# Patient Record
Sex: Female | Born: 1960 | Race: White | Hispanic: No | Smoking: Never smoker
Health system: Southern US, Community
[De-identification: ages and names within clinical notes are randomized; demographics above are authoritative.]

## PROBLEM LIST (undated history)

## (undated) DIAGNOSIS — I10 Essential (primary) hypertension: Secondary | ICD-10-CM

## (undated) DIAGNOSIS — K1379 Other lesions of oral mucosa: Secondary | ICD-10-CM

## (undated) DIAGNOSIS — E119 Type 2 diabetes mellitus without complications: Secondary | ICD-10-CM

## (undated) DIAGNOSIS — E0500 Thyrotoxicosis with diffuse goiter without thyrotoxic crisis or storm: Secondary | ICD-10-CM

## (undated) DIAGNOSIS — M329 Systemic lupus erythematosus, unspecified: Secondary | ICD-10-CM

## (undated) DIAGNOSIS — F329 Major depressive disorder, single episode, unspecified: Secondary | ICD-10-CM

## (undated) DIAGNOSIS — K859 Acute pancreatitis without necrosis or infection, unspecified: Secondary | ICD-10-CM

## (undated) DIAGNOSIS — R011 Cardiac murmur, unspecified: Secondary | ICD-10-CM

## (undated) DIAGNOSIS — F32A Depression, unspecified: Secondary | ICD-10-CM

## (undated) DIAGNOSIS — M797 Fibromyalgia: Secondary | ICD-10-CM

## (undated) DIAGNOSIS — K589 Irritable bowel syndrome without diarrhea: Secondary | ICD-10-CM

## (undated) DIAGNOSIS — N200 Calculus of kidney: Secondary | ICD-10-CM

## (undated) DIAGNOSIS — M1990 Unspecified osteoarthritis, unspecified site: Secondary | ICD-10-CM

## (undated) DIAGNOSIS — G2581 Restless legs syndrome: Secondary | ICD-10-CM

## (undated) DIAGNOSIS — G4730 Sleep apnea, unspecified: Secondary | ICD-10-CM

## (undated) DIAGNOSIS — E7800 Pure hypercholesterolemia, unspecified: Secondary | ICD-10-CM

## (undated) DIAGNOSIS — K219 Gastro-esophageal reflux disease without esophagitis: Secondary | ICD-10-CM

## (undated) DIAGNOSIS — E039 Hypothyroidism, unspecified: Secondary | ICD-10-CM

## (undated) DIAGNOSIS — IMO0002 Reserved for concepts with insufficient information to code with codable children: Secondary | ICD-10-CM

## (undated) HISTORY — PX: CHOLECYSTECTOMY: SHX55

## (undated) HISTORY — DX: Thyrotoxicosis with diffuse goiter without thyrotoxic crisis or storm: E05.00

## (undated) HISTORY — DX: Sleep apnea, unspecified: G47.30

## (undated) HISTORY — DX: Irritable bowel syndrome, unspecified: K58.9

---

## 2001-12-17 ENCOUNTER — Emergency Department (HOSPITAL_COMMUNITY): Admission: EM | Admit: 2001-12-17 | Discharge: 2001-12-18 | Payer: Self-pay

## 2001-12-18 ENCOUNTER — Encounter: Payer: Self-pay | Admitting: Emergency Medicine

## 2001-12-18 ENCOUNTER — Emergency Department (HOSPITAL_COMMUNITY): Admission: EM | Admit: 2001-12-18 | Discharge: 2001-12-19 | Payer: Self-pay | Admitting: Emergency Medicine

## 2002-01-29 ENCOUNTER — Other Ambulatory Visit: Admission: RE | Admit: 2002-01-29 | Discharge: 2002-01-29 | Payer: Self-pay | Admitting: Family Medicine

## 2002-01-30 ENCOUNTER — Other Ambulatory Visit: Admission: RE | Admit: 2002-01-30 | Discharge: 2002-01-30 | Payer: Self-pay | Admitting: Family Medicine

## 2002-11-07 HISTORY — PX: LAPAROSCOPIC CHOLECYSTECTOMY W/ CHOLANGIOGRAPHY: SUR757

## 2003-03-24 ENCOUNTER — Other Ambulatory Visit: Admission: RE | Admit: 2003-03-24 | Discharge: 2003-03-24 | Payer: Self-pay | Admitting: Family Medicine

## 2003-05-21 ENCOUNTER — Encounter: Payer: Self-pay | Admitting: Family Medicine

## 2003-05-21 ENCOUNTER — Ambulatory Visit (HOSPITAL_COMMUNITY): Admission: RE | Admit: 2003-05-21 | Discharge: 2003-05-21 | Payer: Self-pay | Admitting: Family Medicine

## 2003-06-15 ENCOUNTER — Emergency Department (HOSPITAL_COMMUNITY): Admission: EM | Admit: 2003-06-15 | Discharge: 2003-06-15 | Payer: Self-pay | Admitting: Emergency Medicine

## 2003-06-15 ENCOUNTER — Encounter: Payer: Self-pay | Admitting: Emergency Medicine

## 2003-07-07 ENCOUNTER — Inpatient Hospital Stay (HOSPITAL_COMMUNITY): Admission: EM | Admit: 2003-07-07 | Discharge: 2003-07-12 | Payer: Self-pay | Admitting: Emergency Medicine

## 2003-07-07 ENCOUNTER — Encounter: Payer: Self-pay | Admitting: Internal Medicine

## 2003-07-07 ENCOUNTER — Encounter: Payer: Self-pay | Admitting: Emergency Medicine

## 2003-07-11 ENCOUNTER — Encounter (INDEPENDENT_AMBULATORY_CARE_PROVIDER_SITE_OTHER): Payer: Self-pay | Admitting: *Deleted

## 2003-07-11 ENCOUNTER — Encounter: Payer: Self-pay | Admitting: General Surgery

## 2003-07-15 ENCOUNTER — Encounter: Admission: RE | Admit: 2003-07-15 | Discharge: 2003-07-15 | Payer: Self-pay | Admitting: Family Medicine

## 2003-11-08 HISTORY — PX: BILATERAL SALPINGOOPHORECTOMY: SHX1223

## 2003-11-08 HISTORY — PX: SUPRACERVICAL ABDOMINAL HYSTERECTOMY: SHX5393

## 2004-01-02 ENCOUNTER — Emergency Department (HOSPITAL_COMMUNITY): Admission: EM | Admit: 2004-01-02 | Discharge: 2004-01-03 | Payer: Self-pay

## 2004-04-08 ENCOUNTER — Other Ambulatory Visit: Admission: RE | Admit: 2004-04-08 | Discharge: 2004-04-08 | Payer: Self-pay | Admitting: Family Medicine

## 2004-06-22 ENCOUNTER — Inpatient Hospital Stay (HOSPITAL_COMMUNITY): Admission: RE | Admit: 2004-06-22 | Discharge: 2004-06-25 | Payer: Self-pay | Admitting: Obstetrics & Gynecology

## 2004-09-10 ENCOUNTER — Emergency Department (HOSPITAL_COMMUNITY): Admission: EM | Admit: 2004-09-10 | Discharge: 2004-09-10 | Payer: Self-pay | Admitting: Emergency Medicine

## 2004-11-07 HISTORY — PX: COLONOSCOPY: SHX174

## 2004-12-21 ENCOUNTER — Emergency Department (HOSPITAL_COMMUNITY): Admission: EM | Admit: 2004-12-21 | Discharge: 2004-12-21 | Payer: Self-pay | Admitting: Emergency Medicine

## 2004-12-30 ENCOUNTER — Ambulatory Visit (HOSPITAL_COMMUNITY): Admission: RE | Admit: 2004-12-30 | Discharge: 2004-12-30 | Payer: Self-pay | Admitting: Family Medicine

## 2005-01-26 ENCOUNTER — Ambulatory Visit: Payer: Self-pay | Admitting: Internal Medicine

## 2005-04-25 ENCOUNTER — Ambulatory Visit: Payer: Self-pay | Admitting: Internal Medicine

## 2005-04-28 ENCOUNTER — Encounter: Payer: Self-pay | Admitting: Internal Medicine

## 2005-04-28 ENCOUNTER — Ambulatory Visit: Payer: Self-pay | Admitting: Internal Medicine

## 2005-04-28 ENCOUNTER — Ambulatory Visit (HOSPITAL_COMMUNITY): Admission: RE | Admit: 2005-04-28 | Discharge: 2005-04-28 | Payer: Self-pay | Admitting: Internal Medicine

## 2005-06-13 ENCOUNTER — Ambulatory Visit: Payer: Self-pay | Admitting: Internal Medicine

## 2005-10-10 ENCOUNTER — Ambulatory Visit: Payer: Self-pay | Admitting: Internal Medicine

## 2005-11-25 ENCOUNTER — Ambulatory Visit: Payer: Self-pay | Admitting: Internal Medicine

## 2006-01-05 ENCOUNTER — Ambulatory Visit (HOSPITAL_COMMUNITY): Admission: RE | Admit: 2006-01-05 | Discharge: 2006-01-05 | Payer: Self-pay | Admitting: Family Medicine

## 2006-11-21 ENCOUNTER — Ambulatory Visit: Payer: Self-pay | Admitting: Internal Medicine

## 2007-06-04 ENCOUNTER — Ambulatory Visit: Payer: Self-pay | Admitting: Internal Medicine

## 2007-06-17 ENCOUNTER — Emergency Department (HOSPITAL_COMMUNITY): Admission: EM | Admit: 2007-06-17 | Discharge: 2007-06-17 | Payer: Self-pay | Admitting: Emergency Medicine

## 2008-05-27 ENCOUNTER — Ambulatory Visit: Payer: Self-pay | Admitting: Internal Medicine

## 2008-08-22 ENCOUNTER — Ambulatory Visit: Payer: Self-pay | Admitting: Gastroenterology

## 2008-11-21 ENCOUNTER — Encounter: Payer: Self-pay | Admitting: Urgent Care

## 2008-11-21 LAB — CONVERTED CEMR LAB
ALT: 33 units/L (ref 0–35)
AST: 32 units/L (ref 0–37)
Alkaline Phosphatase: 94 units/L (ref 39–117)
Bilirubin, Direct: 0.2 mg/dL (ref 0.0–0.3)
Indirect Bilirubin: 0.4 mg/dL (ref 0.0–0.9)

## 2008-12-10 ENCOUNTER — Encounter (HOSPITAL_COMMUNITY): Admission: RE | Admit: 2008-12-10 | Discharge: 2009-01-09 | Payer: Self-pay | Admitting: Family Medicine

## 2008-12-10 ENCOUNTER — Ambulatory Visit: Payer: Self-pay | Admitting: Internal Medicine

## 2009-02-11 ENCOUNTER — Encounter (HOSPITAL_COMMUNITY): Admission: RE | Admit: 2009-02-11 | Discharge: 2009-03-13 | Payer: Self-pay | Admitting: Obstetrics and Gynecology

## 2009-04-08 ENCOUNTER — Emergency Department (HOSPITAL_COMMUNITY): Admission: EM | Admit: 2009-04-08 | Discharge: 2009-04-08 | Payer: Self-pay | Admitting: Emergency Medicine

## 2009-07-29 DIAGNOSIS — K7689 Other specified diseases of liver: Secondary | ICD-10-CM

## 2009-07-29 DIAGNOSIS — K6289 Other specified diseases of anus and rectum: Secondary | ICD-10-CM

## 2009-07-29 DIAGNOSIS — K219 Gastro-esophageal reflux disease without esophagitis: Secondary | ICD-10-CM | POA: Insufficient documentation

## 2009-07-29 DIAGNOSIS — E0590 Thyrotoxicosis, unspecified without thyrotoxic crisis or storm: Secondary | ICD-10-CM | POA: Insufficient documentation

## 2009-07-29 DIAGNOSIS — R634 Abnormal weight loss: Secondary | ICD-10-CM

## 2009-07-29 DIAGNOSIS — Z8719 Personal history of other diseases of the digestive system: Secondary | ICD-10-CM | POA: Insufficient documentation

## 2009-09-19 ENCOUNTER — Emergency Department (HOSPITAL_COMMUNITY): Admission: EM | Admit: 2009-09-19 | Discharge: 2009-09-19 | Payer: Self-pay | Admitting: Emergency Medicine

## 2009-09-19 ENCOUNTER — Emergency Department (HOSPITAL_COMMUNITY): Admission: EM | Admit: 2009-09-19 | Discharge: 2009-09-19 | Payer: Self-pay | Admitting: Family Medicine

## 2010-02-05 ENCOUNTER — Encounter: Payer: Self-pay | Admitting: Urgent Care

## 2010-03-15 ENCOUNTER — Encounter: Payer: Self-pay | Admitting: Urgent Care

## 2010-03-17 ENCOUNTER — Encounter: Payer: Self-pay | Admitting: Gastroenterology

## 2010-03-18 ENCOUNTER — Encounter: Payer: Self-pay | Admitting: Gastroenterology

## 2010-04-27 ENCOUNTER — Encounter: Payer: Self-pay | Admitting: Internal Medicine

## 2010-07-24 ENCOUNTER — Encounter: Admission: RE | Admit: 2010-07-24 | Discharge: 2010-07-24 | Payer: Self-pay | Admitting: Family Medicine

## 2010-12-07 NOTE — Letter (Signed)
[facility]   Imported By: Rosine Beat 04/27/2010 08:21:55  _____________________________________________________________________  External Attachment:    Type:   Image     Comment:   External Document

## 2010-12-07 NOTE — Medication Information (Signed)
Summary: RX Folder  RX Folder   Imported By: Diana Eves 03/15/2010 13:15:57  _____________________________________________________________________  External Attachment:    Type:   Image     Comment:   External Document  Appended Document: RX Folder denied.  needs ov or get from pcp.

## 2010-12-07 NOTE — Medication Information (Signed)
Summary: Tax adviser   Imported By: Diana Eves 02/05/2010 08:43:58  _____________________________________________________________________  External Attachment:    Type:   Image     Comment:   External Document  Appended Document: RX FolderOmeprazole    Prescriptions: OMEPRAZOLE 20 MG TBEC (OMEPRAZOLE) once daily  #31 x 0   Entered and Authorized by:   Joselyn Arrow FNP-BC   Signed by:   Joselyn Arrow FNP-BC on 02/08/2010   Method used:   [facility]        [facility]  [place] [address]       [address] [place] [address]       [place], [place]  [address]       Ph: [phone] or [phone]       Fax: [phone]   RxID:   [id number]  PT NEEDS OV prior to further RFs.   Appended Document: RX Folder Called pt. Home number disconnected. Called pharmacist, Selena Batten, and she said she will let her know.

## 2010-12-07 NOTE — Medication Information (Signed)
Summary: Tax adviser   Imported By: Diana Eves 03/18/2010 15:21:26  _____________________________________________________________________  External Attachment:    Type:   Image     Comment:   External Document  Appended Document: RX Folder PLEASE LET PHARMACY KNOW THAT THIS WILL BE DENIED UNTIL PATIENT IS SEEN IN OFFICE. NO NEED TO SEND FURTHER REQUEST.  Appended Document: RX Folder pharmacy aware

## 2010-12-07 NOTE — Medication Information (Signed)
Summary: RX Folder  RX Folder   Imported By: Peggyann Shoals 03/17/2010 09:24:03  _____________________________________________________________________  External Attachment:    Type:   Image     Comment:   External Document  Appended Document: RX Folder denied per kj

## 2011-02-09 LAB — URINALYSIS, ROUTINE W REFLEX MICROSCOPIC
Bilirubin Urine: NEGATIVE
Specific Gravity, Urine: 1.023 (ref 1.005–1.030)
Urobilinogen, UA: 0.2 mg/dL (ref 0.0–1.0)

## 2011-02-09 LAB — POCT URINALYSIS DIP (DEVICE)
Bilirubin Urine: NEGATIVE
Nitrite: NEGATIVE
Urobilinogen, UA: 0.2 mg/dL (ref 0.0–1.0)
pH: 5.5 (ref 5.0–8.0)

## 2011-02-09 LAB — URINE MICROSCOPIC-ADD ON

## 2011-02-09 LAB — URINE CULTURE

## 2011-02-14 LAB — URINALYSIS, ROUTINE W REFLEX MICROSCOPIC
Bilirubin Urine: NEGATIVE
Glucose, UA: NEGATIVE mg/dL
Hgb urine dipstick: NEGATIVE
Ketones, ur: NEGATIVE mg/dL
Nitrite: NEGATIVE
pH: 5.5 (ref 5.0–8.0)

## 2011-02-14 LAB — COMPREHENSIVE METABOLIC PANEL
ALT: 23 U/L (ref 0–35)
AST: 22 U/L (ref 0–37)
Albumin: 3.8 g/dL (ref 3.5–5.2)
CO2: 29 mEq/L (ref 19–32)
Chloride: 102 mEq/L (ref 96–112)
GFR calc Af Amer: >60 mL/min (ref >60)
GFR calc non Af Amer: >60 mL/min (ref >60)
Potassium: 3.5 mEq/L (ref 3.5–5.1)
Sodium: 138 mEq/L (ref 135–145)
Total Bilirubin: 0.4 mg/dL (ref 0.3–1.2)

## 2011-02-14 LAB — DIFFERENTIAL
Basophils Absolute: 0 10*3/uL (ref 0.0–0.1)
Eosinophils Absolute: 0.2 10*3/uL (ref 0.0–0.7)
Eosinophils Relative: 2 % (ref 0–5)
Lymphs Abs: 1.7 10*3/uL (ref 0.7–4.0)
Monocytes Absolute: 0.7 10*3/uL (ref 0.1–1.0)

## 2011-02-14 LAB — URINE CULTURE: Colony Count: 55000

## 2011-02-14 LAB — LIPASE, BLOOD: Lipase: 23 U/L (ref 11–59)

## 2011-02-14 LAB — CBC
RBC: 4.64 MIL/uL (ref 3.87–5.11)
WBC: 6.8 10*3/uL (ref 4.0–10.5)

## 2011-03-22 NOTE — Assessment & Plan Note (Signed)
NAMEMarland Kitchen  KASI, REDDICKS                 CHART#:  [id number]   DATE:  06/04/2007                       DOB:  1961-04-13   CHIEF COMPLAINT:  Followup GERD/fatty liver, proctitis.   SUBJECTIVE:  Ms. Whitling is a 50 year old Caucasian female who has been  followed for above.  She was last seen on November 21, 2006. She has a  history of GERD, which is well controlled on b.i.d. omeprazole.  She  also has history of alternating constipation and diarrhea.  She tried  Investment [employer], [employer] for about four months.  This did seem to work pretty good for  her.  She did try MiraLax for her alternating constipation but this  caused abdominal pain.  She  tried it for a week and discontinued it.  She is on prednisone 10 mg daily for her SLE.  She has diarrhea  generally when she goes out to eat within a few minutes of eating.  She  has noticed intermittent small volume hematochezia suspected to be due  to her hemorrhoids.  She usually only sees this when she has a hard  stool.  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   She tells me today that she believes her father has a history of colon  cancer.  She also thinks he had bone cancer.  She tells me she thinks he  was diagnosed in his 21's, however, she is adopted and does not have a  lot of information.  She is asked today to try to find out more  information regarding this.   CURRENT MEDICATIONS:  See the list of June 04, 2007.   ALLERGIES:  No known drug allergies.   PHYSICAL EXAMINATION:  VITAL SIGNS:  Weight 303 pounds.  Height 65  inches.  Temperature 98.5.  Blood pressure 138/80, pulse 88.  GENERAL APPEARANCE:  Ms. Mees is an obese Caucasian female who is  alert, oriented, pleasant, cooperative and in no acute distress.  HEENT:  Conjunctivae pink.  Sclerae clear, nonicteric.  Oropharynx pink  and moist without any lesions.  CHEST/HEART:  Regular rate and rhythm with normal S1, S2.  ABDOMEN:  Protuberant with positive bowel  sounds x4.  Obese, soft,  nontender, nondistended without palpable masses or hepatosplenomegaly.  Exam is limited due to the patient's body habitus.  EXTREMITIES:  Without trace lower extremity edema bilaterally.  SKIN:  Pink, warm and dry without any rashes or jaundice.   ASSESSMENT:  Ms. Zebell is a 50 year old female with chronic  gastroesophageal reflux disease, well controlled on b.i.d. proton pump  inhibitor.  She has a history of fatty liver.  She is due for liver  function tests.  She has history of proctitis in remission.  She has  constipation alternating with diarrhea, suspect this is due to irritable  bowel syndrome.   PLAN:  1. Liver function tests.  2. She is going to see if she can get any further information      regarding her father's potential past medical history of colon      cancer to determine whether she is going to need five or ten year      surveillance.  3. She is to continue either the Fiber Choice or Benefiber for her      constipation.  4. Colace stool  softeners one to two daily as needed.  5. IBS literature is given for her review as well as a fiber wheel.       Lorenza Burton, N.P.  Electronically Signed     R. Roetta Sessions, M.D.  Electronically Signed    KJ/MEDQ  D:  06/04/2007  T:  06/05/2007  Job:  [id number]   cc:   Ernestina Penna, M.D.

## 2011-03-22 NOTE — Assessment & Plan Note (Signed)
NAMEMarland Kitchen  [name]                 CHART#:  [id number]   DATE:  08/22/2008                       DOB:  [birth year]   PRIMARY CARE PHYSICIAN:  Carol Penna, MD   RHEUMATOLOGIST:  [name]   PROBLEM LIST:  1. Fatty liver.  2. History of proctitis.  3. History of irritable bowel syndrome.  4. Unintentional weight loss in the setting of morbid obesity.  5. Gastroesophageal reflux disease.   SUBJECTIVE:  The patient is a 50 year old morbidly obese Caucasian  female.  She presents today for followup.  She is taking omeprazole 20  mg daily and doing well.  She is having anywhere from 3-4 bowel  movements per day.  Her IBS is stable.  She has not been taking  probiotics, but states that she may try this.  She is followed by [name] for her lupus and fibromyalgia.  She tells me she has been  shaky for about a week now.  She has had some anxiety.  She tells me  she has lost about 20 pounds and we do have this documented over the  last 3 months.  She has been noting some fatigue.  She has not  intentionally tried to lose weight or increased her activity.  She has  had some shortness of breath on exertion as well.  She has not had any  cough with fever or chills.   CURRENT MEDICATIONS:  See the list from August 22, 2008.   ALLERGIES:  No known drug allergies.   OBJECTIVE:  VITAL SIGNS:  Weight 298.5 pounds, height 64 inches,  temperature 98.2, blood pressure 132/88, and pulse is 96.GENERAL:  She  is a morbidly obese Caucasian female, who is alert, oriented, pleasant,  and cooperative, in no acute distress.HEENT:  Sclerae clear, nonicteric.  Conjunctivae pink.  Oropharynx pink and moist without any lesions.NECK:  Supple without mass or thyromegaly.CHEST:  Heart, regular rate and  rhythm.  Normal S1 and S2 without murmurs, clicks, rubs, or gallops.  ABDOMEN:  Protuberant.  Positive bowel sounds x4.  No bruits  auscultated.  Soft, nontender, nondistended without palpable mass  or  hepatosplenomegaly.  No rebound, tenderness, or guarding.EXTREMITIES:  Without clubbing or edema.SKIN:  Pink, warm, and dry.   Laboratory studies from August 15, 2008, show an ALT of 45.  All other  LFTs normal.   ASSESSMENT:  1. Fatty liver with mildly elevated ALT.  2. Irritable bowel syndrome, very well controlled.  3. Unintentional weight loss in the setting of morbid obesity with      constitutional symptoms including anxiety and shakiness.  We will      refer patient back to primary care physician and a rheumatologist,      however, we will check some baseline labs today.  4. Gastroesophageal reflux disease, well controlled.  5. Morbid obesity.   PLAN:  1. Omeprazole 20 mg daily, #31 with 11 refills.  2. TSH, MET-7, CBC.  3. Dietician referral for morbid obesity.  4. Probiotic of choice once daily.  5. Follow up in 3 months with Dr. Jena Vang with LFTs prior to our office      visit.  6. She should make an appointment with [facility]      [facility] to discuss her shaking spells and anxiety, and she is  urged to go to the emergency room, of course, if they get worse      over the weekend.       [name], N.P.  Electronically Signed     [name], M.D.  Electronically Signed    KJ/MEDQ  D:  08/22/2008  T:  08/23/2008  Job:  [id number]   cc:   [name], M.D.  [name]

## 2011-03-22 NOTE — Assessment & Plan Note (Signed)
NAMEMarland Kitchen  RAFAELLA, KOLE                 CHART#:  [id number]   DATE:  12/10/2008                       DOB:  13-Apr-1961   FOLLOWUP:  History of elevated LFTs, markedly depressed TSH (0.006),  history of biopsy proven proctitis on prior colonoscopy, irritable bowel  syndrome, and gastroesophageal reflux disease; last seen on 08/22/2008.  Since 08/25/2008, she has lost 17.5 pounds.  She was found to be  hyperthyroid based on markedly depressed TSH.  She is now seeing Dr.  Talmage Nap, Endocrinologist in [place].  She is having a nuclear thyroid  uptake soon/workup in progress.  She may end up with I-131 ablation.   Her bowels, she is having no lower GI tract symptoms at this time.  She  does have aside from some increased gas, she has 2-3 bowel movements  weekly, occasionally is constipated, but otherwise she is really not  having any diarrhea or rectal bleeding.  Gastroesophageal reflux disease  symptoms are well controlled on Prilosec 20 mg orally daily.  LFTs from  11/24/2008, came back completely normal.  We did prescribe Align  previously and she went and purchased the agent, but did not take it.  Her other medical problems include SLE.   CURRENT MEDICATIONS:  See updated list.   ALLERGIES:  No known drug allergies.   PHYSICAL EXAMINATION:  GENERAL:  Today, a pleasant 50 year old lady  resting comfortably.  VITAL SIGNS:  Weight 271, height 5 feet 4 inches, temp 98.3, BP 130/80,  and pulse 72.  SKIN:  Warm and dry.  CHEST:  Lungs are clear to auscultation.  CARDIAC:  Regular rate and rhythm without murmur, gallop, or rub.  ABDOMEN:  Obese, positive bowel sounds, soft, and nontender.  No  appreciable mass or hepatosplenomegaly.   ASSESSMENT:  1. Gastroesophageal reflux disease symptoms are well controlled on      Prilosec.  We will continue that regimen.  2. Recent weight loss related to hyperthyroidism.  She will get her      thyroid status normalized and this will be of a great  benefit to      her overall.  3. History of proctitis, appears to be a self-limited process.  Now,      she is relatively constipated.  With increased flatulence, I have      recommended that she try some Align one capsule daily, may also      incorporate a stool softener like Colace 100 mg orally b.i.d. into      her regimen and use MiraLax 17 g orally at bedtime p.r.n.      constipation.  4. Probable fatty liver with mildly elevated aminotransferases, now      normalized.  Screening      colonoscopy in 10 years from now.  5. Followup with [place] in the office to see how things are going on in 6      months.       Jonathon Bellows, M.D.  Electronically Signed     RMR/MEDQ  D:  12/10/2008  T:  12/10/2008  Job:  [id number]   cc:   [facility]

## 2011-03-22 NOTE — Assessment & Plan Note (Signed)
NAMEMarland Kitchen  MAURIANA, DANN                 CHART#:  [id number]   DATE:  05/27/2008                       DOB:  10-23-1961   Followup regarding the fatty liver, history of proctitis, and IBS.  Last  seen on 06/04/07.  She was felt to have some element of constipation at  that time.  She was started on some Colace and fiber supplement.  LFTs  came back entirely normal.  She really states she has had far and away  predominance of diarrhea, postprandial abdominal cramps, and diarrhea,  particularly when eating out.  She has gained weight since she was last  seen, which has gone from 303 to 317.  She was 350 back in 2007.  She is  no longer taking Naprosyn.  Reflux symptoms are well controlled on  omeprazole.  She had some mild proctitis in 2006 TCS.   FAMILY HISTORY:  There is no family history of colon cancer, although  she is adopted.  She is going to and try to get some more information  later this summer when she goes back to see her family in [place].  She is not passing any blood per rectum these days.   CURRENT MEDICATIONS:  See updated list.   ALLERGIES:  No known drug allergies.   PHYSICAL EXAMINATION:  GENERAL:  Today, appears well.  VITAL SIGNS:  Weight 317, height 5 feet 4 inches, temperature 98.1, BP  120/80, and pulse 88.  SKIN:  Warm and dry.  ABDOMEN:  Obese.  Positive bowel sounds.  Soft and nontender without  appreciable mass or organomegaly.   ASSESSMENT:  1. Fatty liver.  Normal liver function tests.  She has been able to      lose some weight, but overall not that much as far from that amount      is concerned.  Reflux symptoms well controlled.  2. Intermittent postprandial abdominal cramps and diarrhea, most      consistent with irritable bowel syndrome.   RECOMMENDATIONS:  1. Continue omeprazole 20 mg orally daily for reflux disease.  2. Followup here with LFTs in 3 months.  3. Begin probiotic in way of Align 1 capsule daily.  She will let us      know in 1  month how she is doing, #30 samples provided.  Followup      appointment here in 3 months with LFTs.  As far as further      management of diarrhea goes, I have recommended she simply try some      Imodium A-D and if she knows she is at a high risk to have an      attack when she goes out to eat, just take 1 preemptively.   I have asked her to stop fiber supplementation as there would be limited  efficacy in this setting.       Jonathon Bellows, M.D.  Electronically Signed     RMR/MEDQ  D:  05/27/2008  T:  05/27/2008  Job:  [id number]   cc:   Ernestina Penna, M.D.

## 2011-03-25 NOTE — H&P (Signed)
[name], [name]                            ACCOUNT NO.:  [id number]   MEDICAL RECORD NO.:  [id number]                   PATIENT TYPE:  INP   LOCATION:  1845                                 FACILITY:  [facility]   PHYSICIAN:  Jonah Blue, M.D.                DATE OF BIRTH:  04-08-1961   DATE OF ADMISSION:  DATE OF DISCHARGE:                                HISTORY & PHYSICAL   PRIMARY MD:  Dr. Montey Hora from [facility]   CHIEF COMPLAINT:  Abdominal pain x2 days.   HISTORY OF PRESENT ILLNESS:  The patient is a 50 year old white female with  a history of lupus and a recent kidney stone.  She presents with a 2-day  history of abdominal pain predominately bilaterally in the upper quadrant  that radiates to her back as well as up her sternum.  The pain is constant  but worse with coughing and movement.  Morphine, that she received in the  ER, helped minimally.  She did vomit on Saturday but not since. She denies  diarrhea or bowel movement since the vomiting.  She has had subjective  fevers, chills and sweats with pain.  Pain is localized to the epigastrium.  She also complains of thirst and hunger, and has not had anything to eat or  drink today; and has had minimal to eat since the onset of the pain on  Saturday.   PAST MEDICAL HISTORY:  1. Lupus x5 years.  2. Kidney stone 1 month ago.  3. Secondary amenorrhea.   MEDICATIONS:  1. Plaquenil.  2. Paxil.  3. Uniretic 7.5/12.5.  4. Ambien.  5. Naprosyn.   ALLERGIES:  No known drug allergies.   SOCIAL HISTORY:  The patient lives with her husband and 2 children ages 56  and 54.  She denies alcohol, tobacco, or drugs. She works intermittently and  part-time at [employer].  She was adopted as a child.  She has a good  relationship with her foster parents and feels safe at home with her  husband.   FAMILY HISTORY:  Mother died at 41 from complications of hip surgery and  alcohol abuse.  Father died at  29 from bone cancer.   REVIEW OF SYSTEMS:  Positive for headache, cough, shortness of breath, chest  pain.  The chest pain and shortness of breath she feels are secondary to her  lupus.  Negative for palpitations, dysuria, arthralgia, vision changes,  dysphagia, numbness, weakness, or tingling.   PHYSICAL EXAMINATION:  VITAL SIGNS:  Temperature 98.7, respirations 20,  pulse 90, blood pressure 121/68.  Saturation 98% on room air.  GENERAL:  The patient is obese, but pleasant, jovial, and in no acute  distress.  HEENT:  PERRLA.  EOMI.  Moist mucous membranes.  Oropharynx without erythema  or edema.  NECK:  Supple.  No lymphadenopathy.  CARDIOVASCULAR:  Regular rate and  rhythm with distant heart sounds.  LUNGS:  Clear to auscultation bilaterally.  ABDOMEN:  Morbidly obese.  Tender to palpation in the bilateral upper  quadrants, but left greater than right.  Hypoactive bowel sounds.  EXTREMITIES:  Lower extremities obese, no calf tenderness.   LABS:  WBC 8.2, hemoglobin 13.8, platelets 202 with 75% neutrophils and 15%  lymphocytes.  Urine hemoglobin is positive.  Urine pregnancy is negative.  UA: Specific gravity 1.017, small bilirubin, 15 ketone, trace LE, negative  nitrites, 3-6 wbc's, positive yeast.  Lipase 590, amylase 799.  Sodium 138,  potassium 3.5, chloride 105, CO2 25, BUN 14, creatinine 0.6, glucose 98,  calcium 8.7, bilirubin 1.1, alkaline phosphatase 175, AST 136, ALT 227,  total protein 6.6, albumin 3.6.  Abdominal CT shows gas versus gallbladder  stones with a 3-cm, complex, right adnexal cyst.   ASSESSMENT/PLAN:  A 50 year old white female with pancreatitis.  1. Pancreatitis.  Admit to inpatient.  Keep NPO plan for a right upper     quadrant ultrasound to further delineate stones.  [place] and [place]     criteria indicate a low morbidity and mortality risk of 3 to 4%.  The     patient does not need an NGT at this time.  Likely will need a     cholecystectomy.  Will try to  avoid emergent surgery in this obese     physical therapy, however.  Dr. Gerrit Friends to consult.  Per nursing report he     agrees with delay of surgery at this time.  Morphine for pain. Phenergan     p.r.n. nausea and vomiting.  2. Lupus has been well controlled with current regimen.  We will hold meds     for now, as the patient is NPO.  Hopefully we will be able to resume them     soon. In the meantime we need to watch for signs and symptoms of     withdrawal from her SSR especially.  3. Fluids, electrolytes, nutrition (FEN).  The patient does not appear     clinically dehydrated.  We will run IV fluids at maintenance.  No     electrolyte abnormalities at this time.  No known diabetes mellitus.  4. Kidney stones.  Recent, sounding like unrelated to the current issue,     will follow.  5. Right ovarian cyst. Suggest follow up as outpatient.  6. Amenorrhea.  The patient has been on oral contraceptive pills for quite     some time. She may need an endometrial biopsy as and outpatient.  Also     consider menopause.  Could check a follicle-stimulating hormone     (FSH) and luteinizing hormone (LH) and PCOS.  7. Vaginal Candidiasis .  Treat once clinically able to take p.o. The     patient was recently diagnosed as an outpatient with Candidiasis as well     and likely needs Diflucan 150 mg p.o. daily x2 days.                                                Jonah Blue, M.D.    Milas Gain  D:  07/07/2003  T:  07/07/2003  Job:  [id number]   cc:   Magnus Sinning. Dimple Casey, M.D.  [address] [place]  [place] [address]  Fax: [phone]   Velora Heckler,  M.D.  [address] [place]  [place] [address]  Fax: [phone]

## 2011-03-25 NOTE — Consult Note (Signed)
[name], [name]                            ACCOUNT NO.:  [id number]   MEDICAL RECORD NO.:  [id number]                   PATIENT TYPE:  INP   LOCATION:  5705                                 FACILITY:  [facility]   PHYSICIAN:  Velora Heckler, M.D.                DATE OF BIRTH:  [birth year]   DATE OF CONSULTATION:  07/07/2003  DATE OF DISCHARGE:                                   CONSULTATION   REFERRING PHYSICIAN:  Family Practice Teaching Service, Dr. Leighton Roach.  McDiarmid, attending.   REASON FOR CONSULTATION:  Abdominal pain, pancreatitis.   HISTORY OF PRESENT ILLNESS:  The patient is a 50 year old white female  admitted from the emergency department at [facility] with two-day  history of abdominal pain and nausea.  The patient describes epigastric  abdominal pain radiating to the right upper quadrant and the back.  This has  gradually increased in severity.  She had a history of kidney stones in the  past.  She presented to [facility] Emergency Department for evaluation.  CT  scan of abdomen and pelvis demonstrated abnormalities of the gallbladder.  There was an increased size of the pancreas.  Laboratory studies showed  elevated lipase and amylase levels consistent with acute pancreatitis.  Liver function tests were also elevated.  Subsequent ultrasound of the  gallbladder showed numerous gallstones.  The patient was admitted on [facility] Teaching Service and general surgery was consulted.   PAST MEDICAL HISTORY:  1. History of lupus erythematosus, no steroid use in the past five years.  2. History of nephrolithiasis.  3. Status post cesarean section.   MEDICATIONS:  Plaquenil, Paxil, Uniretic, Aviane, Ambien, Naprosyn.   ALLERGIES:  None known.   SOCIAL HISTORY:  The patient is followed at [facility] by Dr. Magnus Sinning. Rice.  She lives with her husband and two  children.  She denies alcohol use.  She denies tobacco use.  She works  part-  time in a Market researcher.   FAMILY HISTORY:  Family history notable for cancer of unknown type in the  patient's mother, deceased at age 64.   REVIEW OF SYSTEMS:  Fifteen-system review notable for abdominal symptoms as  noted above.  Lupus symptoms currently quiescent.   PHYSICAL EXAMINATION:  GENERAL:  [age]-year-old moderately obese white  female in mild discomfort on ward 5700, [facility].  VITAL SIGNS:  Vital signs show temperature 98.3, pulse 75, respirations 16,  blood pressure 130/70, O2 saturation 96% on room air.  HEENT:  HEENT shows her to be normocephalic.  Sclerae are clear.  Conjunctivae are clear.  Pupils are equal and reactive.  Extraocular  movements are intact.  Dentition is fair.  Voice quality is normal.  NECK:  Neck is supple without mass.  Thyroid is normal without nodularity.  There is no lymphadenopathy.  LUNGS:  Lungs are clear  to auscultation without rales or rhonchi.  BACK:  There is no flank tenderness.  CARDIAC:  Exam shows a regular rate and rhythm without murmur.  EXTREMITIES:  Peripheral pulses are full.  ABDOMEN:  Abdomen is soft, obese.  Bowel sounds are present.  There is a  well-healed Pfannenstiel incision.  There is tenderness in the epigastrium  and right upper quadrant to deep palpation.  There is no guarding and no  rebound tenderness.  There are no palpable masses.  EXTREMITIES:  Extremities show trace edema.  NEUROLOGIC:  Neurologically, the patient is alert and oriented without focal  deficit.   LABORATORY STUDIES:  White count 8.2, hemoglobin 13.8, platelet count  202,000.  Potassium 3.5, creatinine 0.6, glucose 98.  Liver function tests  are elevated but total bilirubin is normal at 1.1.  Lipase is 590, amylase  799.   RADIOGRAPHIC STUDIES:  CT scan of abdomen and pelvis reviewed with Dr. Elie Goody.  This shows an abnormal-appearing gallbladder.  There is one spot of  air within the gallbladder which Dr. Chestine Spore feels is  within a gallstone.  There is no pneumatosis of the gallbladder wall.  Pancreas is generous,  consistent with pancreatitis.   Ultrasound of the abdomen demonstrated multiple gallstones.   IMPRESSION:  1. Biliary pancreatitis.  2. Cholelithiasis.  3. Lupus erythematosus.   PLAN:  1. N.p.o. except for ice chips and sips, intravenous hydration.  2. Repeat laboratory studies in a.m.  3. May need gastroenterology consultation for ERCP if patient does not     improve clinically.  4. Will require cholecystectomy during this admission, once pancreatitis and     abnormal liver function tests have resolved.  5. Further management by [facility] Teaching Service.                                               Velora Heckler, M.D.    TMG/MEDQ  D:  07/07/2003  T:  07/08/2003  Job:  [id number]   cc:   Leighton Roach McDiarmid, M.D.  [address] [place]  [place] [address]  Fax: [phone]

## 2011-03-25 NOTE — Op Note (Signed)
NAME:  [name], [name]                        ACCOUNT NO.:  [id number]   MEDICAL RECORD NO.:  [id number]                   PATIENT TYPE:  AMB   LOCATION:  DAY                                  FACILITY:  [facility]   PHYSICIAN:  Lazaro Arms, M.D.                DATE OF BIRTH:  06/01/1961   DATE OF PROCEDURE:  06/22/2004  DATE OF DISCHARGE:                                 OPERATIVE REPORT   PREOPERATIVE DIAGNOSES:  1. Menometrorrhagia.  2. Dysmenorrhea.  3. Dyspareunia.  4. Chronic pelvic pain.   POSTOPERATIVE DIAGNOSES:  1. Menometrorrhagia.  2. Dysmenorrhea.  3. Dyspareunia.  4. Chronic pelvic pain.   PROCEDURE:  Supracervical hysterectomy with bilateral salpingo-oophorectomy.   SURGEON:  Lazaro Arms, M.D.   ANESTHESIA:  General endotracheal.   FINDINGS:  The patient had a right ovary and tube stuck to the  posterolateral uterus and pelvic sidewall.  Her bladder was densely adherent  high on the uterus, really all the way up the fundus.  The left adnexa was  pretty normal.  The ovaries themselves appeared to be normal, and there was  also what appeared to be a myoma in the left broad ligament.   DESCRIPTION OF PROCEDURE:  The patient was taken to the operating room and  placed in the supine position where she underwent general endotracheal  anesthesia.  The vagina was prepped, and a Foley catheter was placed.  The  abdomen was prepped in the usual fashion.   A Pfannenstiel skin incision was made and carried down sharply to the rectus  fascia.  The fascia was scored in the midline and extended laterally.  It  was taken off of the muscle superiorly and inferiorly without difficulty.  The muscles were divided, and the peritoneal cavity was entered.  A  Bookwalter self-retaining retractor was placed, and the upper abdomen was  packed away.  The patient was placed in Trendelenburg position.  The  adhesion of the bladder was evident on inspection of the pelvis.  It was  basically folded up on top of the uterus, and it was densely adherent.  The  right tube and ovary were also adherent to the pelvic sidewall and the  posterior wall of the uterus.  The uterus was grasped in the cornu.  The  utero-ovarian ligament on the right was crossclamped, cut, and suture  ligated.  The infundibulopelvic ligament on the right was crossclamped and  cut.  The ovary and tube were removed and double suture ligated.  The utero-  ovarian ligament on the left was crossclamped, cut, and suture ligated.  The  infundibulopelvic ligament on the left was clamped, cut, and suture ligated,  and the ovary and tube were removed.  The round ligament was clamped, cut,  and suture ligated.  A great deal of attention and care was taken in trying  to get the bladder down.  I could not  get it beyond the entire cervix.  The  uterine vessels were clamped, cut, and suture ligated.  Serial pedicles were  taken medial to the uterine vessels, each pedicle being clamped, cut, and  suture ligated.  When I could not get the bladder down any further,  I sort  of did a reverse cone and removed the cervix and the uterus.  The cervix was  not removed intact, but there probably was no more than a centimeter of  cervix remaining.  The cervical stump was then closed with interrupted  Vicryl sutures.  There was good hemostasis.  The pelvis was irrigated  vigorously.  All pedicles again were found to be hemostatic.  Interceed was  placed over the cervical stump and pelvic dissection.  The Bookwalter was  removed.  The packs were removed.  All counts were correct.  The muscles  were reapproximated loosely.  The fascia was closed using running 0 PDS.  The subcutaneous tissue was made hemostatic and closed using 0 chromic.  A  subcutaneous pain pump was placed, and the skin was closed using skin  staples.   The patient tolerated the procedure well.  She experienced 250 cc of blood  loss.  She was taken to the  recovery room in good and stable condition.  All  counts were correct x3.      ___________________________________________                                            Lazaro Arms, M.D.   LHE/MEDQ  D:  06/22/2004  T:  06/22/2004  Job:  [id number]

## 2011-03-25 NOTE — H&P (Signed)
[name], [name]              ACCOUNT NO.:  [id number]   MEDICAL RECORD NO.:  [id number]          PATIENT TYPE:  AMB   LOCATION:                                FACILITY:  [facility]   PHYSICIAN:  R. Roetta Sessions, M.D. DATE OF BIRTH:  Aug 08, 1961   DATE OF ADMISSION:  DATE OF DISCHARGE:  LH                                HISTORY & PHYSICAL   CHIEF COMPLAINT:  Intermittent hematochezia.   HISTORY OF PRESENT ILLNESS:  The patient is a 50 year old morbidly obesity  lady with intermittent hematochezia for the last year or so.  She is adopted  and her family history is limited although her biological father did die of  some type of metastatic cancer.  She has not really had any change in bowel  habits. She denies constipation or diarrhea.  She has practiced occasional  anal sex until approximately one year ago and she states since that time her  hematochezia has actually worsened.  She has not had any associated  abdominal pain.  We have been seeing her for transaminitis noted over the  past six months from Mar 22, 2005.  Her last liver profile indicated a  marked improvement in her parameters - SGPT was down to 46, everything else  was normal.  Previously in March her SGOT was 79 and her SGPT was 102.   Ultrasound demonstrated a fatty liver although it was a limited study  because of her body habitus.  Serum immunoglobulins were normal.  ANA is  positive (she has lupus).  Anti-smooth muscle antibody has not been done.  Sed rate was 3.  Iron studies normal.  Hepatitis B and C markers came back  negative.  It is notable she has been trying to obtain a more healthy  weight.  She has lost from 302 pounds on January 26, 2005 down to 294-1/2  today.   PAST MEDICAL HISTORY:  Significant for SLE followed by Dr. Virgel Manifold in [place], [place].  History of depression, gastroesophageal reflux  disease, hypertension, history of gallstone pancreatitis 2004 for which she  was hospitalized at  [facility].  Status post hysterectomy and two  Cesarean section's.   CURRENT MEDICATIONS:  1.  Univasc 7.5/12.5 mg daily.  2.  Hydroxychloroquine  200 mg daily.  3.  Naproxen 500 mg daily.  4.  AcipHex 20 mg daily.  5.  Lunesta 3 mg daily PRN.  6.  Paxil 40 mg daily.   ALLERGIES:  No known drug allergies.   FAMILY HISTORY:  Father died age 54 metastatic cancer.  He was also found to  have cirrhosis secondary to alcohol.  Patient is adopted, otherwise family  history is limited.   SOCIAL HISTORY:  Patient is separated. She has two son's.  She is employed  part-time as a Lawyer and bus driver.  She denies alcohol or  drug use.  No tobacco use.   REVIEW OF SYMPTOMS:  Weight loss recently which has been secondary to a  conscious effort.  No chest pain, no dyspnea on exertion, no fever or  chills,  no yellow jaundice, clay colored stools, dark colored urine.   PHYSICAL EXAMINATION:  GENERAL APPEARANCE:  Pleasant 50 year old lady  resting comfortably.  VITAL SIGNS:  Weight 294.5, height 5 feet 4 inches, temperature 98.5, blood  pressure 130/62, pulse 78.  SKIN:  Warm and dry.  No jaundice.  No stigmata of chronic liver disease.  HEENT:  No scleral icterus.  NECK:  Jugular venous distention is not prominent.  CHEST:  Lungs are clear to auscultation.  BREAST:  Exam is deferred.  CARDIAC:  Regular rate and rhythm without murmurs, gallops, rubs.  ABDOMEN:  Massively obese, positive bowel sounds, soft, nontender, no  obvious masses or hepatosplenomegaly.  EXTREMITIES:  No edema.  RECTAL:  Examination is deferred to the time of colonoscopy.   IMPRESSION:  [name] is a 50 year old lady with intermittent  hematochezia.  She needs to have a colonoscopy and to this end I have  offered Ms. Marhefka a colonoscopy.  The potential risks, benefits and  alternatives have been reviewed.   Her transaminitis has improved dramatically with some weight loss.  I   suspect we are, in fact, dealing with a fatty liver and not any autoimmune  hepatitis or any other entity.  Recommended the importance of becoming more  physically fit, exercising 30 minutes three times weekly, and continue to  strive for a more ideal body weight.  I have encouraged her to plan on  losing 25 pounds between now and the end of the year.  Vitamin E capsules,  two 400 international units daily was also recommended.  Will repeat her  liver function tests in two weeks and see where we stand.  Further  recommendations to follow.       RMR/MEDQ  D:  04/25/2005  T:  04/25/2005  Job:  [id number]   cc:   Magnus Sinning. Dimple Casey, M.D.  [address] [place]  [place] [address]  Fax: [phone]

## 2011-03-25 NOTE — Discharge Summary (Signed)
[name], [name]                            ACCOUNT NO.:  [id number]   MEDICAL RECORD NO.:  [id number]                   PATIENT TYPE:  INP   LOCATION:  5715                                 FACILITY:  [facility]   PHYSICIAN:  Leighton Roach McDiarmid, M.D.             DATE OF BIRTH:  05/12/1961   DATE OF ADMISSION:  07/07/2003  DATE OF DISCHARGE:  07/12/2003                                 DISCHARGE SUMMARY   DISCHARGE DIAGNOSES:  1. Biliary pancreatitis.  2. Cholelithiasis.  3. History of nephrolithiasis.  4. Ovarian cyst with amenorrhea.   DISCHARGE INSTRUCTIONS:   MEDICATIONS:  The medications are same as at home prior to admission:  1. Plaquenil 200 mg p.o. daily.  2. Aviane oral contraceptive.  3. Paxil 40 mg p.o. daily.  4. Uniretic 7.5/12.5 mg one tab p.o. daily.  5. Ambien 10 mg p.o. nightly p.r.n. for insomnia.  6. Naproxen.   FOLLOWUP INSTRUCTIONS:  Follow up with Dr. Sharlet Salina T. Hoxworth in two weeks  and the patient was to call [phone]; he is the surgeon who performed her  cholecystectomy.   BRIEF HISTORY:  [name] is a 50 year old white female who presented with  several days of abdominal pain radiating to her back, constant and worse  with coughing and movement, and denied any diarrhea or bowel movement, did  have fever, chills and sweats with the pain that was located in her  epigastrium.  She has a significant history for lupus and a history of  kidney stones.  She was admitted for pancreatitis and followed by the [facility] Service.   HOSPITAL COURSE:  PROBLEM #1 - PANCREATITIS:  The patient was admitted and  kept n.p.o.  She had a right upper quadrant ultrasound that showed  gallstones and surgical consult by Dr. Velora Heckler assessed her as having  biliary pancreatitis with increased LFTs, lipase of 590 and amylase of 759.  Total bilirubin was 1.1.  On hospital day #2, [name] was tolerating ice  chips without problem and pain was  resolved.  Amylase had decreased to 186  and lipase to 69.  It was felt that this pancreatitis was secondary to  gallstones and therefore the treatment would be cholecystectomy.  Surgery  was electively performed on July 09, 2003.  Pancreatitis continued to  resolve and she was eating normally and without pain postop, home in stable  condition.   PROBLEM #2 - CHOLELITHIASIS:  This was thought to be the etiology of problem  #1.  On hospital day #5, the patient underwent a laparoscopic  cholecystectomy with IOC.  Postop diet was advanced and pain was controlled  and she was discharged on postop day #2.   PROBLEM #3 - HISTORY OF RENAL STONES:  Stable during hospitalization and no  change in treatment.   PROBLEM #4 - RIGHT OVARIAN CYST AND AMENORRHEA:  I would suggest followup  for these problems as an outpatient and continue oral contraceptive pills  and possibly will need an endometrial biopsy as an outpatient, consider  menopause and possibly could check FSH and LH or consider polycystic ovarian  syndrome.   PROBLEM #5 - VAGINAL CANDIDIASIS:  The patient was treated with Diflucan.   CONSULTATIONS DURING THIS HOSPITALIZATION:  General surgery, Dr. Gerrit Friends.   PROCEDURES DURING HOSPITALIZATION:  1. Cholecystectomy, laparoscopic.  2. CT of pelvis and abdomen without contrast that showed no evidence of     urinary obstruction or urinary calculus, but abnormal appearance of the     gallbladder, possible sludge.  Recommended ultrasound.  3. Ultrasound of the abdomen that showed cholelithiasis without definite     evidence of cholecystitis, to be correlated with clinical picture.      Ace Gins, MD                        Etta Grandchild, M.D.    JS/MEDQ  D:  09/04/2003  T:  09/05/2003  Job:  [id number]

## 2011-03-25 NOTE — Op Note (Signed)
[name], [name]              ACCOUNT NO.:  [id number]   MEDICAL RECORD NO.:  [id number]          PATIENT TYPE:  AMB   LOCATION:  DAY                           FACILITY:  [facility]   PHYSICIAN:  R. Roetta Sessions, M.D. DATE OF BIRTH:  12-21-1960   DATE OF PROCEDURE:  04/28/2005  DATE OF DISCHARGE:                                 OPERATIVE REPORT   PROCEDURE:  Diagnostic colonoscopy.   INDICATIONS FOR PROCEDURE:  The patient is a 50 year old lady with  intermittent hematochezia. Colonoscopy is now being done. Potential risks,  benefits, and alternatives have been reviewed and questions answered. She is  agreeable. Please see documentation in the medical record.   PROCEDURE NOTE:  O2 saturation, blood pressure, pulse, and respirations were  monitored throughout the entire procedure.  Conscious sedation with Versed 4  mg IV and Demerol 100 mg IV in divided doses.   INSTRUMENT:  Olympus video chip system.   FINDINGS:  Digital rectal exam revealed a couple of external hemorrhoid  tags.   ENDOSCOPIC FINDINGS:  Prep was good.   Rectum:  Examination of the rectal mucosa including retroflexed view of the  anal verge revealed granularity of the distal 3 to 4 cm of the rectal mucosa  circumferentially. Some internal hemorrhoids. Otherwise rectal mucosa  appeared unremarkable.   Colon:  Colonic mucosa was surveyed from the rectosigmoid junction through  the left, transverse, and right colon to the area of the appendiceal  orifice, ileocecal valve, and cecum. These structures were well seen and  photographed for the record. From this level, the scope was slowly  withdrawn, and all previously mentioned mucosal surfaces were again seen.  Colonic mucosa appeared normal. The distal rectum was biopsied for  histologic study. The patient tolerated the procedure well and was reactive  to endoscopy.   IMPRESSION:  1.  Internal and external hemorrhoids. Some mild granularity of the distal  rectum circumferential of uncertain significance, biopsied. Otherwise      normal rectum.  2.  Normal colon.   RECOMMENDATIONS:  1.  Follow up on biopsies.  2.  Hemorrhoid literature provided to the patient.  3.  Canasa 1 g (mesalamine) suppository 1 per rectum at bedtime for 2 weeks.  4.  Follow up on pathology.  5.  Further recommendations to follow.       RMR/MEDQ  D:  04/28/2005  T:  04/28/2005  Job:  [id number]

## 2011-03-25 NOTE — Op Note (Signed)
NAMEDEJANA, SEAS                            ACCOUNT NO.:  [id number]   MEDICAL RECORD NO.:  [id number]                   PATIENT TYPE:  INP   LOCATION:  5715                                 FACILITY:  [facility]   PHYSICIAN:  Sharlet Salina T. Hoxworth, M.D.          DATE OF BIRTH:  01/31/1961   DATE OF PROCEDURE:  07/11/2003  DATE OF DISCHARGE:                                 OPERATIVE REPORT   PREOPERATIVE DIAGNOSIS:  Gallstone pancreatitis.   POSTOPERATIVE DIAGNOSIS:  Gallstone pancreatitis.   SURGICAL PROCEDURE:  Laparoscopic cholecystectomy with intraoperative  cholangiogram.   SURGEON:  Sharlet Salina T. Hoxworth, M.D.   ASSISTANT:  Gabrielle Dare. Janee Morn, M.D.   ANESTHESIA:  General.   BRIEF HISTORY:  [name] is a 50 year old white female who presented  with acute abdominal pain, was found to have typical evidence of acute  pancreatitis.  Gallbladder ultrasound has shown multiple small stones.  Her  pancreatitis has clinically resolved, and laparoscopic cholecystectomy with  cholangiogram has been recommended and accepted.  The nature of the  procedure, its indications and risks of bleeding, infection, bowel and  pelvic injury, and possible need for open procedure were discussed and  understood.  She is now brought to the operating room for this procedure.   DESCRIPTION OF OPERATION:  The patient was brought to the operating room and  placed in the supine position on the operating table and general  endotracheal anesthesia was induced.  She received preoperative antibiotics.  The abdomen was sterilely prepped and draped.  Local anesthesia was used to  infiltrate the trocar sites prior to the incisions.  A 1-cm incision was  made at the umbilicus and dissection carried down to the midline fascia  which was sharply incised for 1 cm and the peritoneum entered under direct  vision.  Through a mattress suture of 0 Vicryl, the Hasson trocar was placed  and pneumoperitoneum established.   Under direct vision, a 10-mm trocar was  placed in the subxiphoid area and two 5-mm trocars along the right subcostal  margin.  The gallbladder was exposed and was not acutely inflamed.  The  fundus was grasped and elevated up over the liver and the infundibulum  retracted inferolaterally.  A few adhesions toward the duodenum were  carefully taken down.  Fibrofatty tissue was then stripped off the neck of  the gallbladder toward the porta hepatis.  The distal gallbladder was  thoroughly dissected.  The cystic duct was identified and dissected free,  and the cystic duct/gallbladder junction identified.  When the anatomy was  clear, the cystic duct was clipped at the gallbladder junction, and  operative cholangiogram was obtained through the cystic duct.  This showed  good filling of a normal common bile duct and intrahepatic ducts with free  flow into the duodenum and no filling defects.  Following this, the cystic  duct was doubly clipped proximally and divided.  Anterior and  posterior  branches of the cystic artery were identified and individually divided  between clips.  The gallbladder was then dissected free from its bed using  hook cautery and removed through the umbilicus.  The operative site was  irrigated and inspected for hemostasis.  Trocars were removed under direct  vision.  The mattress suture was secured at the umbilicus.  Skin incisions  were closed with interrupted subcuticular 4-0 Monocryl and Steri-Strips.  Sponge, needle, and instrument counts were correct.  Dry sterile dressings  were applied and the patient taken to the recovery room in good condition.                                               Lorne Skeens. Hoxworth, M.D.    Tory Emerald  D:  07/11/2003  T:  07/12/2003  Job:  [id number]

## 2011-03-25 NOTE — Discharge Summary (Signed)
[name], [name]                        ACCOUNT NO.:  [id number]   MEDICAL RECORD NO.:  [id number]                   PATIENT TYPE:  INP   LOCATION:  A417                                 FACILITY:  [facility]   PHYSICIAN:  Lazaro Arms, M.D.                DATE OF BIRTH:  13-Oct-1961   DATE OF ADMISSION:  06/22/2004  DATE OF DISCHARGE:  06/25/2004                                 DISCHARGE SUMMARY   DISCHARGE DIAGNOSES:  1. Status post total abdominal hysterectomy/bilateral salpingo-oophorectomy.  2. Systemic lupus erythematosus, quiescent.   Please refer to the transcribed history and physical for details of  admission.   HOSPITAL COURSE:  The patient was admitted after surgery. She had  unremarkable intraoperative course and postoperative course was the same.  She tolerated clear liquids and a regular diet, voided without symptoms. She  was extensively ambulatory, tolerated transition to oral pain medicine. Her  hemoglobin postoperative day #1 was excellent, and her hemoglobin  postoperative day #3 was stable. As a result, she was discharged to home on  the morning of postoperative day #3 in good and stable condition to follow  up in the office next to have staples removed. She was given pain medication  and Levaquin. She was given instructions and precautions for return to the  office prior to that time.     ___________________________________________                                         Lazaro Arms, M.D.   [name]  D:  07/01/2004  T:  07/01/2004  Job:  [id number]

## 2011-03-25 NOTE — H&P (Signed)
NAME:  [name], [name]                        ACCOUNT NO.:  [id number]   MEDICAL RECORD NO.:  [id number]                   PATIENT TYPE:  AMB   LOCATION:  DAY                                  FACILITY:  [facility]   PHYSICIAN:  Lazaro Arms, M.D.                DATE OF BIRTH:  [birth year]   DATE OF ADMISSION:  06/22/2004  DATE OF DISCHARGE:                                HISTORY & PHYSICAL   HISTORY OF PRESENT ILLNESS:  [name] is a 50 year old white female gravida 3,  para 2, AB 1, status post two cesarean sections and tubal ligation with her  last cesarean section, who is admitted for a TAH/BSO due to chronic pelvic  pain, long painful menstrual periods, dysmenorrhea, and dyspaurnia.  The  patient moved here not too long ago from [place].  At the time of her  moving, she was strongly considering a hysterectomy at that time.  The  patient has a long history of very painful menses, unresponsive to oral  contraceptives and/or nonsteroidals.  Of course she does not take oral  contraceptives now.  She does suffer with lupus, and has had a tubal.   PHYSICAL EXAMINATION:  GENERAL:  She has a very-tender uterus, normal size,  and adnexa is tender.  My suspicion is that she has adenomyosis.  [name] and  I talked over the options including a hysteroscopy D&C, and endometrial  ablation versus definite therapy with hysterectomy.  She has decided after  thinking it over for a couple of weeks, she wanted to have a hysterectomy  because of her adnexal pain and her dyspaurnia.  As a result, she is  admitted for TAH/BSO.   PAST MEDICAL HISTORY:  1. Systemic lupus erythematosus.  2. Hypertension.   PAST SURGICAL HISTORY:  1. She has had two cesarean section in 1997 and 1998.  2. Gallbladder in September of 2004.   MEDICATIONS:  1. Naproxen sodium 500 mg a day.  2. Ambien 10 mg a day.  3. Aciphex 20 mg a day.  4. Uniretic 7.5/ 12.5 mg a day.  5. Plaquenil 200 mg a day.  6. Paxil 40 mg a  day.  7. AVN 28 mg a day.   ALLERGIES:  None.   REVIEW OF SYSTEMS:  Otherwise negative.   PHYSICAL EXAMINATION:  HEENT:  Unremarkable.  Thyroid is normal.  LUNGS:  Clear.  HEART:  Regular rate and rhythm without murmur or gallop.  BREASTS:  Without masses, discharge or skin changes.  ABDOMEN:  Benign, no hepatosplenomegaly or masses.  GU:  She has normal external genitalia.  Vagina is pink and moist, no  discharge.  Cervix nulliparous, without lesions.  Uterus is normal size,  shape and contour but tenderness to palpation.  Adnexa is also tender but  normal size.  EXTREMITIES:  Warm with no edema.   IMPRESSION:  1. Menometrorrhagia.  2. Dysmenorrhea.  3. Dyspaurnia.  4.  Probable adenomyosis.   PLAN:  The patient is admitted for TAH/BSO.  She understands the risks,  benefits, indications and alternatives, and will proceed.     ___________________________________________                                         Lazaro Arms, M.D.   Loraine Maple  D:  06/21/2004  T:  06/21/2004  Job:  [id number]

## 2011-08-22 LAB — COMPREHENSIVE METABOLIC PANEL
ALT: 34
AST: 27
Alkaline Phosphatase: 84
CO2: 30
Calcium: 9.1
Chloride: 104
GFR calc Af Amer: >60
GFR calc non Af Amer: >60
Glucose, Bld: 114 — ABNORMAL HIGH
Potassium: 3.7
Sodium: 140
Total Bilirubin: 0.5

## 2011-08-22 LAB — URINALYSIS, ROUTINE W REFLEX MICROSCOPIC
Bilirubin Urine: NEGATIVE
Hgb urine dipstick: NEGATIVE
Ketones, ur: NEGATIVE
Nitrite: NEGATIVE
pH: 6

## 2011-08-22 LAB — DIFFERENTIAL
Basophils Absolute: 0
Basophils Relative: 0
Eosinophils Absolute: 0.2
Eosinophils Relative: 2
Neutrophils Relative %: 53

## 2011-08-22 LAB — CBC
Hemoglobin: 13.8
MCHC: 33.8
RBC: 4.76
WBC: 6.9

## 2011-08-22 LAB — LIPASE, BLOOD: Lipase: 24

## 2011-08-22 LAB — URINE MICROSCOPIC-ADD ON

## 2013-01-23 ENCOUNTER — Other Ambulatory Visit: Payer: Self-pay | Admitting: Physician Assistant

## 2013-01-23 MED ORDER — METFORMIN HCL 500 MG PO TABS: 500.0000 mg | ORAL_TABLET | Freq: Every day | ORAL | Status: DC

## 2013-01-28 ENCOUNTER — Other Ambulatory Visit: Payer: Self-pay

## 2013-02-01 ENCOUNTER — Emergency Department (HOSPITAL_COMMUNITY)
Admission: EM | Admit: 2013-02-01 | Discharge: 2013-02-01 | Disposition: A | Discharge status: Home / Self Care | Payer: Medicare Other | Attending: Emergency Medicine | Admitting: Emergency Medicine

## 2013-02-01 ENCOUNTER — Telehealth: Payer: Self-pay

## 2013-02-01 ENCOUNTER — Encounter (HOSPITAL_COMMUNITY): Payer: Self-pay | Admitting: Emergency Medicine

## 2013-02-01 DIAGNOSIS — Z79899 Other long term (current) drug therapy: Secondary | ICD-10-CM | POA: Insufficient documentation

## 2013-02-01 DIAGNOSIS — K219 Gastro-esophageal reflux disease without esophagitis: Secondary | ICD-10-CM | POA: Insufficient documentation

## 2013-02-01 DIAGNOSIS — F329 Major depressive disorder, single episode, unspecified: Secondary | ICD-10-CM | POA: Insufficient documentation

## 2013-02-01 DIAGNOSIS — R109 Unspecified abdominal pain: Secondary | ICD-10-CM

## 2013-02-01 DIAGNOSIS — R197 Diarrhea, unspecified: Secondary | ICD-10-CM | POA: Insufficient documentation

## 2013-02-01 DIAGNOSIS — F3289 Other specified depressive episodes: Secondary | ICD-10-CM | POA: Insufficient documentation

## 2013-02-01 DIAGNOSIS — E119 Type 2 diabetes mellitus without complications: Secondary | ICD-10-CM | POA: Insufficient documentation

## 2013-02-01 DIAGNOSIS — Z8739 Personal history of other diseases of the musculoskeletal system and connective tissue: Secondary | ICD-10-CM | POA: Insufficient documentation

## 2013-02-01 DIAGNOSIS — G2581 Restless legs syndrome: Secondary | ICD-10-CM | POA: Insufficient documentation

## 2013-02-01 DIAGNOSIS — Z8719 Personal history of other diseases of the digestive system: Secondary | ICD-10-CM | POA: Insufficient documentation

## 2013-02-01 DIAGNOSIS — IMO0002 Reserved for concepts with insufficient information to code with codable children: Secondary | ICD-10-CM | POA: Insufficient documentation

## 2013-02-01 DIAGNOSIS — R079 Chest pain, unspecified: Secondary | ICD-10-CM | POA: Insufficient documentation

## 2013-02-01 DIAGNOSIS — K921 Melena: Secondary | ICD-10-CM | POA: Insufficient documentation

## 2013-02-01 DIAGNOSIS — Z87442 Personal history of urinary calculi: Secondary | ICD-10-CM | POA: Insufficient documentation

## 2013-02-01 DIAGNOSIS — I10 Essential (primary) hypertension: Secondary | ICD-10-CM | POA: Insufficient documentation

## 2013-02-01 DIAGNOSIS — R110 Nausea: Secondary | ICD-10-CM | POA: Insufficient documentation

## 2013-02-01 HISTORY — DX: Calculus of kidney: N20.0

## 2013-02-01 HISTORY — DX: Essential (primary) hypertension: I10

## 2013-02-01 HISTORY — DX: Gastro-esophageal reflux disease without esophagitis: K21.9

## 2013-02-01 HISTORY — DX: Depression, unspecified: F32.A

## 2013-02-01 HISTORY — DX: Reserved for concepts with insufficient information to code with codable children: IMO0002

## 2013-02-01 HISTORY — DX: Restless legs syndrome: G25.81

## 2013-02-01 HISTORY — DX: Systemic lupus erythematosus, unspecified: M32.9

## 2013-02-01 HISTORY — DX: Major depressive disorder, single episode, unspecified: F32.9

## 2013-02-01 HISTORY — DX: Type 2 diabetes mellitus without complications: E11.9

## 2013-02-01 LAB — URINE MICROSCOPIC-ADD ON

## 2013-02-01 LAB — COMPREHENSIVE METABOLIC PANEL
Alkaline Phosphatase: 75 U/L (ref 39–117)
BUN: 15 mg/dL (ref 6–23)
CO2: 28 mEq/L (ref 19–32)
Chloride: 105 mEq/L (ref 96–112)
Creatinine, Ser: 0.68 mg/dL (ref 0.50–1.10)
GFR calc non Af Amer: >90 mL/min (ref >90)
Glucose, Bld: 131 mg/dL — ABNORMAL HIGH (ref 70–99)
Potassium: 4 mEq/L (ref 3.5–5.1)
Total Bilirubin: 0.3 mg/dL (ref 0.3–1.2)

## 2013-02-01 LAB — CBC WITH DIFFERENTIAL/PLATELET
Eosinophils Absolute: 0.1 10*3/uL (ref 0.0–0.7)
Eosinophils Relative: 2 % (ref 0–5)
HCT: 38.6 % (ref 36.0–46.0)
Lymphs Abs: 2.4 10*3/uL (ref 0.7–4.0)
MCH: 29 pg (ref 26.0–34.0)
MCV: 84.1 fL (ref 78.0–100.0)
Monocytes Absolute: 0.5 10*3/uL (ref 0.1–1.0)
Monocytes Relative: 8 % (ref 3–12)
Platelets: 194 10*3/uL (ref 150–400)
RBC: 4.59 MIL/uL (ref 3.87–5.11)

## 2013-02-01 LAB — URINALYSIS, ROUTINE W REFLEX MICROSCOPIC
Bilirubin Urine: NEGATIVE
Glucose, UA: NEGATIVE mg/dL
Hgb urine dipstick: NEGATIVE
Ketones, ur: 15 mg/dL — AB
Protein, ur: NEGATIVE mg/dL
pH: 5.5 (ref 5.0–8.0)

## 2013-02-01 LAB — GLUCOSE, CAPILLARY: Glucose-Capillary: 137 mg/dL — ABNORMAL HIGH (ref 70–99)

## 2013-02-01 MED ORDER — CHOLESTYRAMINE 4 G PO PACK: 1.0000 | PACK | Freq: Three times a day (TID) | ORAL | Status: DC

## 2013-02-01 MED ORDER — ONDANSETRON 4 MG PO TBDP
4.0000 mg | ORAL_TABLET | Freq: Once | ORAL | Status: AC
  Administered 2013-02-01: 4 mg via ORAL
  Filled 2013-02-01: qty 1

## 2013-02-01 MED ORDER — DICYCLOMINE HCL 20 MG PO TABS: 20.0000 mg | ORAL_TABLET | Freq: Two times a day (BID) | ORAL | Status: DC

## 2013-02-01 MED ORDER — HYDROCODONE-ACETAMINOPHEN 5-325 MG PO TABS
2.0000 | ORAL_TABLET | Freq: Once | ORAL | Status: AC
  Administered 2013-02-01: 2 via ORAL
  Filled 2013-02-01: qty 2

## 2013-02-01 MED ORDER — HYDROCODONE-ACETAMINOPHEN 5-325 MG PO TABS: 1.0000 | ORAL_TABLET | Freq: Four times a day (QID) | ORAL | Status: DC | PRN

## 2013-02-01 NOTE — ED Notes (Signed)
Pt reports 3 episodes of bloody diarrhea in the last week. Pt c/o mild abdominal discomfort, cramping. Pt c/o nausea but no vomiting, denies fever.

## 2013-02-01 NOTE — ED Notes (Signed)
Pt. Is aware of needing a stool specimen.  Unable to give [place] one at this time.

## 2013-02-01 NOTE — Telephone Encounter (Signed)
Pt called this morning because she was having BRBPR when she went to the bathroom last night and this morning. She is having some abd pain with it. We have not seen her since 2010 so I advised her to go to her PCP or the ER.

## 2013-02-01 NOTE — ED Notes (Signed)
Unable to obtain stool specimen.

## 2013-02-01 NOTE — Telephone Encounter (Signed)
If patient did not go to the emergency department, she needs an urgent appointment here the first of the week

## 2013-02-01 NOTE — ED Provider Notes (Signed)
History     CSN: [id number]  Arrival date & time 02/01/13  1014   First MD Initiated Contact with Patient 02/01/13 1040      Chief Complaint  Patient presents with  . Melena    (Consider location/radiation/quality/duration/timing/severity/associated sxs/prior treatment) HPI Comments: 52 year old female with a past medical history of lupus, morbid obesity, new onset diabetes mellitus, hypertension and GERD presents in emergency chief complaint of hematochezia and diarrhea.  States has been going on for the past week.  3-4 watery and bloody stools daily.  She has mild bilateral lower car crampy abdominal pain.  She's had subjective fevers with out chills or diaphoresis. She denies contacts with similar sxs, ingestion of suspect foods or water, history of similar sxs, recent foreign travel . The patient has a history of lupus and is on Plaquenil with good control of her symptoms.  She has not been taking any steroids.  Patient has a history of recent sinus infection with use of Z-Pak.  2 history of UTIs without complaint of urinary symptoms today.  Patient is a 52 y.o. female presenting with diarrhea and abdominal pain. The history is provided by the patient, medical records and a friend. No language interpreter was used.  Diarrhea Associated symptoms: abdominal pain   Associated symptoms: no chills, no fever and no vomiting   Abdominal Pain Pain location:  Periumbilical, LLQ and RLQ Pain quality: bloating, cramping and fullness   Pain radiates to:  Does not radiate Pain severity:  Mild Onset quality:  Gradual Duration:  1 week Timing:  Intermittent Progression:  Unchanged Chronicity:  New Context: previous surgery and recent illness (Sinusitits with receent zpack use)   Context: not alcohol use, not awakening from sleep, not diet changes, not eating, not laxative use, not medication withdrawal, not recent sexual activity, not recent travel, not retching, not sick contacts, not  suspicious food intake and not trauma   Worsened by:  Nothing tried Ineffective treatments:  None tried Associated symptoms: chest pain (daily, fleeting, intermittent), diarrhea, hematochezia and nausea (mild, intermittent)   Associated symptoms: no anorexia, no belching, no chills, no constipation, no cough, no dysuria, no fatigue, no fever, no flatus, no hematemesis, no melena, no shortness of breath, no sore throat, no vaginal bleeding, no vaginal discharge and no vomiting   Diarrhea:    Quality:  Bloody, watery and semi-solid   Number of occurrences:  3-4 times a day for the past week   Severity:  Moderate   Timing:  Sporadic   Progression:  Unchanged Risk factors: multiple surgeries and obesity   Risk factors: no alcohol abuse, no aspirin use, not elderly, no NSAID use, not pregnant and no recent hospitalization     Past Medical History  Diagnosis Date  . Lupus   . Diabetes mellitus without complication   . Hypertension   . Kidney stone   . Depression   . GERD (gastroesophageal reflux disease)   . Restless leg     Past Surgical History  Procedure Laterality Date  . Cholecystectomy    . Abdominal hysterectomy      No family history on file.  History  Substance Use Topics  . Smoking status: Not on file  . Smokeless tobacco: Not on file  . Alcohol Use: Not on file    OB History   Grav Para Term Preterm Abortions TAB SAB Ect Mult Living                  Review of  Systems  Constitutional: Negative for fever, chills and fatigue.  HENT: Negative for sore throat.   Respiratory: Negative for cough and shortness of breath.   Cardiovascular: Positive for chest pain (daily, fleeting, intermittent).  Gastrointestinal: Positive for nausea (mild, intermittent), abdominal pain, diarrhea and hematochezia. Negative for vomiting, constipation, melena, anorexia, flatus and hematemesis.  Genitourinary: Negative for dysuria, vaginal bleeding and vaginal discharge.    Allergies   Review of patient's allergies indicates no known allergies.  Home Medications   Current Outpatient Rx  Name  Route  Sig  Dispense  Refill  . amLODipine (NORVASC) 5 MG tablet   Oral   Take 5 mg by mouth daily.         Marland Kitchen atorvastatin (LIPITOR) 40 MG tablet   Oral   Take 40 mg by mouth daily.         Marland Kitchen dexlansoprazole (DEXILANT) 60 MG capsule   Oral   Take 60 mg by mouth daily. Patient took two tablets (120mg ) on Thursday because she missed a dose on Wednesday.         . DULoxetine (CYMBALTA) 60 MG capsule   Oral   Take 60 mg by mouth at bedtime.         . fluticasone (FLONASE) 50 MCG/ACT nasal spray   Nasal   Place 2 sprays into the nose as needed for rhinitis or allergies.         . hydrochlorothiazide (HYDRODIURIL) 25 MG tablet   Oral   Take 25 mg by mouth daily.         . hydroxychloroquine (PLAQUENIL) 200 MG tablet   Oral   Take 200 mg by mouth daily.         Marland Kitchen levothyroxine (SYNTHROID, LEVOTHROID) 200 MCG tablet   Oral   Take 200 mcg by mouth daily.         Marland Kitchen lisinopril (PRINIVIL,ZESTRIL) 40 MG tablet   Oral   Take 40 mg by mouth daily.         . metFORMIN (GLUCOPHAGE) 500 MG tablet   Oral   Take 1 tablet (500 mg total) by mouth daily with breakfast.   30 tablet   3   . OVER THE COUNTER MEDICATION      Patient takes cetirizine daily over the counter for allergies. Not sure the strength .         . pramipexole (MIRAPEX) 0.125 MG tablet   Oral   Take 0.125 mg by mouth daily.           BP 164/81  Pulse 83  Temp(Src) 98.6 F (37 C) (Oral)  Resp 18  Ht 5\' 6"  (1.676 m)  Wt 329 lb (149.233 kg)  BMI 53.13 kg/m2  SpO2 98%  Physical Exam  Nursing note and vitals reviewed. Constitutional: She is oriented to person, place, and time. She appears well-developed and well-nourished. No distress.  HENT:  Head: Normocephalic and atraumatic.  Eyes: Conjunctivae are normal. No scleral icterus.  Neck: Normal range of motion.   Cardiovascular: Normal rate, regular rhythm and normal heart sounds.  Exam reveals no gallop and no friction rub.   No murmur heard. Pulmonary/Chest: Effort normal and breath sounds normal. No respiratory distress.  Abdominal: Soft. Bowel sounds are normal. She exhibits no distension and no mass. There is no tenderness. There is no guarding.  Genitourinary:  Digital Rectal Exam reveals sphincter with good tone.  Nonthrombosed external hemorrhoids. No masses or fissures. Stool color is brown with no overt  blood.  No pain on exam.   Neurological: She is alert and oriented to person, place, and time.  Skin: Skin is warm and dry. She is not diaphoretic.    ED Course  Procedures (including critical care time)  Labs Reviewed  OCCULT BLOOD, POC DEVICE - Abnormal; Notable for the following:    Fecal Occult Bld POSITIVE (*)    All other components within normal limits  STOOL CULTURE  CBC WITH DIFFERENTIAL  BASIC METABOLIC PANEL  URINALYSIS, ROUTINE W REFLEX MICROSCOPIC   No results found.   No diagnosis found.    MDM  12:27 PM BP 164/81  Pulse 83  Temp(Src) 98.6 F (37 C) (Oral)  Resp 18  Ht 5\' 6"  (1.676 m)  Wt 329 lb (149.233 kg)  BMI 53.13 kg/m2  SpO2 98% Patient with complaint of bloody diarrhea for the past week.  She states that the amount of blood has filled the bowl.  She denies any symptoms such as racing heart, weakness, dizziness.  her abdominal exam is unremarkable.  There is no focal tenderness.  Patient has mild discomfort with deep palpation of the abdomen.  Currently a painting labs.  The patient does have a positive fecal occult blood test.  I do not expect to inflammatory bowel diseases patient is currently on a DMARD.  She is unsure of her past family history as patient is adopted.  This is not present with a surgical abdomen.  Asked her for a stool sample.   Filed Vitals:   02/01/13 1024  BP: 164/81  Pulse: 83  Temp: 98.6 F (37 C)  Resp: 18  patient  unable to provide a a stool sample.  UA abnormalities likely a result of vaginal contammination and some dehydration.  Patient has pos. FOBT, but asymptomatic without anemia.  She has an established relationship with GI.  Patient will be discharged to follow up.   The patient appears reasonably screened and/or stabilized for discharge and I doubt any other medical condition or other [facility] requiring further screening, evaluation, or treatment in the ED at this time prior to discharge.     Arthor Captain, PA-C 02/04/13 2049

## 2013-02-04 ENCOUNTER — Encounter (HOSPITAL_COMMUNITY): Payer: Self-pay | Admitting: Pharmacy Technician

## 2013-02-04 ENCOUNTER — Encounter: Payer: Self-pay | Admitting: Gastroenterology

## 2013-02-04 ENCOUNTER — Ambulatory Visit (INDEPENDENT_AMBULATORY_CARE_PROVIDER_SITE_OTHER): Payer: Medicare Other | Admitting: Gastroenterology

## 2013-02-04 VITALS — BP 141/82 | HR 84 | Temp 97.2°F | Ht 66.0 in | Wt 331.0 lb

## 2013-02-04 DIAGNOSIS — K7689 Other specified diseases of liver: Secondary | ICD-10-CM

## 2013-02-04 DIAGNOSIS — K625 Hemorrhage of anus and rectum: Secondary | ICD-10-CM

## 2013-02-04 NOTE — Progress Notes (Signed)
Primary Care Physician:  Rudi Heap, MD Primary Gastroenterologist:  Dr. Jena Gauss   Chief Complaint  Patient presents with  . Rectal Bleeding    HPI:   [name] is a 52 year old pleasant female presenting today at the request of the ED secondary to rectal bleeding. She has a history of GERD, fatty liver disease, proctitis. Last seen by our office in Feb 2010 by Dr. Jena Gauss. Last colonoscopy June 2006 by Dr. Jena Gauss with internal/external hemorrhoids, mild granularity of distal rectum, otherwise normal rectum and colon. Provided Canasa suppositories at that time.   3 days last week of rectal bleeding. Brought pictures on her phone and showed me. Obvious bright red blood in toilet, mixed with stool. States stools are chronically semi-loose. Usually has BM daily to every other day, noted Bristol scale 5-6. Has pressure in abdomen, lower back. No BM since Fri am. No sick contacts. Was on a Z-pack a few weeks ago for sinus issues. +chills/sweats, no fever. Has fan on her at night. Could care less if she ate half the time. Small amount of nausea with the pain. Prior to this would have only low-volume hematochezia. No rectal discomfort, pain. Ibuprofen prn but not routinely.  Past Medical History  Diagnosis Date  . Lupus   . Diabetes mellitus without complication   . Hypertension   . Kidney stone   . Depression   . GERD (gastroesophageal reflux disease)   . Restless leg   . IBS (irritable bowel syndrome)   . Graves disease     s/p radioactive therapy    Past Surgical History  Procedure Laterality Date  . Cholecystectomy    . Abdominal hysterectomy    . Colonoscopy  2006    RMR: internal/external hemorrhoids, proctitis    Current Outpatient Prescriptions  Medication Sig Dispense Refill  . amLODipine (NORVASC) 5 MG tablet Take 5 mg by mouth daily.      Marland Kitchen atorvastatin (LIPITOR) 40 MG tablet Take 40 mg by mouth daily.      Marland Kitchen dexlansoprazole (DEXILANT) 60 MG capsule Take 60 mg by mouth  daily.       Marland Kitchen dicyclomine (BENTYL) 20 MG tablet Take 1 tablet (20 mg total) by mouth 2 (two) times daily.  20 tablet  0  . DULoxetine (CYMBALTA) 60 MG capsule Take 60 mg by mouth at bedtime.      . fluticasone (FLONASE) 50 MCG/ACT nasal spray Place 2 sprays into the nose as needed for rhinitis or allergies.      . hydrochlorothiazide (HYDRODIURIL) 25 MG tablet Take 25 mg by mouth daily.      . hydroxychloroquine (PLAQUENIL) 200 MG tablet Take 200 mg by mouth daily.      Marland Kitchen levothyroxine (SYNTHROID, LEVOTHROID) 200 MCG tablet Take 200 mcg by mouth daily.      Marland Kitchen lisinopril (PRINIVIL,ZESTRIL) 40 MG tablet Take 40 mg by mouth daily.      . metFORMIN (GLUCOPHAGE) 500 MG tablet Take 1 tablet (500 mg total) by mouth daily with breakfast.  30 tablet  3  . HYDROcodone-acetaminophen (NORCO/VICODIN) 5-325 MG per tablet Take 1-2 tablets by mouth every 6 (six) hours as needed for pain.  20 tablet  0   No current facility-administered medications for this visit.    Allergies as of 02/04/2013  . (No Known Allergies)    Family History  Problem Relation Age of Onset  . Colon cancer Father     patient was adopted, but she knows birth father. Believes he had  colon cancer    History   Social History  . Marital Status: Divorced    Spouse Name: N/A    Number of Children: N/A  . Years of Education: N/A   Occupational History  . Not on file.   Social History Main Topics  . Smoking status: Never Smoker   . Smokeless tobacco: Not on file  . Alcohol Use: No  . Drug Use: No  . Sexually Active: Not on file   Other Topics Concern  . Not on file   Social History Narrative  . No narrative on file    Review of Systems: Gen: SEE HPI CV: Denies chest pain, heart palpitations, peripheral edema, syncope.  Resp: Denies shortness of breath at rest or with exertion. Denies wheezing or cough.  GI: SEE HPI GU : Denies urinary burning, urinary hesitancy MS: +lupus  Derm: Denies rash, itching, dry  skin Psych: +depression Heme: Denies bruising, bleeding, and enlarged lymph nodes.  Physical Exam: BP 141/82  Pulse 84  Temp(Src) 97.2 F (36.2 C) (Oral)  Ht 5\' 6"  (1.676 m)  Wt 331 lb (150.141 kg)  BMI 53.45 kg/m2 General:   Alert and oriented. Pleasant and cooperative. Well-nourished and well-developed.  Head:  Normocephalic and atraumatic. Eyes:  Without icterus, sclera clear and conjunctiva pink.  Ears:  Normal auditory acuity. Nose:  No deformity, discharge,  or lesions. Mouth:  No deformity or lesions, oral mucosa pink.  Neck:  Supple, without mass or thyromegaly. Lungs:  Clear to auscultation bilaterally. No wheezes, rales, or rhonchi. No distress.  Heart:  S1, S2 present without murmurs appreciated.  Abdomen:  +BS, soft, mild TTP lower abdomen and non-distended. No HSM noted. No guarding or rebound. No masses appreciated.  Rectal:  Deferred  Msk:  Symmetrical without gross deformities. Normal posture. Extremities:  Trace lower extremity edema Neurologic:  Alert and  oriented x4;  grossly normal neurologically. Skin:  Intact without significant lesions or rashes. Cervical Nodes:  No significant cervical adenopathy. Psych:  Alert and cooperative. Normal mood and affect.

## 2013-02-04 NOTE — Assessment & Plan Note (Signed)
Recent LFTs on file, normal. Repeat in 1 year.

## 2013-02-04 NOTE — Assessment & Plan Note (Signed)
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 Last evidence of rectal bleeding several days ago. No changes in bowel habits, but she does note pressure in lower abdomen. No BM for several days. Associated nausea. She states her birth father had a history of colon cancer. Rectal bleeding may be from benign rectal source, but due to her personal and family history, an updated colonoscopy is warranted. Hgb stable, normal.  Proceed with TCS with Dr. Jena Gauss in near future: the risks, benefits, and alternatives have been discussed with the patient in detail. The patient states understanding and desires to proceed. Phenergan 12.5 mg IV on call to augment sedation. No evidence of loose stools/diarrhea. Patient to contact us if any changes in symptoms to include diarrhea, further rectal bleeding.

## 2013-02-04 NOTE — Telephone Encounter (Signed)
Pt did go to [facility] on 02/01/13

## 2013-02-04 NOTE — Patient Instructions (Addendum)
We have set you up for a colonoscopy with Dr. Jena Gauss in the near future.  Please call me if you have any further rectal bleeding or any evidence of diarrhea.   Do not take metformin the day of the procedure.

## 2013-02-04 NOTE — Telephone Encounter (Signed)
Pt is aware of OV today at 11 with AS

## 2013-02-05 NOTE — Progress Notes (Signed)
CC PCP 

## 2013-02-05 NOTE — ED Provider Notes (Signed)
Medical screening examination/treatment/procedure(s) were performed by non-physician practitioner and as supervising physician I was immediately available for consultation/collaboration.    Shelda Jakes, MD 02/05/13 [phone]

## 2013-02-06 ENCOUNTER — Ambulatory Visit (HOSPITAL_COMMUNITY)
Admission: RE | Admit: 2013-02-06 | Discharge: 2013-02-06 | Disposition: A | Discharge status: Home / Self Care | Payer: Medicare Other | Source: Ambulatory Visit | Attending: Internal Medicine | Admitting: Internal Medicine

## 2013-02-06 ENCOUNTER — Encounter (HOSPITAL_COMMUNITY): Payer: Self-pay | Admitting: *Deleted

## 2013-02-06 ENCOUNTER — Encounter (HOSPITAL_COMMUNITY)
Admission: RE | Disposition: A | Discharge status: Home / Self Care | Payer: Self-pay | Source: Ambulatory Visit | Attending: Internal Medicine

## 2013-02-06 DIAGNOSIS — K921 Melena: Secondary | ICD-10-CM

## 2013-02-06 DIAGNOSIS — I10 Essential (primary) hypertension: Secondary | ICD-10-CM | POA: Insufficient documentation

## 2013-02-06 DIAGNOSIS — K648 Other hemorrhoids: Secondary | ICD-10-CM

## 2013-02-06 DIAGNOSIS — K625 Hemorrhage of anus and rectum: Secondary | ICD-10-CM

## 2013-02-06 DIAGNOSIS — Z01812 Encounter for preprocedural laboratory examination: Secondary | ICD-10-CM | POA: Insufficient documentation

## 2013-02-06 DIAGNOSIS — E119 Type 2 diabetes mellitus without complications: Secondary | ICD-10-CM | POA: Insufficient documentation

## 2013-02-06 HISTORY — PX: COLONOSCOPY: SHX5424

## 2013-02-06 SURGERY — COLONOSCOPY
Anesthesia: Moderate Sedation

## 2013-02-06 MED ORDER — MEPERIDINE HCL 100 MG/ML IJ SOLN
INTRAMUSCULAR | Status: AC
  Filled 2013-02-06: qty 2

## 2013-02-06 MED ORDER — SODIUM CHLORIDE 0.9 % IJ SOLN
INTRAMUSCULAR | Status: AC
  Filled 2013-02-06: qty 10

## 2013-02-06 MED ORDER — MIDAZOLAM HCL 5 MG/5ML IJ SOLN
INTRAMUSCULAR | Status: DC | PRN
  Administered 2013-02-06 (×2): 2 mg via INTRAVENOUS

## 2013-02-06 MED ORDER — ONDANSETRON HCL 4 MG/2ML IJ SOLN
INTRAMUSCULAR | Status: DC | PRN
  Administered 2013-02-06: 4 mg via INTRAVENOUS

## 2013-02-06 MED ORDER — ONDANSETRON HCL 4 MG/2ML IJ SOLN
INTRAMUSCULAR | Status: AC
  Filled 2013-02-06: qty 2

## 2013-02-06 MED ORDER — SODIUM CHLORIDE 0.9 % IV SOLN
INTRAVENOUS | Status: DC
  Administered 2013-02-06: 13:00:00 via INTRAVENOUS

## 2013-02-06 MED ORDER — PROMETHAZINE HCL 25 MG/ML IJ SOLN
12.5000 mg | Freq: Once | INTRAMUSCULAR | Status: AC
  Administered 2013-02-06: 12.5 mg via INTRAVENOUS

## 2013-02-06 MED ORDER — PROMETHAZINE HCL 25 MG/ML IJ SOLN
INTRAMUSCULAR | Status: AC
  Filled 2013-02-06: qty 1

## 2013-02-06 MED ORDER — STERILE WATER FOR IRRIGATION IR SOLN
Status: DC | PRN
  Administered 2013-02-06: 14:00:00

## 2013-02-06 MED ORDER — MEPERIDINE HCL 100 MG/ML IJ SOLN
INTRAMUSCULAR | Status: DC | PRN
  Administered 2013-02-06: 25 mg via INTRAVENOUS
  Administered 2013-02-06: 50 mg via INTRAVENOUS

## 2013-02-06 MED ORDER — MIDAZOLAM HCL 5 MG/5ML IJ SOLN
INTRAMUSCULAR | Status: AC
  Filled 2013-02-06: qty 10

## 2013-02-06 NOTE — Interval H&P Note (Signed)
History and Physical Interval Note:  02/06/2013 2:09 PM  [name]  has presented today for surgery, with the diagnosis of RECTAL BLEEDING  The various methods of treatment have been discussed with the patient and family. After consideration of risks, benefits and other options for treatment, the patient has consented to  Procedure(s) with comments: COLONOSCOPY (N/A) - 2:00 as a surgical intervention .  The patient's history has been reviewed, patient examined, no change in status, stable for surgery.  I have reviewed the patient's chart and labs.  Questions were answered to the patient's satisfaction.     [name] Colonoscopy today per plan. The risks, benefits, limitations, alternatives and imponderables have been reviewed with the patient. Questions have been answered. All parties are agreeable.

## 2013-02-06 NOTE — Op Note (Signed)
[facility] [address] [place], [address]   COLONOSCOPY PROCEDURE REPORT  PATIENT: [name], [name]  MR#:         [id number] BIRTHDATE: June 01, 1961 , 51  yrs. old GENDER: Female ENDOSCOPIST: R.  Roetta Sessions, MD FACP [facility] REFERRED BY:  Rudi Heap, M.D. PROCEDURE DATE:  02/06/2013 PROCEDURE:     Diagnostic colonoscopy  INDICATIONS: Hematochezia  INFORMED CONSENT:  The risks, benefits, alternatives and imponderables including but not limited to bleeding, perforation as well as the possibility of a missed lesion have been reviewed.  The potential for biopsy, lesion removal, etc. have also been discussed.  Questions have been answered.  All parties agreeable. Please see the history and physical in the medical record for more information.  MEDICATIONS: Versed 4 mg IV and Demerol 75 mg doses. Zofran 4 mg. Phenergan 12.5 mg IV  DESCRIPTION OF PROCEDURE:  After a digital rectal exam was performed, the EC-[id number] ([id number])  colonoscope was advanced from the anus through the rectum and colon to the area of the cecum, ileocecal valve and appendiceal orifice.  The cecum was deeply intubated.  These structures were well-seen and photographed for the record.  From the level of the cecum and ileocecal valve, the scope was slowly and cautiously withdrawn.  The mucosal surfaces were carefully surveyed utilizing scope tip deflection to facilitate fold flattening as needed.  The scope was pulled down into the rectum where a thorough examination including retroflexion was performed.    FINDINGS:  Inadequate preparation.     Grossly normal rectum aside, from the anal canal hemorrhoids, but poorly seen. The colonic mucosa that was seen and appeared normal, however, quite a bit of granular viscous colonic effluent precluded complete examination of all the colonic mucosal surfaces.  THERAPEUTIC / DIAGNOSTIC MANEUVERS PERFORMED:  None  COMPLICATIONS: None  CECAL  WITHDRAWAL TIME:  7 minutes  IMPRESSION:  Anal canal hemorrhoids. Inadequate preparation.  RECOMMENDATIONS: Course of Anusol suppositories. Office follow up in 6 weeks. Repeat colonoscopy within the next one year   _______________________________ eSigned:  R. Roetta Sessions, MD FACP [facility] 02/06/2013 2:44 PM   CC:    PATIENT NAME:  [name], [name] MR#: [id number]

## 2013-02-06 NOTE — H&P (View-Only) (Signed)
Primary Care Physician:  MOORE, DONALD, MD Primary Gastroenterologist:  Dr. Rourk   Chief Complaint  Patient presents with  . Rectal Bleeding    HPI:   Ms. Stucker is a 52-year-old pleasant female presenting today at the request of the ED secondary to rectal bleeding. She has a history of GERD, fatty liver disease, proctitis. Last seen by our office in Feb 2010 by Dr. Rourk. Last colonoscopy June 2006 by Dr. Rourk with internal/external hemorrhoids, mild granularity of distal rectum, otherwise normal rectum and colon. Provided Canasa suppositories at that time.   3 days last week of rectal bleeding. Brought pictures on her phone and showed me. Obvious bright red blood in toilet, mixed with stool. States stools are chronically semi-loose. Usually has BM daily to every other day, noted Bristol scale 5-6. Has pressure in abdomen, lower back. No BM since Fri am. No sick contacts. Was on a Z-pack a few weeks ago for sinus issues. +chills/sweats, no fever. Has fan on her at night. Could care less if she ate half the time. Small amount of nausea with the pain. Prior to this would have only low-volume hematochezia. No rectal discomfort, pain. Ibuprofen prn but not routinely.  Past Medical History  Diagnosis Date  . Lupus   . Diabetes mellitus without complication   . Hypertension   . Kidney stone   . Depression   . GERD (gastroesophageal reflux disease)   . Restless leg   . IBS (irritable bowel syndrome)   . Graves disease     s/p radioactive therapy    Past Surgical History  Procedure Laterality Date  . Cholecystectomy    . Abdominal hysterectomy    . Colonoscopy  2006    RMR: internal/external hemorrhoids, proctitis    Current Outpatient Prescriptions  Medication Sig Dispense Refill  . amLODipine (NORVASC) 5 MG tablet Take 5 mg by mouth daily.      . atorvastatin (LIPITOR) 40 MG tablet Take 40 mg by mouth daily.      . dexlansoprazole (DEXILANT) 60 MG capsule Take 60 mg by mouth  daily.       . dicyclomine (BENTYL) 20 MG tablet Take 1 tablet (20 mg total) by mouth 2 (two) times daily.  20 tablet  0  . DULoxetine (CYMBALTA) 60 MG capsule Take 60 mg by mouth at bedtime.      . fluticasone (FLONASE) 50 MCG/ACT nasal spray Place 2 sprays into the nose as needed for rhinitis or allergies.      . hydrochlorothiazide (HYDRODIURIL) 25 MG tablet Take 25 mg by mouth daily.      . hydroxychloroquine (PLAQUENIL) 200 MG tablet Take 200 mg by mouth daily.      . levothyroxine (SYNTHROID, LEVOTHROID) 200 MCG tablet Take 200 mcg by mouth daily.      . lisinopril (PRINIVIL,ZESTRIL) 40 MG tablet Take 40 mg by mouth daily.      . metFORMIN (GLUCOPHAGE) 500 MG tablet Take 1 tablet (500 mg total) by mouth daily with breakfast.  30 tablet  3  . HYDROcodone-acetaminophen (NORCO/VICODIN) 5-325 MG per tablet Take 1-2 tablets by mouth every 6 (six) hours as needed for pain.  20 tablet  0   No current facility-administered medications for this visit.    Allergies as of 02/04/2013  . (No Known Allergies)    Family History  Problem Relation Age of Onset  . Colon cancer Father     patient was adopted, but she knows birth father. Believes he had   colon cancer    History   Social History  . Marital Status: Divorced    Spouse Name: N/A    Number of Children: N/A  . Years of Education: N/A   Occupational History  . Not on file.   Social History Main Topics  . Smoking status: Never Smoker   . Smokeless tobacco: Not on file  . Alcohol Use: No  . Drug Use: No  . Sexually Active: Not on file   Other Topics Concern  . Not on file   Social History Narrative  . No narrative on file    Review of Systems: Gen: SEE HPI CV: Denies chest pain, heart palpitations, peripheral edema, syncope.  Resp: Denies shortness of breath at rest or with exertion. Denies wheezing or cough.  GI: SEE HPI GU : Denies urinary burning, urinary hesitancy MS: +lupus  Derm: Denies rash, itching, dry  skin Psych: +depression Heme: Denies bruising, bleeding, and enlarged lymph nodes.  Physical Exam: BP 141/82  Pulse 84  Temp(Src) 97.2 F (36.2 C) (Oral)  Ht 5' 6" (1.676 m)  Wt 331 lb (150.141 kg)  BMI 53.45 kg/m2 General:   Alert and oriented. Pleasant and cooperative. Well-nourished and well-developed.  Head:  Normocephalic and atraumatic. Eyes:  Without icterus, sclera clear and conjunctiva pink.  Ears:  Normal auditory acuity. Nose:  No deformity, discharge,  or lesions. Mouth:  No deformity or lesions, oral mucosa pink.  Neck:  Supple, without mass or thyromegaly. Lungs:  Clear to auscultation bilaterally. No wheezes, rales, or rhonchi. No distress.  Heart:  S1, S2 present without murmurs appreciated.  Abdomen:  +BS, soft, mild TTP lower abdomen and non-distended. No HSM noted. No guarding or rebound. No masses appreciated.  Rectal:  Deferred  Msk:  Symmetrical without gross deformities. Normal posture. Extremities:  Trace lower extremity edema Neurologic:  Alert and  oriented x4;  grossly normal neurologically. Skin:  Intact without significant lesions or rashes. Cervical Nodes:  No significant cervical adenopathy. Psych:  Alert and cooperative. Normal mood and affect.    

## 2013-02-11 ENCOUNTER — Encounter (HOSPITAL_COMMUNITY): Payer: Self-pay | Admitting: Internal Medicine

## 2013-03-07 ENCOUNTER — Encounter (HOSPITAL_COMMUNITY): Payer: Self-pay

## 2013-03-07 ENCOUNTER — Other Ambulatory Visit: Payer: Self-pay | Admitting: *Deleted

## 2013-03-07 ENCOUNTER — Emergency Department (HOSPITAL_COMMUNITY)
Admission: EM | Admit: 2013-03-07 | Discharge: 2013-03-08 | Disposition: A | Discharge status: Home / Self Care | Payer: Medicare Other | Attending: Emergency Medicine | Admitting: Emergency Medicine

## 2013-03-07 ENCOUNTER — Emergency Department (HOSPITAL_COMMUNITY): Payer: Medicare Other

## 2013-03-07 DIAGNOSIS — F329 Major depressive disorder, single episode, unspecified: Secondary | ICD-10-CM | POA: Insufficient documentation

## 2013-03-07 DIAGNOSIS — E669 Obesity, unspecified: Secondary | ICD-10-CM | POA: Insufficient documentation

## 2013-03-07 DIAGNOSIS — R110 Nausea: Secondary | ICD-10-CM | POA: Insufficient documentation

## 2013-03-07 DIAGNOSIS — R109 Unspecified abdominal pain: Secondary | ICD-10-CM

## 2013-03-07 DIAGNOSIS — E119 Type 2 diabetes mellitus without complications: Secondary | ICD-10-CM

## 2013-03-07 DIAGNOSIS — F3289 Other specified depressive episodes: Secondary | ICD-10-CM | POA: Insufficient documentation

## 2013-03-07 DIAGNOSIS — R1084 Generalized abdominal pain: Secondary | ICD-10-CM | POA: Insufficient documentation

## 2013-03-07 DIAGNOSIS — Z87442 Personal history of urinary calculi: Secondary | ICD-10-CM | POA: Insufficient documentation

## 2013-03-07 DIAGNOSIS — M549 Dorsalgia, unspecified: Secondary | ICD-10-CM | POA: Insufficient documentation

## 2013-03-07 DIAGNOSIS — G2581 Restless legs syndrome: Secondary | ICD-10-CM | POA: Insufficient documentation

## 2013-03-07 DIAGNOSIS — Z9089 Acquired absence of other organs: Secondary | ICD-10-CM | POA: Insufficient documentation

## 2013-03-07 DIAGNOSIS — Z90710 Acquired absence of both cervix and uterus: Secondary | ICD-10-CM | POA: Insufficient documentation

## 2013-03-07 DIAGNOSIS — Z8639 Personal history of other endocrine, nutritional and metabolic disease: Secondary | ICD-10-CM | POA: Insufficient documentation

## 2013-03-07 DIAGNOSIS — I10 Essential (primary) hypertension: Secondary | ICD-10-CM | POA: Insufficient documentation

## 2013-03-07 DIAGNOSIS — Z8719 Personal history of other diseases of the digestive system: Secondary | ICD-10-CM | POA: Insufficient documentation

## 2013-03-07 DIAGNOSIS — Z8739 Personal history of other diseases of the musculoskeletal system and connective tissue: Secondary | ICD-10-CM | POA: Insufficient documentation

## 2013-03-07 DIAGNOSIS — R5381 Other malaise: Secondary | ICD-10-CM | POA: Insufficient documentation

## 2013-03-07 DIAGNOSIS — Z862 Personal history of diseases of the blood and blood-forming organs and certain disorders involving the immune mechanism: Secondary | ICD-10-CM | POA: Insufficient documentation

## 2013-03-07 DIAGNOSIS — Z79899 Other long term (current) drug therapy: Secondary | ICD-10-CM | POA: Insufficient documentation

## 2013-03-07 DIAGNOSIS — K219 Gastro-esophageal reflux disease without esophagitis: Secondary | ICD-10-CM | POA: Insufficient documentation

## 2013-03-07 DIAGNOSIS — R6883 Chills (without fever): Secondary | ICD-10-CM | POA: Insufficient documentation

## 2013-03-07 HISTORY — DX: Acute pancreatitis without necrosis or infection, unspecified: K85.90

## 2013-03-07 LAB — URINALYSIS, ROUTINE W REFLEX MICROSCOPIC
Glucose, UA: NEGATIVE mg/dL
Ketones, ur: NEGATIVE mg/dL
Leukocytes, UA: NEGATIVE
Protein, ur: NEGATIVE mg/dL
Urobilinogen, UA: 0.2 mg/dL (ref 0.0–1.0)

## 2013-03-07 LAB — CBC WITH DIFFERENTIAL/PLATELET
Eosinophils Absolute: 0.2 10*3/uL (ref 0.0–0.7)
Eosinophils Relative: 2 % (ref 0–5)
HCT: 41.2 % (ref 36.0–46.0)
Hemoglobin: 14.3 g/dL (ref 12.0–15.0)
Lymphocytes Relative: 40 % (ref 12–46)
Lymphs Abs: 3.3 10*3/uL (ref 0.7–4.0)
MCH: 29.2 pg (ref 26.0–34.0)
MCV: 84.3 fL (ref 78.0–100.0)
Monocytes Absolute: 0.7 10*3/uL (ref 0.1–1.0)
Monocytes Relative: 9 % (ref 3–12)
RBC: 4.89 MIL/uL (ref 3.87–5.11)
WBC: 8 10*3/uL (ref 4.0–10.5)

## 2013-03-07 LAB — COMPREHENSIVE METABOLIC PANEL
ALT: 29 U/L (ref 0–35)
BUN: 15 mg/dL (ref 6–23)
CO2: 30 mEq/L (ref 19–32)
Calcium: 9.5 mg/dL (ref 8.4–10.5)
Creatinine, Ser: 0.72 mg/dL (ref 0.50–1.10)
GFR calc Af Amer: >90 mL/min (ref >90)
GFR calc non Af Amer: >90 mL/min (ref >90)
Glucose, Bld: 114 mg/dL — ABNORMAL HIGH (ref 70–99)
Sodium: 140 mEq/L (ref 135–145)

## 2013-03-07 LAB — AMYLASE: Amylase: 42 U/L (ref 0–105)

## 2013-03-07 MED ORDER — IOHEXOL 300 MG/ML  SOLN: 50.0000 mL | Freq: Once | INTRAMUSCULAR | Status: AC | PRN

## 2013-03-07 MED ORDER — ONDANSETRON HCL 4 MG/2ML IJ SOLN
4.0000 mg | INTRAMUSCULAR | Status: AC
  Administered 2013-03-07: 4 mg via INTRAVENOUS
  Filled 2013-03-07: qty 2

## 2013-03-07 MED ORDER — MORPHINE SULFATE 4 MG/ML IJ SOLN
2.0000 mg | Freq: Once | INTRAMUSCULAR | Status: AC
  Administered 2013-03-07: 2 mg via INTRAVENOUS
  Filled 2013-03-07: qty 1

## 2013-03-07 MED ORDER — METFORMIN HCL 500 MG PO TABS: 500.0000 mg | ORAL_TABLET | Freq: Every day | ORAL | Status: DC

## 2013-03-07 MED ORDER — HYDROMORPHONE HCL PF 1 MG/ML IJ SOLN
1.0000 mg | Freq: Once | INTRAMUSCULAR | Status: AC
  Administered 2013-03-07: 1 mg via INTRAVENOUS
  Filled 2013-03-07: qty 1

## 2013-03-07 NOTE — ED Notes (Signed)
Pt desat to 83% at 2345. Pt placed on 2LNC, maintaining SpO2 98%-100%. Respiration rate 16. Pt alert and interactive.

## 2013-03-07 NOTE — ED Notes (Signed)
Pt returned from radiology.

## 2013-03-07 NOTE — ED Provider Notes (Signed)
History     CSN: [id number]  Arrival date & time 03/07/13  1842   First MD Initiated Contact with Patient 03/07/13 2009      Chief Complaint  Patient presents with  . Abdominal Pain    (Consider location/radiation/quality/duration/timing/severity/associated sxs/prior treatment) HPI   Patient is a 52 year old female past medical history significant for IBS presenting for 2 days of generalized constant sharp 8/10 abdominal pain with radiation to lower back. Has associated nausea but denies any vomiting or diarrhea. Patient moved her bowels this afternoon with no blood noted, but had not had a BM a few days prior to that. Denies any vaginal pain or discharge, or urinary symptoms. Patient and colonoscopy done on 02/06/2013 was inconclusive. States she does not typically have pain with her IBS.    Past Medical History  Diagnosis Date  . Lupus   . Diabetes mellitus without complication   . Hypertension   . Kidney stone   . Depression   . GERD (gastroesophageal reflux disease)   . Restless leg   . IBS (irritable bowel syndrome)   . Graves disease     s/p radioactive therapy  . Pancreatitis     Past Surgical History  Procedure Laterality Date  . Cholecystectomy    . Abdominal hysterectomy    . Colonoscopy  2006    RMR: internal/external hemorrhoids, proctitis  . Colonoscopy N/A 02/06/2013    Procedure: COLONOSCOPY;  Surgeon: Corbin Ade, MD;  Location: [facility];  Service: Endoscopy;  Laterality: N/A;  2:00    Family History  Problem Relation Age of Onset  . Colon cancer Father     patient was adopted, but she knows birth father. Believes he had colon cancer    History  Substance Use Topics  . Smoking status: Never Smoker   . Smokeless tobacco: Not on file  . Alcohol Use: No    OB History   Grav Para Term Preterm Abortions TAB SAB Ect Mult Living                  Review of Systems  Constitutional: Positive for chills and fatigue.  HENT: Negative.    Eyes: Negative.   Respiratory: Negative.   Cardiovascular: Negative.   Gastrointestinal: Positive for nausea and abdominal pain. Negative for vomiting, diarrhea, blood in stool, anal bleeding and rectal pain.  Genitourinary: Negative for dysuria, flank pain, vaginal bleeding, vaginal discharge and vaginal pain.  Musculoskeletal: Positive for back pain.  Skin: Negative.   Neurological: Negative.     Allergies  Review of patient's allergies indicates no known allergies.  Home Medications   Current Outpatient Rx  Name  Route  Sig  Dispense  Refill  . amLODipine (NORVASC) 5 MG tablet   Oral   Take 5 mg by mouth daily.         Marland Kitchen atorvastatin (LIPITOR) 40 MG tablet   Oral   Take 40 mg by mouth daily.         Marland Kitchen dexlansoprazole (DEXILANT) 60 MG capsule   Oral   Take 60 mg by mouth daily.          . DULoxetine (CYMBALTA) 60 MG capsule   Oral   Take 60 mg by mouth 2 (two) times daily.          . fluticasone (FLONASE) 50 MCG/ACT nasal spray   Nasal   Place 2 sprays into the nose as needed for rhinitis or allergies.         Marland Kitchen  hydrochlorothiazide (HYDRODIURIL) 25 MG tablet   Oral   Take 25 mg by mouth daily.         . hydroxychloroquine (PLAQUENIL) 200 MG tablet   Oral   Take 200 mg by mouth daily.         Marland Kitchen levothyroxine (SYNTHROID, LEVOTHROID) 200 MCG tablet   Oral   Take 200 mcg by mouth daily.         Marland Kitchen lisinopril (PRINIVIL,ZESTRIL) 40 MG tablet   Oral   Take 40 mg by mouth daily.         . metFORMIN (GLUCOPHAGE) 500 MG tablet   Oral   Take 1 tablet (500 mg total) by mouth daily with breakfast.   30 tablet   1   . HYDROcodone-acetaminophen (NORCO/VICODIN) 5-325 MG per tablet   Oral   Take 1 tablet by mouth every 6 (six) hours as needed for pain.   15 tablet   0   . promethazine (PHENERGAN) 25 MG tablet   Oral   Take 1 tablet (25 mg total) by mouth every 6 (six) hours as needed for nausea.   15 tablet   0     BP 126/57  Pulse 74   Temp(Src) 98.4 F (36.9 C) (Oral)  Resp 14  SpO2 97%  Physical Exam  Constitutional: She is oriented to person, place, and time. She appears well-developed and well-nourished.  Non-toxic appearance.  HENT:  Head: Normocephalic and atraumatic.  Eyes: EOM are normal. Pupils are equal, round, and reactive to light.  Neck: Neck supple.  Cardiovascular: Normal rate, regular rhythm and normal heart sounds.   Pulmonary/Chest: Effort normal and breath sounds normal.  Abdominal: Soft. Bowel sounds are normal. There is generalized tenderness. There is no rigidity, no guarding and no CVA tenderness.  Obese abdomen  Neurological: She is alert and oriented to person, place, and time.  Skin: Skin is warm and dry.  Psychiatric: She has a normal mood and affect.    ED Course  Procedures (including critical care time)   Date: 03/08/2013  Rate: 80  Rhythm: normal sinus rhythm  QRS Axis: normal  Intervals: normal  ST/T Wave abnormalities: normal and nonspecific T wave changes  Conduction Disutrbances:none  Narrative Interpretation:   Old EKG Reviewed: none available    Labs Reviewed  COMPREHENSIVE METABOLIC PANEL - Abnormal; Notable for the following:    Glucose, Bld 114 (*)    All other components within normal limits  URINALYSIS, ROUTINE W REFLEX MICROSCOPIC - Abnormal; Notable for the following:    APPearance CLOUDY (*)    All other components within normal limits  CBC WITH DIFFERENTIAL  AMYLASE  LIPASE, BLOOD  POCT I-STAT TROPONIN I   Ct Abdomen Pelvis W Contrast  03/08/2013  *RADIOLOGY REPORT*  Clinical Data: Abdominal pain all over.  Nausea.  CT ABDOMEN AND PELVIS WITH CONTRAST  Technique:  Multidetector CT imaging of the abdomen and pelvis was performed following the standard protocol during bolus administration of intravenous contrast.  Contrast: OMNIPAQUE IOHEXOL 300 MG/ML  SOLN  Comparison: 04/08/2009  Findings: The lung bases are clear.  Diffuse fatty infiltration of the  liver.  Surgical absence of the gallbladder.  The pancreas, spleen, adrenal glands, abdominal aorta, inferior vena cava, retroperitoneal lymph nodes, stomach, small bowel, and colon appear unremarkable.  Stone in the lower pole of the right kidney measuring 4 mm without obstruction.  Small parapelvic cysts in the left kidney.  No hydronephrosis or solid mass in either kidney.  No free air or free fluid in the abdomen.  Pelvis:  Bladder wall is not thickened.  Surgical absence of the uterus.  No abnormal adnexal masses.  No significant pelvic lymphadenopathy.  The appendix is normal.  No diverticulitis. Degenerative changes in the lumbar spine.  IMPRESSION: Diffuse fatty infiltration of the liver.  Nonobstructing stone in the lower pole of the right kidney.  No acute process demonstrated in the abdomen or pelvis.  No significant change.   Original Report Authenticated By: Burman Nieves, M.D.    Dg Abd Acute W/chest  03/07/2013  *RADIOLOGY REPORT*  Clinical Data: Generalized abdominal pain radiating into the low back on the right side.  Nausea.  ACUTE ABDOMEN SERIES (ABDOMEN 2 VIEW & CHEST 1 VIEW)  Comparison: 09/19/2009.  Findings: Lungs clear.  Cardiopericardial silhouette within normal limits.  Trachea midline.  No airspace disease or effusion. Bowel gas pattern is within normal limits.  No pathologic air fluid levels are identified.    Cholecystectomy clips are present in the right upper quadrant.  Dropped clip in the pelvis.  IMPRESSION: No acute abnormality.   Original Report Authenticated By: Andreas Newport, M.D.      1. Abdominal pain       MDM  Patient is nontoxic, nonseptic appearing, in no apparent distress.  Patient's pain and other symptoms adequately managed in emergency department.  Fluid bolus given.  Labs, imaging and vitals reviewed.  Patient does not meet the SIRS or Sepsis criteria.  Patient care transferred to Dr. Estell Harpin at shift change pending CT w/ contrast of abdomen and pelvis.  Patient would be discharged pending CT scan results. Patient d/w with Dr. Ethelda Chick, agrees with plan.          Jeannetta Ellis, PA-C 03/08/13 [phone]

## 2013-03-07 NOTE — ED Notes (Signed)
General abdominal pain radiates into  Lower back with nausea , denies any vomiting or diarrhea .  Pt. Moved her bowels this afternoon, normal no blood noted. Pt. Having pressure when she voids denies any vaginal discharge or pain

## 2013-03-07 NOTE — ED Provider Notes (Signed)
Complaint of diffuse abdominal pain for 2 days with nausea, no vomiting last bowel movement today, normal nothing makes pain better or worse pain is nonexertional she admits to mild shortness of breath accompanying symptoms. No fever. Nontoxic alert lungs clear auscultation heart regular rate rhythm no murmurs abdomen morbidly obese mild diffuse tenderness no guarding, normoactive bowelsounds  Date: 03/07/2013  Rate: 80  Rhythm: normal sinus rhythm  QRS Axis: normal  Intervals: normal  ST/T Wave abnormalities: normal and nonspecific T wave changes  Conduction Disutrbances:none  Narrative Interpretation:   Old EKG Reviewed: none available    Doug Sou, MD 03/08/13 0202

## 2013-03-07 NOTE — ED Notes (Signed)
Dr. Shela Commons. At bedside. Pt alert and interactive

## 2013-03-08 ENCOUNTER — Encounter (HOSPITAL_COMMUNITY): Payer: Self-pay | Admitting: Radiology

## 2013-03-08 ENCOUNTER — Emergency Department (HOSPITAL_COMMUNITY): Payer: Medicare Other

## 2013-03-08 LAB — POCT I-STAT TROPONIN I: Troponin i, poc: 0 ng/mL (ref 0.00–0.08)

## 2013-03-08 MED ORDER — IOHEXOL 300 MG/ML  SOLN
100.0000 mL | Freq: Once | INTRAMUSCULAR | Status: AC | PRN
  Administered 2013-03-08: 100 mL via INTRAVENOUS

## 2013-03-08 MED ORDER — IOHEXOL 300 MG/ML  SOLN
50.0000 mL | Freq: Once | INTRAMUSCULAR | Status: AC | PRN
  Administered 2013-03-08: 50 mL via ORAL

## 2013-03-08 MED ORDER — HYDROCODONE-ACETAMINOPHEN 5-325 MG PO TABS: 1.0000 | ORAL_TABLET | Freq: Four times a day (QID) | ORAL | Status: DC | PRN

## 2013-03-08 MED ORDER — PROMETHAZINE HCL 25 MG PO TABS: 25.0000 mg | ORAL_TABLET | Freq: Four times a day (QID) | ORAL | Status: DC | PRN

## 2013-03-08 MED ORDER — WARFARIN SODIUM 10 MG PO TABS: 10.0000 mg | ORAL_TABLET | Freq: Once | ORAL | Status: DC

## 2013-03-08 NOTE — ED Provider Notes (Signed)
Medical screening examination/treatment/procedure(s) were conducted as a shared visit with non-physician practitioner(s) and myself.  I personally evaluated the patient during the encounter  Aide Wojnar, MD 03/08/13 1458 

## 2013-03-08 NOTE — ED Notes (Signed)
Pt comfortable with d/c and f/u instructions. Prescriptions x2. Pt verbalized to this RN and to EDP will have family member pick pt up.

## 2013-03-08 NOTE — ED Notes (Signed)
Pt returned from CT °

## 2013-03-14 ENCOUNTER — Ambulatory Visit (INDEPENDENT_AMBULATORY_CARE_PROVIDER_SITE_OTHER): Payer: Medicare Other | Admitting: Gastroenterology

## 2013-03-14 ENCOUNTER — Encounter: Payer: Self-pay | Admitting: Gastroenterology

## 2013-03-14 VITALS — BP 117/70 | HR 91 | Temp 97.9°F | Ht 64.0 in | Wt 329.8 lb

## 2013-03-14 DIAGNOSIS — R1013 Epigastric pain: Secondary | ICD-10-CM | POA: Insufficient documentation

## 2013-03-14 MED ORDER — DEXLANSOPRAZOLE 60 MG PO CPDR: 60.0000 mg | DELAYED_RELEASE_CAPSULE | Freq: Two times a day (BID) | ORAL | Status: DC

## 2013-03-14 NOTE — Progress Notes (Signed)
Referring Provider: Ernestina Penna, MD Primary Care Physician:  Rudi Heap, MD Primary GI: Dr. Jena Gauss    Chief Complaint  Patient presents with  . Follow-up    HPI:   [name] returns today in follow-up after colonoscopy April 2014, which showed anal canal hemorrhoids and inadequate prep. She is due for colonoscopy again in April 2015.  Last seen March 2014 due to rectal bleeding. History of GERD, fatty liver. Seen in the ED earlier this month due to upper abdominal pain that radiated down and around to back but mainly upper.  CT negative for acute process.  States had been lingering for several days, progressing to worse. No vomiting, just nausea. No diarrhea. Had noted constipation a few days prior, but then she had a bowel movement. On Dexilant for GERD. States appetite was poor when she presented to the ED. Had only eaten a few crackers. Lipase normal, amylase normal, LFTs normal, urinalysis normal. Stool has been light brown. Lately having a BM 2-3 times per day. No diarrhea but very soft. Stool is not foul-smelling, non-greasy, notes a large amount of gas. Still feels some discomfort but not like it was. No sick contacts.  Takes Ibuprofen for headaches every few weeks but not routinely. No melena. No further rectal bleeding.    Past Medical History  Diagnosis Date  . Lupus   . Diabetes mellitus without complication   . Hypertension   . Kidney stone   . Depression   . GERD (gastroesophageal reflux disease)   . Restless leg   . IBS (irritable bowel syndrome)   . Graves disease     s/p radioactive therapy  . Pancreatitis     Past Surgical History  Procedure Laterality Date  . Cholecystectomy    . Abdominal hysterectomy    . Colonoscopy  2006    RMR: internal/external hemorrhoids, proctitis  . Colonoscopy N/A 02/06/2013    RMR: anal canal hemorrhoids, inadequate prep. Needs screening again in April 2015    Current Outpatient Prescriptions  Medication Sig Dispense Refill   . amLODipine (NORVASC) 5 MG tablet Take 5 mg by mouth daily.      Marland Kitchen atorvastatin (LIPITOR) 40 MG tablet Take 40 mg by mouth daily.      Marland Kitchen dexlansoprazole (DEXILANT) 60 MG capsule Take 60 mg by mouth daily.       . DULoxetine (CYMBALTA) 60 MG capsule Take 60 mg by mouth 2 (two) times daily.       . fluticasone (FLONASE) 50 MCG/ACT nasal spray Place 2 sprays into the nose as needed for rhinitis or allergies.      . hydrochlorothiazide (HYDRODIURIL) 25 MG tablet Take 25 mg by mouth daily.      . hydroxychloroquine (PLAQUENIL) 200 MG tablet Take 200 mg by mouth daily.      Marland Kitchen levothyroxine (SYNTHROID, LEVOTHROID) 200 MCG tablet Take 200 mcg by mouth daily.      Marland Kitchen lisinopril (PRINIVIL,ZESTRIL) 40 MG tablet Take 40 mg by mouth daily.      . metFORMIN (GLUCOPHAGE) 500 MG tablet Take 1 tablet (500 mg total) by mouth daily with breakfast.  30 tablet  1  . promethazine (PHENERGAN) 25 MG tablet Take 1 tablet (25 mg total) by mouth every 6 (six) hours as needed for nausea.  15 tablet  0  . HYDROcodone-acetaminophen (NORCO/VICODIN) 5-325 MG per tablet Take 1 tablet by mouth every 6 (six) hours as needed for pain.  15 tablet  0   No current  facility-administered medications for this visit.    Allergies as of 03/14/2013  . (No Known Allergies)    Family History  Problem Relation Age of Onset  . Colon cancer Father     patient was adopted, but she knows birth father. Believes he had colon cancer    History   Social History  . Marital Status: Divorced    Spouse Name: N/A    Number of Children: N/A  . Years of Education: N/A   Social History Main Topics  . Smoking status: Never Smoker   . Smokeless tobacco: None  . Alcohol Use: No  . Drug Use: No  . Sexually Active: None   Other Topics Concern  . None   Social History Narrative  . None    Review of Systems: Negative unless mentioned in HPI  Physical Exam: BP 117/70  Pulse 91  Temp(Src) 97.9 F (36.6 C) (Oral)  Ht 5\' 4"  (1.626  m)  Wt 329 lb 12.8 oz (149.596 kg)  BMI 56.58 kg/m2 General:   Alert and oriented. No distress noted. Pleasant and cooperative.  Head:  Normocephalic and atraumatic. Eyes:  Conjuctiva clear without scleral icterus. Mouth:  Oral mucosa pink and moist. Good dentition. No lesions. Heart:  S1, S2 present without murmurs, rubs, or gallops. Regular rate and rhythm. Abdomen:  +BS, soft, obese, very mildly tender to palpation epigastric region and non-distended. No rebound or guarding. No HSM or masses noted. Msk:  Symmetrical without gross deformities. Normal posture. Extremities:  Without edema. Neurologic:  Alert and  oriented x4;  grossly normal neurologically. Skin:  Intact without significant lesions or rashes. Psych:  Alert and cooperative. Normal mood and affect.

## 2013-03-14 NOTE — Patient Instructions (Addendum)
For now, increase Dexilant to twice a day for 1 month. Call us in about 10-14 days with an update.  Please complete the stool test and return to our office.  We will definitely see you back in 3 months, but we will know more what direction to go in after the lab results and your response to the Dexilant.

## 2013-03-14 NOTE — Assessment & Plan Note (Signed)
52 year old with acute onset of epigastric pain recently resulting in ED visit; CT overall negative, benign labs to include lipase, amylase, and LFTs. Gallbladder absent. No association with eating or bowel habits. Patient already on Dexilant daily. Unclear etiology, but she does not steady improvement since ED visit. For now, increase Dexilant to BID and call in 10-14 days with an update. If continued dyspepsia, consider EGD to evaluate for gastritis, PUD. Appears she also has more frequent stools; obtain stool studies. Needs updated colonoscopy in April 2015 with 2 days of clear liquids prior and additional 1/2 gallon of prep due to poor prep recently. Return in 3 months regardless.

## 2013-03-15 NOTE — Progress Notes (Signed)
Cc PCP 

## 2013-03-29 ENCOUNTER — Telehealth: Payer: Self-pay | Admitting: Internal Medicine

## 2013-03-29 NOTE — Telephone Encounter (Signed)
For the last two days when she has a bowel movement shes having loose green stool, patient wants to speak to the nurse

## 2013-03-29 NOTE — Telephone Encounter (Signed)
Spoke with pt- she has had 2 stools which were green, one was watery and one was soft. No pain, no N/V, no fever, no blood in stool, no other symptoms except the color of the stool. Pt stated she is under a tremendous amount of stress. Checked her stool study and everything was negative. Advised pt that if she were to develop any of the above symptoms, she should call us or go to ED. Pt said she couldn't tell if the bid  dexilant was working or not d/t the amount of stress she is under.

## 2013-04-02 NOTE — Telephone Encounter (Signed)
Offer routine follow-up visit to discuss EGD.

## 2013-04-02 NOTE — Telephone Encounter (Signed)
Susan, please schedule ov 

## 2013-04-09 ENCOUNTER — Encounter: Payer: Self-pay | Admitting: Gastroenterology

## 2013-04-09 NOTE — Telephone Encounter (Signed)
Pt is aware of OV on 6/24 at 0945 with [facility] and appt card was mailed

## 2013-04-18 ENCOUNTER — Other Ambulatory Visit: Payer: Self-pay | Admitting: Nurse Practitioner

## 2013-04-18 ENCOUNTER — Other Ambulatory Visit: Payer: Self-pay | Admitting: Internal Medicine

## 2013-04-23 ENCOUNTER — Other Ambulatory Visit: Payer: Self-pay | Admitting: *Deleted

## 2013-04-23 MED ORDER — ATORVASTATIN CALCIUM 40 MG PO TABS: 40.0000 mg | ORAL_TABLET | Freq: Every day | ORAL | Status: DC

## 2013-04-30 ENCOUNTER — Ambulatory Visit: Payer: Medicare Other | Admitting: Gastroenterology

## 2013-05-08 ENCOUNTER — Encounter (HOSPITAL_COMMUNITY): Payer: Self-pay | Admitting: Adult Health

## 2013-05-08 ENCOUNTER — Emergency Department (HOSPITAL_COMMUNITY)
Admission: EM | Admit: 2013-05-08 | Discharge: 2013-05-08 | Disposition: A | Discharge status: Home / Self Care | Payer: No Typology Code available for payment source | Attending: Emergency Medicine | Admitting: Emergency Medicine

## 2013-05-08 ENCOUNTER — Emergency Department (HOSPITAL_COMMUNITY): Payer: No Typology Code available for payment source

## 2013-05-08 DIAGNOSIS — M329 Systemic lupus erythematosus, unspecified: Secondary | ICD-10-CM | POA: Insufficient documentation

## 2013-05-08 DIAGNOSIS — F329 Major depressive disorder, single episode, unspecified: Secondary | ICD-10-CM | POA: Insufficient documentation

## 2013-05-08 DIAGNOSIS — Z8719 Personal history of other diseases of the digestive system: Secondary | ICD-10-CM | POA: Insufficient documentation

## 2013-05-08 DIAGNOSIS — I10 Essential (primary) hypertension: Secondary | ICD-10-CM | POA: Diagnosis not present

## 2013-05-08 DIAGNOSIS — S01409A Unspecified open wound of unspecified cheek and temporomandibular area, initial encounter: Secondary | ICD-10-CM | POA: Insufficient documentation

## 2013-05-08 DIAGNOSIS — S20211A Contusion of right front wall of thorax, initial encounter: Secondary | ICD-10-CM

## 2013-05-08 DIAGNOSIS — E119 Type 2 diabetes mellitus without complications: Secondary | ICD-10-CM | POA: Insufficient documentation

## 2013-05-08 DIAGNOSIS — E0500 Thyrotoxicosis with diffuse goiter without thyrotoxic crisis or storm: Secondary | ICD-10-CM | POA: Insufficient documentation

## 2013-05-08 DIAGNOSIS — S20219A Contusion of unspecified front wall of thorax, initial encounter: Secondary | ICD-10-CM | POA: Insufficient documentation

## 2013-05-08 DIAGNOSIS — R51 Headache: Secondary | ICD-10-CM | POA: Insufficient documentation

## 2013-05-08 DIAGNOSIS — S0093XA Contusion of unspecified part of head, initial encounter: Secondary | ICD-10-CM

## 2013-05-08 DIAGNOSIS — S301XXA Contusion of abdominal wall, initial encounter: Secondary | ICD-10-CM | POA: Diagnosis not present

## 2013-05-08 DIAGNOSIS — R000 Tachycardia, unspecified: Secondary | ICD-10-CM | POA: Diagnosis not present

## 2013-05-08 DIAGNOSIS — IMO0002 Reserved for concepts with insufficient information to code with codable children: Secondary | ICD-10-CM | POA: Diagnosis not present

## 2013-05-08 DIAGNOSIS — Z87442 Personal history of urinary calculi: Secondary | ICD-10-CM | POA: Diagnosis not present

## 2013-05-08 DIAGNOSIS — Y92410 Unspecified street and highway as the place of occurrence of the external cause: Secondary | ICD-10-CM | POA: Diagnosis not present

## 2013-05-08 DIAGNOSIS — K219 Gastro-esophageal reflux disease without esophagitis: Secondary | ICD-10-CM | POA: Insufficient documentation

## 2013-05-08 DIAGNOSIS — S51009A Unspecified open wound of unspecified elbow, initial encounter: Secondary | ICD-10-CM | POA: Diagnosis not present

## 2013-05-08 DIAGNOSIS — F3289 Other specified depressive episodes: Secondary | ICD-10-CM | POA: Insufficient documentation

## 2013-05-08 DIAGNOSIS — Z23 Encounter for immunization: Secondary | ICD-10-CM | POA: Diagnosis not present

## 2013-05-08 DIAGNOSIS — G2581 Restless legs syndrome: Secondary | ICD-10-CM | POA: Insufficient documentation

## 2013-05-08 DIAGNOSIS — S0083XA Contusion of other part of head, initial encounter: Secondary | ICD-10-CM | POA: Diagnosis not present

## 2013-05-08 DIAGNOSIS — Z79899 Other long term (current) drug therapy: Secondary | ICD-10-CM | POA: Insufficient documentation

## 2013-05-08 DIAGNOSIS — S0181XA Laceration without foreign body of other part of head, initial encounter: Secondary | ICD-10-CM

## 2013-05-08 DIAGNOSIS — Y998 Other external cause status: Secondary | ICD-10-CM | POA: Diagnosis not present

## 2013-05-08 DIAGNOSIS — S0003XA Contusion of scalp, initial encounter: Secondary | ICD-10-CM | POA: Insufficient documentation

## 2013-05-08 DIAGNOSIS — S41111A Laceration without foreign body of right upper arm, initial encounter: Secondary | ICD-10-CM

## 2013-05-08 LAB — CBC WITH DIFFERENTIAL/PLATELET
Basophils Relative: 0 % (ref 0–1)
Eosinophils Absolute: 0.2 10*3/uL (ref 0.0–0.7)
Eosinophils Relative: 1 % (ref 0–5)
Lymphs Abs: 3 10*3/uL (ref 0.7–4.0)
MCH: 29.8 pg (ref 26.0–34.0)
MCHC: 34.7 g/dL (ref 30.0–36.0)
MCV: 86 fL (ref 78.0–100.0)
Neutrophils Relative %: 74 % (ref 43–77)
Platelets: 216 10*3/uL (ref 150–400)
RBC: 5.13 MIL/uL — ABNORMAL HIGH (ref 3.87–5.11)

## 2013-05-08 LAB — COMPREHENSIVE METABOLIC PANEL
AST: 31 U/L (ref 0–37)
CO2: 28 mEq/L (ref 19–32)
Calcium: 9.3 mg/dL (ref 8.4–10.5)
Creatinine, Ser: 0.64 mg/dL (ref 0.50–1.10)
GFR calc Af Amer: >90 mL/min (ref >90)
GFR calc non Af Amer: >90 mL/min (ref >90)
Glucose, Bld: 152 mg/dL — ABNORMAL HIGH (ref 70–99)
Sodium: 138 mEq/L (ref 135–145)
Total Protein: 7.2 g/dL (ref 6.0–8.3)

## 2013-05-08 LAB — PROTIME-INR: INR: 0.9 (ref 0.00–1.49)

## 2013-05-08 LAB — TYPE AND SCREEN
ABO/RH(D): A POS
Antibody Screen: NEGATIVE

## 2013-05-08 LAB — POCT I-STAT, CHEM 8
Calcium, Ion: 1.11 mmol/L — ABNORMAL LOW (ref 1.12–1.23)
HCT: 48 % — ABNORMAL HIGH (ref 36.0–46.0)
Hemoglobin: 16.3 g/dL — ABNORMAL HIGH (ref 12.0–15.0)
Sodium: 141 mEq/L (ref 135–145)
TCO2: 27 mmol/L (ref 0–100)

## 2013-05-08 LAB — LIPASE, BLOOD: Lipase: 21 U/L (ref 11–59)

## 2013-05-08 LAB — CG4 I-STAT (LACTIC ACID): Lactic Acid, Venous: 2.08 mmol/L (ref 0.5–2.2)

## 2013-05-08 MED ORDER — ONDANSETRON HCL 4 MG/2ML IJ SOLN
INTRAMUSCULAR | Status: AC
  Filled 2013-05-08: qty 2

## 2013-05-08 MED ORDER — HYDROMORPHONE HCL PF 1 MG/ML IJ SOLN
1.0000 mg | Freq: Once | INTRAMUSCULAR | Status: AC
  Administered 2013-05-08: 1 mg via INTRAVENOUS
  Filled 2013-05-08: qty 1

## 2013-05-08 MED ORDER — ONDANSETRON HCL 4 MG/2ML IJ SOLN
4.0000 mg | Freq: Once | INTRAMUSCULAR | Status: AC
  Administered 2013-05-08: 4 mg via INTRAVENOUS

## 2013-05-08 MED ORDER — IBUPROFEN 600 MG PO TABS: 600.0000 mg | ORAL_TABLET | Freq: Three times a day (TID) | ORAL | Status: DC | PRN

## 2013-05-08 MED ORDER — CEPHALEXIN 500 MG PO CAPS: 500.0000 mg | ORAL_CAPSULE | Freq: Two times a day (BID) | ORAL | Status: DC

## 2013-05-08 MED ORDER — IOHEXOL 300 MG/ML  SOLN
100.0000 mL | Freq: Once | INTRAMUSCULAR | Status: AC | PRN
  Administered 2013-05-08: 100 mL via INTRAVENOUS

## 2013-05-08 MED ORDER — ONDANSETRON HCL 4 MG/2ML IJ SOLN
4.0000 mg | Freq: Once | INTRAMUSCULAR | Status: AC
  Administered 2013-05-08: 4 mg via INTRAVENOUS
  Filled 2013-05-08: qty 2

## 2013-05-08 MED ORDER — TETANUS-DIPHTH-ACELL PERTUSSIS 5-2.5-18.5 LF-MCG/0.5 IM SUSP
INTRAMUSCULAR | Status: AC
  Filled 2013-05-08: qty 0.5

## 2013-05-08 MED ORDER — OXYCODONE-ACETAMINOPHEN 5-325 MG PO TABS: 2.0000 | ORAL_TABLET | ORAL | Status: DC | PRN

## 2013-05-08 MED ORDER — SODIUM CHLORIDE 0.9 % IV BOLUS (SEPSIS)
1000.0000 mL | Freq: Once | INTRAVENOUS | Status: AC
  Administered 2013-05-08: 1000 mL via INTRAVENOUS

## 2013-05-08 MED ORDER — TETANUS-DIPHTH-ACELL PERTUSSIS 5-2.5-18.5 LF-MCG/0.5 IM SUSP
0.5000 mL | Freq: Once | INTRAMUSCULAR | Status: AC
  Administered 2013-05-08: 0.5 mL via INTRAMUSCULAR
  Filled 2013-05-08: qty 0.5

## 2013-05-08 NOTE — ED Notes (Signed)
Pt in radiology 

## 2013-05-08 NOTE — ED Notes (Signed)
Pt to CT

## 2013-05-08 NOTE — ED Notes (Signed)
Pt in radiology at this time. 

## 2013-05-08 NOTE — ED Notes (Signed)
Family at bedside. 
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
## 2013-05-08 NOTE — ED Notes (Signed)
Dr. Gary Fleet at bedside.

## 2013-05-08 NOTE — ED Notes (Signed)
Pt rails in the up position.  Pt remains alert
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
## 2013-05-13 ENCOUNTER — Telehealth (HOSPITAL_COMMUNITY): Payer: Self-pay | Admitting: Emergency Medicine

## 2013-05-13 NOTE — Telephone Encounter (Signed)
Left message suggesting she f/u with PCP but will make appt if she wants to come see [place].

## 2013-05-16 ENCOUNTER — Other Ambulatory Visit: Payer: Self-pay | Admitting: Physician Assistant

## 2013-05-16 ENCOUNTER — Ambulatory Visit (INDEPENDENT_AMBULATORY_CARE_PROVIDER_SITE_OTHER): Payer: Medicare Other | Admitting: Gastroenterology

## 2013-05-16 ENCOUNTER — Encounter: Payer: Self-pay | Admitting: Gastroenterology

## 2013-05-16 VITALS — BP 149/78 | HR 89 | Temp 98.2°F | Ht 65.0 in | Wt 345.0 lb

## 2013-05-16 DIAGNOSIS — Z8719 Personal history of other diseases of the digestive system: Secondary | ICD-10-CM

## 2013-05-16 DIAGNOSIS — K219 Gastro-esophageal reflux disease without esophagitis: Secondary | ICD-10-CM
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
## 2013-05-16 NOTE — Assessment & Plan Note (Signed)
Epigastric discomfort resolved; noted improvement with Dexilant BID since May. Decrease to once daily now. Discussed weight loss, as patient has gained 16 lbs. Will return in March 2015 to set up TCS. No need for EGD unless recurrent dyspepsia.

## 2013-05-16 NOTE — Patient Instructions (Addendum)
Decrease Dexilant back to once daily.   We will see you back in March 2015 to set up the colonoscopy.   Please call if you have any issues with recurrent loose stools, abdominal pain, worsening reflux.   Have a wonderful Summer!

## 2013-05-16 NOTE — Progress Notes (Signed)
Cc PCP 

## 2013-05-22 LAB — CLOSTRIDIUM DIFFICILE BY PCR: C difficile Toxins A+B, EIA: NEGATIVE

## 2013-09-04 ENCOUNTER — Other Ambulatory Visit: Payer: Self-pay | Admitting: Nurse Practitioner

## 2013-09-12 ENCOUNTER — Other Ambulatory Visit: Payer: Self-pay

## 2013-10-25 ENCOUNTER — Other Ambulatory Visit: Payer: Self-pay | Admitting: Family Medicine

## 2013-10-28 ENCOUNTER — Encounter: Payer: Self-pay | Admitting: Internal Medicine

## 2013-10-28 NOTE — Telephone Encounter (Signed)
No lipitor refill until has appointment

## 2013-10-28 NOTE — Telephone Encounter (Signed)
Last lipid 01/16/13  ACM

## 2013-11-06 ENCOUNTER — Other Ambulatory Visit: Payer: Self-pay | Admitting: Nurse Practitioner

## 2014-01-05 ENCOUNTER — Other Ambulatory Visit: Payer: Self-pay | Admitting: Gastroenterology

## 2014-03-19 ENCOUNTER — Other Ambulatory Visit: Payer: Self-pay | Admitting: Family Medicine

## 2014-03-19 NOTE — Telephone Encounter (Signed)
Has not been seen in over a year with andre

## 2014-05-07 LAB — HM MAMMOGRAPHY

## 2014-08-25 ENCOUNTER — Other Ambulatory Visit: Payer: Self-pay

## 2014-08-25 MED ORDER — DEXLANSOPRAZOLE 60 MG PO CPDR: 60.0000 mg | DELAYED_RELEASE_CAPSULE | Freq: Every day | ORAL | Status: DC

## 2014-09-16 ENCOUNTER — Other Ambulatory Visit: Payer: Self-pay

## 2014-09-16 MED ORDER — DEXLANSOPRAZOLE 60 MG PO CPDR: 60.0000 mg | DELAYED_RELEASE_CAPSULE | Freq: Every day | ORAL | Status: DC

## 2014-09-30 ENCOUNTER — Other Ambulatory Visit: Payer: Self-pay

## 2014-09-30 MED ORDER — DEXLANSOPRAZOLE 60 MG PO CPDR: 60.0000 mg | DELAYED_RELEASE_CAPSULE | Freq: Two times a day (BID) | ORAL | Status: DC

## 2014-10-13 ENCOUNTER — Other Ambulatory Visit: Payer: Self-pay

## 2014-10-14 MED ORDER — DEXLANSOPRAZOLE 60 MG PO CPDR: 60.0000 mg | DELAYED_RELEASE_CAPSULE | Freq: Two times a day (BID) | ORAL | Status: DC

## 2014-11-22 ENCOUNTER — Other Ambulatory Visit: Payer: Self-pay | Admitting: Family Medicine

## 2014-11-22 ENCOUNTER — Other Ambulatory Visit: Payer: Self-pay | Admitting: Nurse Practitioner

## 2014-12-01 ENCOUNTER — Encounter (HOSPITAL_COMMUNITY): Payer: Self-pay | Admitting: Emergency Medicine

## 2014-12-01 ENCOUNTER — Emergency Department (HOSPITAL_COMMUNITY)
Admission: EM | Admit: 2014-12-01 | Discharge: 2014-12-01 | Disposition: A | Discharge status: Home / Self Care | Payer: Medicare Other | Attending: Emergency Medicine | Admitting: Emergency Medicine

## 2014-12-01 ENCOUNTER — Emergency Department (HOSPITAL_COMMUNITY): Payer: Medicare Other

## 2014-12-01 DIAGNOSIS — Z8739 Personal history of other diseases of the musculoskeletal system and connective tissue: Secondary | ICD-10-CM | POA: Insufficient documentation

## 2014-12-01 DIAGNOSIS — K219 Gastro-esophageal reflux disease without esophagitis: Secondary | ICD-10-CM | POA: Insufficient documentation

## 2014-12-01 DIAGNOSIS — Z79899 Other long term (current) drug therapy: Secondary | ICD-10-CM | POA: Insufficient documentation

## 2014-12-01 DIAGNOSIS — F329 Major depressive disorder, single episode, unspecified: Secondary | ICD-10-CM | POA: Insufficient documentation

## 2014-12-01 DIAGNOSIS — R1084 Generalized abdominal pain: Secondary | ICD-10-CM

## 2014-12-01 DIAGNOSIS — E119 Type 2 diabetes mellitus without complications: Secondary | ICD-10-CM | POA: Insufficient documentation

## 2014-12-01 DIAGNOSIS — N8320 Unspecified ovarian cysts: Secondary | ICD-10-CM | POA: Diagnosis not present

## 2014-12-01 DIAGNOSIS — N949 Unspecified condition associated with female genital organs and menstrual cycle: Secondary | ICD-10-CM

## 2014-12-01 DIAGNOSIS — I10 Essential (primary) hypertension: Secondary | ICD-10-CM | POA: Diagnosis not present

## 2014-12-01 DIAGNOSIS — Z87442 Personal history of urinary calculi: Secondary | ICD-10-CM | POA: Insufficient documentation

## 2014-12-01 DIAGNOSIS — R1010 Upper abdominal pain, unspecified: Secondary | ICD-10-CM | POA: Diagnosis present

## 2014-12-01 LAB — COMPREHENSIVE METABOLIC PANEL
ALBUMIN: 4.4 g/dL (ref 3.5–5.2)
ALK PHOS: 85 U/L (ref 39–117)
ALT: 44 U/L — ABNORMAL HIGH (ref 0–35)
AST: 33 U/L (ref 0–37)
Anion gap: 8 (ref 5–15)
BUN: 15 mg/dL (ref 6–23)
CHLORIDE: 103 mmol/L (ref 96–112)
CO2: 29 mmol/L (ref 19–32)
CREATININE: 0.75 mg/dL (ref 0.50–1.10)
Calcium: 9.4 mg/dL (ref 8.4–10.5)
GFR calc Af Amer: >90 mL/min (ref >90)
GFR calc non Af Amer: >90 mL/min (ref >90)
Glucose, Bld: 139 mg/dL — ABNORMAL HIGH (ref 70–99)
POTASSIUM: 3.8 mmol/L (ref 3.5–5.1)
Sodium: 140 mmol/L (ref 135–145)
TOTAL PROTEIN: 7.2 g/dL (ref 6.0–8.3)
Total Bilirubin: 0.6 mg/dL (ref 0.3–1.2)

## 2014-12-01 LAB — CBC WITH DIFFERENTIAL/PLATELET
BASOS ABS: 0 10*3/uL (ref 0.0–0.1)
BASOS PCT: 0 % (ref 0–1)
Eosinophils Absolute: 0.2 10*3/uL (ref 0.0–0.7)
Eosinophils Relative: 2 % (ref 0–5)
HEMATOCRIT: 46.7 % — AB (ref 36.0–46.0)
Hemoglobin: 15.9 g/dL — ABNORMAL HIGH (ref 12.0–15.0)
Lymphocytes Relative: 47 % — ABNORMAL HIGH (ref 12–46)
Lymphs Abs: 4.4 10*3/uL — ABNORMAL HIGH (ref 0.7–4.0)
MCH: 29.2 pg (ref 26.0–34.0)
MCHC: 34 g/dL (ref 30.0–36.0)
MCV: 85.8 fL (ref 78.0–100.0)
MONO ABS: 0.9 10*3/uL (ref 0.1–1.0)
Monocytes Relative: 10 % (ref 3–12)
NEUTROS ABS: 3.8 10*3/uL (ref 1.7–7.7)
NEUTROS PCT: 41 % — AB (ref 43–77)
PLATELETS: 243 10*3/uL (ref 150–400)
RBC: 5.44 MIL/uL — ABNORMAL HIGH (ref 3.87–5.11)
RDW: 12.7 % (ref 11.5–15.5)
WBC: 9.3 10*3/uL (ref 4.0–10.5)

## 2014-12-01 LAB — URINE MICROSCOPIC-ADD ON

## 2014-12-01 LAB — URINALYSIS, ROUTINE W REFLEX MICROSCOPIC
GLUCOSE, UA: NEGATIVE mg/dL
Hgb urine dipstick: NEGATIVE
Ketones, ur: 15 mg/dL — AB
Nitrite: NEGATIVE
PROTEIN: 30 mg/dL — AB
Specific Gravity, Urine: 1.03 (ref 1.005–1.030)
Urobilinogen, UA: 0.2 mg/dL (ref 0.0–1.0)
pH: 5.5 (ref 5.0–8.0)

## 2014-12-01 LAB — I-STAT TROPONIN, ED: Troponin i, poc: 0 ng/mL (ref 0.00–0.08)

## 2014-12-01 LAB — LIPASE, BLOOD: Lipase: 36 U/L (ref 11–59)

## 2014-12-01 MED ORDER — IOHEXOL 300 MG/ML  SOLN
100.0000 mL | Freq: Once | INTRAMUSCULAR | Status: AC | PRN
  Administered 2014-12-01: 100 mL via INTRAVENOUS

## 2014-12-01 MED ORDER — ONDANSETRON HCL 4 MG/2ML IJ SOLN
4.0000 mg | Freq: Once | INTRAMUSCULAR | Status: AC
  Administered 2014-12-01: 4 mg via INTRAVENOUS
  Filled 2014-12-01: qty 2

## 2014-12-01 MED ORDER — IOHEXOL 300 MG/ML  SOLN
25.0000 mL | INTRAMUSCULAR | Status: AC
  Administered 2014-12-01: 25 mL via ORAL

## 2014-12-01 MED ORDER — SODIUM CHLORIDE 0.9 % IV SOLN
Freq: Once | INTRAVENOUS | Status: AC
  Administered 2014-12-01: 08:00:00 via INTRAVENOUS

## 2014-12-01 MED ORDER — DICYCLOMINE HCL 10 MG PO CAPS
20.0000 mg | ORAL_CAPSULE | Freq: Once | ORAL | Status: AC
  Administered 2014-12-01: 20 mg via ORAL
  Filled 2014-12-01: qty 2

## 2014-12-01 MED ORDER — FENTANYL CITRATE 0.05 MG/ML IJ SOLN
100.0000 ug | Freq: Once | INTRAMUSCULAR | Status: AC
  Administered 2014-12-01: 100 ug via INTRAVENOUS
  Filled 2014-12-01: qty 2

## 2014-12-01 NOTE — ED Provider Notes (Signed)
CSN: [id number]     Arrival date & time 12/01/14  0406 History   First MD Initiated Contact with Patient 12/01/14 [phone]     Chief Complaint  Patient presents with  . Abdominal Pain     (Consider location/radiation/quality/duration/timing/severity/associated sxs/prior Treatment) HPI 54 yo female presents to the ER from home with complaint of upper abdominal pain.  Pt reports she has had some right flank pain earlier in the week which she attributed to possible kidney stone.  Pt was seen by her pcp, for routine workup, normal exam and routine labs.  Since Friday, right flank pain has moved into upper abdomen.  She reports h/o pancreatitis, and reports pain is similar.  Nausea without vomiting, no worsening of pain with eating, h/o IBS but normal stools for her.  No sick contacts, no unusual foods.  H/o lupus, dm, morbid obesity, pancreatitis.  S/p cholecystectomy, hysterectomy. Past Medical History  Diagnosis Date  . Lupus   . Diabetes mellitus without complication   . Hypertension   . Kidney stone   . Depression   . GERD (gastroesophageal reflux disease)   . Restless leg   . IBS (irritable bowel syndrome)   . Graves disease     s/p radioactive therapy  . Pancreatitis    Past Surgical History  Procedure Laterality Date  . Cholecystectomy    . Abdominal hysterectomy    . Colonoscopy  2006    RMR: internal/external hemorrhoids, proctitis  . Colonoscopy N/A 02/06/2013    RMR: anal canal hemorrhoids, inadequate prep. Needs screening again in April 2015   Family History  Problem Relation Age of Onset  . Colon cancer Father     patient was adopted, but she knows birth father. Believes he had colon cancer   History  Substance Use Topics  . Smoking status: Never Smoker   . Smokeless tobacco: Not on file  . Alcohol Use: No   OB History    No data available     Review of Systems  All other systems reviewed and are negative.     Allergies  Review of patient's allergies  indicates no known allergies.  Home Medications   Prior to Admission medications   Medication Sig Start Date End Date Taking? Authorizing Provider  amLODipine (NORVASC) 5 MG tablet Take 5 mg by mouth daily.    Historical Provider, MD  atorvastatin (LIPITOR) 40 MG tablet TAKE 1 TABLET BY MOUTH EVERY DAY 09/04/13   Chipper Herb, MD  atorvastatin (LIPITOR) 40 MG tablet TAKE 1 TABLET (40 MG TOTAL) BY MOUTH DAILY. 03/19/14   Lysbeth Penner, FNP  cephALEXin (KEFLEX) 500 MG capsule Take 1 capsule (500 mg total) by mouth 2 (two) times daily. 05/08/13   Ernst Spell, MD  dexlansoprazole (DEXILANT) 60 MG capsule Take 1 capsule (60 mg total) by mouth 2 (two) times daily. 10/14/14   Orvil Feil, NP  DULoxetine (CYMBALTA) 60 MG capsule Take 60 mg by mouth daily.     Historical Provider, MD  fluticasone (FLONASE) 50 MCG/ACT nasal spray Place 2 sprays into the nose as needed for rhinitis or allergies.    Historical Provider, MD  hydrochlorothiazide (HYDRODIURIL) 25 MG tablet Take 25 mg by mouth daily.    Historical Provider, MD  hydroxychloroquine (PLAQUENIL) 200 MG tablet Take 200 mg by mouth daily.    Historical Provider, MD  ibuprofen (ADVIL,MOTRIN) 600 MG tablet Take 1 tablet (600 mg total) by mouth every 8 (eight) hours as needed for pain. 05/08/13  Ernst Spell, MD  levothyroxine (SYNTHROID, LEVOTHROID) 200 MCG tablet TAKE 1 TABLET BY MOUTH EVERY DAY 05/16/13   Mary-Margaret Hassell Done, FNP  lisinopril (PRINIVIL,ZESTRIL) 40 MG tablet Take 40 mg by mouth daily.    Historical Provider, MD  metFORMIN (GLUCOPHAGE) 500 MG tablet Take 1 tablet (500 mg total) by mouth daily with breakfast. 03/07/13   Chipper Herb, MD  methocarbamol (ROBAXIN) 750 MG tablet Take 750 mg by mouth daily as needed (for muscle cramps or pains).    Historical Provider, MD  oxyCODONE-acetaminophen (PERCOCET) 5-325 MG per tablet Take 2 tablets by mouth every 4 (four) hours as needed for pain. 05/08/13   Ernst Spell, MD  pramipexole  (MIRAPEX) 0.125 MG tablet Take 0.125 mg by mouth daily.    Historical Provider, MD   BP 141/87 mmHg  Pulse 76  Temp(Src) 97.8 F (36.6 C) (Oral)  Resp 13  Ht 5\' 5"  (1.651 m)  Wt 340 lb (154.223 kg)  BMI 56.58 kg/m2  SpO2 95% Physical Exam  Constitutional: She is oriented to person, place, and time. She appears well-developed and well-nourished.  HENT:  Head: Normocephalic and atraumatic.  Nose: Nose normal.  Mouth/Throat: Oropharynx is clear and moist.  Eyes: Conjunctivae and EOM are normal. Pupils are equal, round, and reactive to light.  Neck: Normal range of motion. Neck supple. No JVD present. No tracheal deviation present. No thyromegaly present.  Cardiovascular: Normal rate, regular rhythm, normal heart sounds and intact distal pulses.  Exam reveals no gallop and no friction rub.   No murmur heard. Pulmonary/Chest: Effort normal and breath sounds normal. No stridor. No respiratory distress. She has no wheezes. She has no rales. She exhibits no tenderness.  Abdominal: Soft. Bowel sounds are normal. She exhibits no distension and no mass. There is tenderness (diffuse ttp across upper abdomen). There is no rebound and no guarding.  Musculoskeletal: Normal range of motion. She exhibits no edema or tenderness.  Lymphadenopathy:    She has no cervical adenopathy.  Neurological: She is alert and oriented to person, place, and time. She displays normal reflexes. She exhibits normal muscle tone. Coordination normal.  Skin: Skin is warm and dry. No rash noted. No erythema. No pallor.  Psychiatric: She has a normal mood and affect. Her behavior is normal. Judgment and thought content normal.  Nursing note and vitals reviewed.   ED Course  Procedures (including critical care time) Labs Review Labs Reviewed  CBC WITH DIFFERENTIAL/PLATELET - Abnormal; Notable for the following:    RBC 5.44 (*)    Hemoglobin 15.9 (*)    HCT 46.7 (*)    Neutrophils Relative % 41 (*)    Lymphocytes  Relative 47 (*)    Lymphs Abs 4.4 (*)    All other components within normal limits  COMPREHENSIVE METABOLIC PANEL - Abnormal; Notable for the following:    Glucose, Bld 139 (*)    ALT 44 (*)    All other components within normal limits  LIPASE, BLOOD  URINALYSIS, ROUTINE W REFLEX MICROSCOPIC  I-STAT TROPOININ, ED    Imaging Review No results found.   EKG Interpretation None      MDM   Final diagnoses:  None    54 yo female with ongoing upper abdominal pain.  Awaiting labs, expect will need ct scan.  Care passed to DR [facility] ([place] awaiting Ct scan.  Labs unremarkable, but pain has continued.  Kalman Drape, MD 12/01/14 [phone]

## 2014-12-01 NOTE — Discharge Instructions (Signed)
If you were given medicines take as directed.  If you are on coumadin or contraceptives realize their levels and effectiveness is altered by many different medicines.  If you have any reaction (rash, tongues swelling, other) to the medicines stop taking and see a physician.   Please follow up as directed and return to the ER or see a physician for new or worsening symptoms.  Thank you. Filed Vitals:   12/01/14 0800 12/01/14 0800 12/01/14 0815 12/01/14 0830  BP: 136/64 136/64 130/58 146/77  Pulse: 78 79 71 72  Temp:      TempSrc:      Resp: 19 14 15 18   Height:      Weight:      SpO2: 96% 97% 88% 95%

## 2014-12-01 NOTE — ED Provider Notes (Signed)
Patient's care signed out to me to follow-up CT scan for final disposition. CT scan results reviewed no acute findings. Discussed outpatient follow-up for adnexal cyst. Patient comfortable with this plan. Ct Abdomen Pelvis W Contrast  12/01/2014   CLINICAL DATA:  Upper mid abdominal pain of 1 week duration.  EXAM: CT ABDOMEN AND PELVIS WITH CONTRAST  TECHNIQUE: Multidetector CT imaging of the abdomen and pelvis was performed using the standard protocol following bolus administration of intravenous contrast.  CONTRAST:  163mL OMNIPAQUE IOHEXOL 300 MG/ML  SOLN  COMPARISON:  03/13/2014, 05/08/2013, 04/08/2009  FINDINGS: Lower chest:  No significant abnormalities.  Hepatobiliary: Cholecystectomy. Mild fatty infiltration of the liver without focal lesion. No bile duct dilatation.  Pancreas: Normal  Spleen: Normal  Adrenals/Urinary Tract: There is a 5 x 7 mm lower pole right renal calculus. This is mildly enlarged from 05/08/13. There is no hydronephrosis or ureteral dilatation. No renal parenchymal lesions are evident. Adrenals are normal in appearance.  Stomach/Bowel: The stomach, small bowel and colon are unremarkable in appearance. The appendix is normal.  Vascular/Lymphatic: The abdominal aorta is normal in caliber. Minimal atherosclerotic calcifications are present.  Reproductive: There is prior partial hysterectomy or myomectomy. I believe both ovaries are present. The right ovary is normal in appearance. There is a multilocular left adnexal cyst measuring approximately 2.3 x 3.9 by 2.6 cm. This is slowly enlarging, previously measuring 1.6 x 3.7 by 2.0 cm on 05/08/2013.  Musculoskeletal: There is mild benign compression of T9 and T11, old. No significant musculoskeletal abnormalities are evident.  IMPRESSION: 1. No acute findings are evident in the abdomen or pelvis 2. Right lower pole renal calculus, slightly enlarged compared to older studies. 3. Multilocular left adnexal cyst. Recommend pelvic ultrasound for  characterization.   Electronically Signed   By: Andreas Newport M.D.   On: 12/01/2014 08:24    Abdominal pain  Mariea Clonts, MD 12/01/14 [phone]

## 2014-12-01 NOTE — ED Notes (Signed)
Pt arrives from home with c/o upper mid abdominal pain, states it started a week ago in her lower back and moved up to her epigastric region. 7/10, states it feels like a really bad gas pains

## 2015-04-29 ENCOUNTER — Encounter: Payer: Self-pay | Admitting: *Deleted

## 2015-10-15 ENCOUNTER — Telehealth: Payer: Self-pay | Admitting: Family Medicine

## 2015-11-04 ENCOUNTER — Telehealth: Payer: Self-pay | Admitting: Gastroenterology

## 2015-11-04 ENCOUNTER — Encounter: Payer: Self-pay | Admitting: Internal Medicine

## 2015-11-04 ENCOUNTER — Other Ambulatory Visit: Payer: Self-pay

## 2015-11-04 MED ORDER — DEXLANSOPRAZOLE 60 MG PO CPDR: 60.0000 mg | DELAYED_RELEASE_CAPSULE | Freq: Every day | ORAL | Status: DC

## 2015-11-04 NOTE — Telephone Encounter (Signed)
APPOINTMENT MADE AND LETTER SENT °

## 2015-11-04 NOTE — Telephone Encounter (Signed)
Refill request for Dexilant addressed with #30 and 1 refill. However, we have not seen in over 2 years. NO further refills and needs to have appt with [place] for routine follow-up.

## 2015-12-02 ENCOUNTER — Telehealth: Payer: Self-pay | Admitting: Gastroenterology

## 2015-12-02 ENCOUNTER — Ambulatory Visit (INDEPENDENT_AMBULATORY_CARE_PROVIDER_SITE_OTHER): Payer: Medicare Other | Admitting: Gastroenterology

## 2015-12-02 ENCOUNTER — Encounter: Payer: Self-pay | Admitting: Gastroenterology

## 2015-12-02 ENCOUNTER — Telehealth: Payer: Self-pay | Admitting: Internal Medicine

## 2015-12-02 ENCOUNTER — Other Ambulatory Visit: Payer: Self-pay

## 2015-12-02 VITALS — BP 136/75 | HR 96 | Temp 97.6°F | Ht 66.0 in | Wt 340.4 lb

## 2015-12-02 DIAGNOSIS — K589 Irritable bowel syndrome without diarrhea: Secondary | ICD-10-CM

## 2015-12-02 DIAGNOSIS — K760 Fatty (change of) liver, not elsewhere classified: Secondary | ICD-10-CM | POA: Diagnosis not present

## 2015-12-02 DIAGNOSIS — Z800 Family history of malignant neoplasm of digestive organs: Secondary | ICD-10-CM | POA: Diagnosis not present

## 2015-12-02 DIAGNOSIS — N949 Unspecified condition associated with female genital organs and menstrual cycle: Secondary | ICD-10-CM

## 2015-12-02 MED ORDER — DEXLANSOPRAZOLE 60 MG PO CPDR: 60.0000 mg | DELAYED_RELEASE_CAPSULE | Freq: Every day | ORAL | Status: DC

## 2015-12-02 MED ORDER — PEG 3350-KCL-NA BICARB-NACL 420 G PO SOLR: 4000.0000 mL | Freq: Once | ORAL | Status: DC

## 2015-12-02 MED ORDER — HYDROCORTISONE 2.5 % RE CREA: 1.0000 "application " | TOPICAL_CREAM | Freq: Two times a day (BID) | RECTAL | Status: DC

## 2015-12-02 MED ORDER — DICYCLOMINE HCL 10 MG PO CAPS: 10.0000 mg | ORAL_CAPSULE | Freq: Three times a day (TID) | ORAL | Status: DC

## 2015-12-02 NOTE — Progress Notes (Signed)
cc'ed to pcp °

## 2015-12-02 NOTE — Telephone Encounter (Signed)
Called pt is aware

## 2015-12-02 NOTE — Progress Notes (Signed)
Referring Provider: Chipper Herb, MD Primary Care Physician:  Gearlean Alf., PA-C  Primary GI: Dr. Gala Romney   Chief Complaint  Patient presents with  . Follow-up  . Medication Refill    HPI:   [name] is a 55 y.o. female presenting today with a history of GERD, IBS-D. Last colonoscopy in April 2014 with a poor prep and incomplete. Last complete colonoscopy 2006 with internal/external hemorrhoids and proctitis. She was due for early interval colonoscopy 2015 with an extended prep due to last inadequate prep but did not complete this. Presents now to discuss.   Dexilant once daily. No solid food dysphagia. A few times was swallowing saliva and had to swallow "hard", "nothing major". Has to immediately go to bathroom after eating. Loose stool. Chronic issue. Sometimes will break out in a sweat. Has intermittent low-volume hematochezia. Poor appetite. Doesn't eat breakfast or lunch but eats a big meal at dinner. No N/V. Weight gain noted after review of records.   She has vague lower abdominal discomfort, unable to clearly define. Last imaging on file from Jan 2016 noting multilocular left adnexal cyst, recommending pelvic ultrasound for characterization. She has not had further work-up for this.   Past Medical History  Diagnosis Date  . Lupus ([place])   . Diabetes mellitus without complication ([place])   . Hypertension   . Kidney stone   . Depression   . GERD (gastroesophageal reflux disease)   . Restless leg   . IBS (irritable bowel syndrome)   . Graves disease     s/p radioactive therapy  . Pancreatitis     Past Surgical History  Procedure Laterality Date  . Cholecystectomy    . Abdominal hysterectomy    . Colonoscopy  2006    RMR: internal/external hemorrhoids, proctitis  . Colonoscopy N/A 02/06/2013    RMR: anal canal hemorrhoids, inadequate prep. Needs screening again in April 2015    Current Outpatient Prescriptions  Medication Sig Dispense Refill  . amLODipine  (NORVASC) 5 MG tablet Take 5 mg by mouth daily.    Marland Kitchen aspirin EC 81 MG tablet Take 81 mg by mouth daily.    Marland Kitchen atorvastatin (LIPITOR) 40 MG tablet TAKE 1 TABLET (40 MG TOTAL) BY MOUTH DAILY. 30 tablet 1  . dexlansoprazole (DEXILANT) 60 MG capsule Take 1 capsule (60 mg total) by mouth daily. 30 capsule 1  . DULoxetine (CYMBALTA) 60 MG capsule Take 60 mg by mouth daily.     . fluticasone (FLONASE) 50 MCG/ACT nasal spray Place 2 sprays into the nose as needed for rhinitis or allergies.    . hydrochlorothiazide (HYDRODIURIL) 25 MG tablet Take 25 mg by mouth daily.    . hydroxychloroquine (PLAQUENIL) 200 MG tablet Take 200 mg by mouth daily.    Marland Kitchen ibuprofen (ADVIL,MOTRIN) 600 MG tablet Take 1 tablet (600 mg total) by mouth every 8 (eight) hours as needed for pain. 30 tablet 0  . levothyroxine (SYNTHROID, LEVOTHROID) 200 MCG tablet TAKE 1 TABLET BY MOUTH EVERY DAY (Patient taking differently: TAKE 1 TABLET BY MOUTH EVERY DAY ALONG WITH ONE 25MCG.) 30 tablet 4  . levothyroxine (SYNTHROID, LEVOTHROID) 25 MCG tablet Take 25 mcg by mouth daily before breakfast. Takes along with the 27mcg to equal 281mcg    . lisinopril (PRINIVIL,ZESTRIL) 40 MG tablet Take 40 mg by mouth daily.    Marland Kitchen loratadine (CLARITIN) 10 MG tablet Take 10 mg by mouth daily.    . metFORMIN (GLUCOPHAGE) 500 MG tablet Take 1  tablet (500 mg total) by mouth daily with breakfast. 30 tablet 1  . methocarbamol (ROBAXIN) 750 MG tablet Take 750 mg by mouth daily as needed (for muscle cramps or pains).    . pramipexole (MIRAPEX) 0.125 MG tablet Take 0.125 mg by mouth daily as needed (restles legs).     . vitamin D, CHOLECALCIFEROL, 400 UNITS tablet Take 400 Units by mouth daily.    Marland Kitchen atorvastatin (LIPITOR) 40 MG tablet TAKE 1 TABLET BY MOUTH EVERY DAY (Patient not taking: Reported on 12/02/2015) 30 tablet 0  . cephALEXin (KEFLEX) 500 MG capsule Take 1 capsule (500 mg total) by mouth 2 (two) times daily. (Patient not taking: Reported on 12/01/2014) 14  capsule 0  . oxyCODONE-acetaminophen (PERCOCET) 5-325 MG per tablet Take 2 tablets by mouth every 4 (four) hours as needed for pain. (Patient not taking: Reported on 12/01/2014) 20 tablet 0   No current facility-administered medications for this visit.    Allergies as of 12/02/2015  . (No Known Allergies)    Family History  Problem Relation Age of Onset  . Colon cancer Father     patient was adopted, but she knows birth father. Believes he had colon cancer    Social History   Social History  . Marital Status: Divorced    Spouse Name: N/A  . Number of Children: N/A  . Years of Education: N/A   Social History Main Topics  . Smoking status: Never Smoker   . Smokeless tobacco: None  . Alcohol Use: No  . Drug Use: No  . Sexual Activity: Not Asked   Other Topics Concern  . None   Social History Narrative    Review of Systems: As mentioned in HPI.   Physical Exam: BP 136/75 mmHg  Pulse 96  Temp(Src) 97.6 F (36.4 C) (Oral)  Ht 5\' 6"  (1.676 m)  Wt 340 lb 6.4 oz (154.404 kg)  BMI 54.97 kg/m2 General:   Alert and oriented. No distress noted. Pleasant and cooperative.  Head:  Normocephalic and atraumatic. Eyes:  Conjuctiva clear without scleral icterus. Mouth:  Oral mucosa pink and moist. Good dentition. No lesions. Heart:  S1, S2 present without murmurs, rubs, or gallops. Regular rate and rhythm. Abdomen:  +BS, soft, non-tender and non-distended. No rebound or guarding. Obese  Msk:  Symmetrical without gross deformities. Normal posture. Extremities:  1-2+ lower extremity edema Neurologic:  Alert and  oriented x4;  grossly normal neurologically. Psych:  Alert and cooperative. Normal mood and affect.  Lab Results  Component Value Date   ALT 44* 12/01/2014   AST 33 12/01/2014   ALKPHOS 85 12/01/2014   BILITOT 0.6 12/01/2014

## 2015-12-02 NOTE — Telephone Encounter (Signed)
PATIENT CALLED AND SAID THAT HER ALT WAS 87

## 2015-12-02 NOTE — Telephone Encounter (Signed)
Routing to AS 

## 2015-12-02 NOTE — Telephone Encounter (Signed)
Thanks! I will be getting blood work from her PCP.

## 2015-12-02 NOTE — Assessment & Plan Note (Addendum)
History of diffuse fatty liver. Obtain outside records. Appears ALT has been very mildly elevated historically. Will obtain elastography for baseline. If evidence of F3/F4 Metavir score, recommend EGD at time of colonoscopy.   As of note, vague lower abdominal pain reported chronically. Difficult to characterize. Last CT in Jan 2016 with left adnexal cyst, recommending pelvic ultrasound. This has never been completed. Will order now and refer to GYN as appropriate.

## 2015-12-02 NOTE — Assessment & Plan Note (Signed)
55 year old female due for screening colonoscopy, poor prep historically. Birth father believed to have had colon cancer. Last complete colonoscopy in 2006, with inadequate prep in 2014. Low-volume hematochezia likely secondary to benign anorectal source.   Proceed with TCS with Dr. Gala Romney in near future: the risks, benefits, and alternatives have been discussed with the patient in detail. The patient states understanding and desires to proceed. PROPOFOL due to polypharmacy Extra full day of liquids in addition to standard preparation Anusol cream to pharmacy

## 2015-12-02 NOTE — Assessment & Plan Note (Signed)
Chronic history of IBS-D. Not a Viberzi candidate due to history of pancreatitis in the past per patient report. Unable to exclude metformin playing a role. Start Bentyl 10 mg QID.

## 2015-12-02 NOTE — Progress Notes (Signed)
Received outside labs dated Dec 01, 2015. AST 51, ALT 87, Alk Phos 88, Tbili 1.0, albumin 4.7.   Will wait on results of elastography for further recommendations. May need further work-up depending on results.

## 2015-12-02 NOTE — Patient Instructions (Addendum)
I have refilled your Dexilant for a year. I have sent in a medication called Bentyl to take before meals and at bedtime. This is to help with cramping and loose stool. I have sent in Anusol cream to your pharmacy for your hemorrhoids. Use this twice a day, 5-7 days at the most.   We have scheduled you for a colonoscopy with Dr. Gala Romney. You may need an upper endoscopy if your liver ultrasound shows any changes of early cirrhosis.   I have also ordered a pelvic ultrasound to further evaluate the cysts noted on prior CTs.

## 2015-12-02 NOTE — Telephone Encounter (Signed)
I forgot to add at time of appt:   Needs extra day of clear liquids in addition to our normal recommendation of clear liquids prior to colonoscopy (so this would be 2 full days? ) History of failed prep in the past.

## 2015-12-08 ENCOUNTER — Ambulatory Visit: Payer: Medicare Other | Admitting: Gastroenterology

## 2015-12-21 NOTE — Patient Instructions (Addendum)
[name]  12/21/2015     @PREFPERIOPPHARMACY @   Your procedure is scheduled on  12/24/2015   Report to [facility] at  830  A.M.  Call this number if you have problems the morning of surgery:  [phone]   Remember:  Do not eat food or drink liquids after midnight.  Take these medicines the morning of surgery with A SIP OF WATER amlodipine, dexilant, cymbalta, paquenil, levothyroxine, lisinopril, claritin, robaxin, deltasone.   Do not wear jewelry, make-up or nail polish.  Do not wear lotions, powders, or perfumes.  You may wear deodorant.  Do not shave 48 hours prior to surgery.  Men may shave face and neck.  Do not bring valuables to the hospital.  [facility] is not responsible for any belongings or valuables.  Contacts, dentures or bridgework may not be worn into surgery.  Leave your suitcase in the car.  After surgery it may be brought to your room.  For patients admitted to the hospital, discharge time will be determined by your treatment team.  Patients discharged the day of surgery will not be allowed to drive home.   Name and phone number of your driver:   family Special instructions:  Follow the diet and prep instructions given to you by Dr Roseanne Kaufman office.  Please read over the following fact sheets that you were given. Coughing and Deep Breathing, Surgical Site Infection Prevention, Anesthesia Post-op Instructions and Care and Recovery After Surgery      Esophagogastroduodenoscopy Esophagogastroduodenoscopy (EGD) is a procedure that is used to examine the lining of the esophagus, stomach, and first part of the small intestine (duodenum). A long, flexible, lighted tube with a camera attached (endoscope) is inserted down the throat to view these organs. This procedure is done to detect problems or abnormalities, such as inflammation, bleeding, ulcers, or growths, in order to treat them. The procedure lasts 5-20 minutes. It is usually an  outpatient procedure, but it may need to be performed in a hospital in emergency cases. LET [facility] CARE PROVIDER KNOW ABOUT:  Any allergies you have.  All medicines you are taking, including vitamins, herbs, eye drops, creams, and over-the-counter medicines.  Previous problems you or members of your family have had with the use of anesthetics.  Any blood disorders you have.  Previous surgeries you have had.  Medical conditions you have. RISKS AND COMPLICATIONS Generally, this is a safe procedure. However, problems can occur and include:  Infection.  Bleeding.  Tearing (perforation) of the esophagus, stomach, or duodenum.  Difficulty breathing or not being able to breathe.  Excessive sweating.  Spasms of the larynx.  Slowed heartbeat.  Low blood pressure. BEFORE THE PROCEDURE  Do not eat or drink anything after midnight on the night before the procedure or as directed by your health care provider.  Do not take your regular medicines before the procedure if your health care provider asks you not to. Ask your health care provider about changing or stopping those medicines.  If you wear dentures, be prepared to remove them before the procedure.  Arrange for someone to drive you home after the procedure. PROCEDURE  A numbing medicine (local anesthetic) may be sprayed in your throat for comfort and to stop you from gagging or coughing.  You will have an IV tube inserted in a vein in your hand or arm. You will receive medicines and fluids through this tube.  You will be given a medicine to relax you (sedative).  A pain reliever will be given through the IV tube.  A mouth guard may be placed in your mouth to protect your teeth and to keep you from biting on the endoscope.  You will be asked to lie on your left side.  The endoscope will be inserted down your throat and into your esophagus, stomach, and duodenum.  Air will be put through the endoscope to allow your  health care provider to clearly view the lining of your esophagus.  The lining of your esophagus, stomach, and duodenum will be examined. During the exam, your health care provider may:  Remove tissue to be examined under a microscope (biopsy) for inflammation, infection, or other medical problems.  Remove growths.  Remove objects (foreign bodies) that are stuck.  Treat any bleeding with medicines or other devices that stop tissues from bleeding (hot cautery, clipping devices).  Widen (dilate) or stretch narrowed areas of your esophagus and stomach.  The endoscope will be withdrawn. AFTER THE PROCEDURE  You will be taken to a recovery area for observation. Your blood pressure, heart rate, breathing rate, and blood oxygen level will be monitored often until the medicines you were given have worn off.  Do not eat or drink anything until the numbing medicine has worn off and your gag reflex has returned. You may choke.  Your health care provider should be able to discuss his or her findings with you. It will take longer to discuss the test results if any biopsies were taken.   This information is not intended to replace advice given to you by your health care provider. Make sure you discuss any questions you have with your health care provider.   Document Released: 02/24/2005 Document Revised: 11/14/2014 Document Reviewed: 09/26/2012 Elsevier Interactive Patient Education 2016 [employer]. Esophagogastroduodenoscopy, Care After Refer to this sheet in the next few weeks. These instructions provide you with information about caring for yourself after your procedure. Your health care provider may also give you more specific instructions. Your treatment has been planned according to current medical practices, but problems sometimes occur. Call your health care provider if you have any problems or questions after your procedure. WHAT TO EXPECT AFTER THE PROCEDURE After your procedure, it is  typical to feel:  Soreness in your throat.  Pain with swallowing.  Sick to your stomach (nauseous).  Bloated.  Dizzy.  Fatigued. HOME CARE INSTRUCTIONS  Do not eat or drink anything until the numbing medicine (local anesthetic) has worn off and your gag reflex has returned. You will know that the local anesthetic has worn off when you can swallow comfortably.  Do not drive or operate machinery until directed by your health care provider.  Take medicines only as directed by your health care provider. SEEK MEDICAL CARE IF:   You cannot stop coughing.  You are not urinating at all or less than usual. SEEK IMMEDIATE MEDICAL CARE IF:  You have difficulty swallowing.  You cannot eat or drink.  You have worsening throat or chest pain.  You have dizziness or lightheadedness or you faint.  You have nausea or vomiting.  You have chills.  You have a fever.  You have severe abdominal pain.  You have black, tarry, or bloody stools.   This information is not intended to replace advice given to you by your health care provider. Make sure you discuss any questions you have with your health care provider.  Document Released: 10/10/2012 Document Revised: 11/14/2014 Document Reviewed: 10/10/2012 Elsevier Interactive Patient Education 2016 [employer]. Colonoscopy A colonoscopy is an exam to look at the entire large intestine (colon). This exam can help find problems such as tumors, polyps, inflammation, and areas of bleeding. The exam takes about 1 hour.  LET [place] CARE PROVIDER KNOW ABOUT:   Any allergies you have.  All medicines you are taking, including vitamins, herbs, eye drops, creams, and over-the-counter medicines.  Previous problems you or members of your family have had with the use of anesthetics.  Any blood disorders you have.  Previous surgeries you have had.  Medical conditions you have. RISKS AND COMPLICATIONS  Generally, this is a safe procedure.  However, as with any procedure, complications can occur. Possible complications include:  Bleeding.  Tearing or rupture of the colon wall.  Reaction to medicines given during the exam.  Infection (rare). BEFORE THE PROCEDURE   Ask your health care provider about changing or stopping your regular medicines.  You may be prescribed an oral bowel prep. This involves drinking a large amount of medicated liquid, starting the day before your procedure. The liquid will cause you to have multiple loose stools until your stool is almost clear or light green. This cleans out your colon in preparation for the procedure.  Do not eat or drink anything else once you have started the bowel prep, unless your health care provider tells you it is safe to do so.  Arrange for someone to drive you home after the procedure. PROCEDURE   You will be given medicine to help you relax (sedative).  You will lie on your side with your knees bent.  A long, flexible tube with a light and camera on the end (colonoscope) will be inserted through the rectum and into the colon. The camera sends video back to a computer screen as it moves through the colon. The colonoscope also releases carbon dioxide gas to inflate the colon. This helps your health care provider see the area better.  During the exam, your health care provider may take a small tissue sample (biopsy) to be examined under a microscope if any abnormalities are found.  The exam is finished when the entire colon has been viewed. AFTER THE PROCEDURE   Do not drive for 24 hours after the exam.  You may have a small amount of blood in your stool.  You may pass moderate amounts of gas and have mild abdominal cramping or bloating. This is caused by the gas used to inflate your colon during the exam.  Ask when your test results will be ready and how you will get your results. Make sure you get your test results.   This information is not intended to replace  advice given to you by your health care provider. Make sure you discuss any questions you have with your health care provider.   Document Released: 10/21/2000 Document Revised: 08/14/2013 Document Reviewed: 07/01/2013 Elsevier Interactive Patient Education 2016 [employer] Colonoscopy, Care After Refer to this sheet in the next few weeks. These instructions provide you with information on caring for yourself after your procedure. Your health care provider may also give you more specific instructions. Your treatment has been planned according to current medical practices, but problems sometimes occur. Call your health care provider if you have any problems or questions after your procedure. WHAT TO EXPECT AFTER THE PROCEDURE  After your procedure, it is typical to have the following:  A small  amount of blood in your stool.  Moderate amounts of gas and mild abdominal cramping or bloating. HOME CARE INSTRUCTIONS  Do not drive, operate machinery, or sign important documents for 24 hours.  You may shower and resume your regular physical activities, but move at a slower pace for the first 24 hours.  Take frequent rest periods for the first 24 hours.  Walk around or put a warm pack on your abdomen to help reduce abdominal cramping and bloating.  Drink enough fluids to keep your urine clear or pale yellow.  You may resume your normal diet as instructed by your health care provider. Avoid heavy or fried foods that are hard to digest.  Avoid drinking alcohol for 24 hours or as instructed by your health care provider.  Only take over-the-counter or prescription medicines as directed by your health care provider.  If a tissue sample (biopsy) was taken during your procedure:  Do not take aspirin or blood thinners for 7 days, or as instructed by your health care provider.  Do not drink alcohol for 7 days, or as instructed by your health care provider.  Eat soft foods for the first 24  hours. SEEK MEDICAL CARE IF: You have persistent spotting of blood in your stool 2-3 days after the procedure. SEEK IMMEDIATE MEDICAL CARE IF:  You have more than a small spotting of blood in your stool.  You pass large blood clots in your stool.  Your abdomen is swollen (distended).  You have nausea or vomiting.  You have a fever.  You have increasing abdominal pain that is not relieved with medicine.   This information is not intended to replace advice given to you by your health care provider. Make sure you discuss any questions you have with your health care provider.   Document Released: 06/07/2004 Document Revised: 08/14/2013 Document Reviewed: 07/01/2013 Elsevier Interactive Patient Education 2016 [employer] PATIENT INSTRUCTIONS POST-ANESTHESIA  IMMEDIATELY FOLLOWING SURGERY:  Do not drive or operate machinery for the first twenty four hours after surgery.  Do not make any important decisions for twenty four hours after surgery or while taking narcotic pain medications or sedatives.  If you develop intractable nausea and vomiting or a severe headache please notify your doctor immediately.  FOLLOW-UP:  Please make an appointment with your surgeon as instructed. You do not need to follow up with anesthesia unless specifically instructed to do so.  WOUND CARE INSTRUCTIONS (if applicable):  Keep a dry clean dressing on the anesthesia/puncture wound site if there is drainage.  Once the wound has quit draining you may leave it open to air.  Generally you should leave the bandage intact for twenty four hours unless there is drainage.  If the epidural site drains for more than 36-48 hours please call the anesthesia department.  QUESTIONS?:  Please feel free to call your physician or the hospital operator if you have any questions, and they will be happy to assist you.                    [name]  12/21/2015     @PREFPERIOPPHARMACY @   Your procedure is  scheduled on  12/24/2015   Report to [facility] at  830  A.M.  Call this number if you have problems the morning of surgery:  [phone]   Remember:  Do not eat food or drink liquids after midnight.  Take these medicines the morning of surgery with A SIP OF WATER  Amlodipine, dexilant,  cymbalta, plaquenil, levothyroxine, lisinopril, clartin, robaxin, deltasone.   Do not wear jewelry, make-up or nail polish.  Do not wear lotions, powders, or perfumes.  You may wear deodorant.  Do not shave 48 hours prior to surgery.  Men may shave face and neck.  Do not bring valuables to the hospital.  [facility] is not responsible for any belongings or valuables.  Contacts, dentures or bridgework may not be worn into surgery.  Leave your suitcase in the car.  After surgery it may be brought to your room.  For patients admitted to the hospital, discharge time will be determined by your treatment team.  Patients discharged the day of surgery will not be allowed to drive home.   Name and phone number of your driver:   family Special instructions:  Follow the diet and prep instructions given to you by Dr Roseanne Kaufman office.  Please read over the following fact sheets that you were given. Coughing and Deep Breathing, Surgical Site Infection Prevention, Anesthesia Post-op Instructions and Care and Recovery After Surgery      Esophagogastrectomy Esophagogastrectomy is a surgery to remove a diseased portion of your esophagus. The upper portion of your stomach and some lymph nodes in the area are removed at the same time. In most cases, your stomach is then attached directly to the remaining portion of the esophagus. Esophagogastrectomy is often done as a treatment for cancer or precancerous conditions. The surgery may also be done to treat severe injuries to the esophagus. LET YOUR CAREGIVER KNOW ABOUT:   Allergies to food or medicine.  Medicines taken, including vitamins, herbs, eyedrops, over-the-counter  medicines, and creams.  Use of steroids (by mouth or creams).  Previous problems with anesthetics or numbing medicines.  History of bleeding problems or blood clots.  Previous surgery.  Other health problems, including diabetes and kidney problems.  Possibility of pregnancy, if this applies. RISKS AND COMPLICATIONS   Allergies to medicines used during the procedure.  Problems with breathing.  Bleeding.  Infection.  Damage to other structures near the esophagus and stomach.  Problems with swallowing. BEFORE THE PROCEDURE   Stop smoking if you smoke. Stopping will improve your health after surgery.  You may have blood tests to make sure your blood clots normally. Ask your caregiver about changing or stopping your regular medicines. You may be asked to stop taking blood thinners (anticoagulants) or nonsteroidal anti-inflammatory drugs (NSAIDs).  Do not eat or drink anything at least 8 hours before the surgery.  You and your caregiver will talk about the different surgical approaches. Together, you will agree on the surgical approach that is right for you. PROCEDURE   An intravenous (IV) access tube is put into one of your veins to give you fluids and medicines.  You will receive medicines to relax you (sedatives) and medicines that make you sleep (general anesthetic).  You may have a flexible tube (catheter) put into your bladder to drain urine.  You may have a tube put through your nose or mouth into your stomach (NG tube) to remove digestive juices and prevent you from vomiting and feeling nauseous.  Surgical cuts (incisions) may be made in the throat, chest, or abdomen. Multiple smaller incisions may be made if the surgery can be done using a scope.  The part of the stomach to be removed will be stapled off and taken out.  The diseased length of esophagus will be removed, as well as lymph nodes in the area.  The remaining portion  of the stomach will be attached to  the remaining portion of the esophagus.  All incisions will be closed with stitches (sutures), staples, or surgical glue. AFTER THE PROCEDURE   You will wake up in a recovery room, resting in bed until you have fully returned to consciousness.  You will have some pain. Pain medicines will be available to help you.  Your temperature, breathing rate, heart rate, blood pressure, and oxygen level (vital signs) will be monitored regularly.  You may be admitted to an intensive care unit (ICU) in the hospital for 1-2 days. There, you can be closely monitored.  The head of your bed will be kept at an upright angle.  You will likely have an NG tube that goes through your nose and into your stomach.  You may have a feeding tube. This tube will give you the proper nutrition until you can take food by mouth again.  You will likely have multiple tubes that are draining fluid from the incision.  You will have a catheter in your bladder.  You will continue to receive IV fluids.  You may have compression stockings on your legs to prevent blood clots.  You will be taught some breathing exercises. These will help your lungs recover from the anesthesia.  When you are more stable, you will be transferred to a regular hospital room.  You will probably be in the hospital for about 10-14 days. During this time, various drains, tubes, and monitors will slowly be discontinued when they are no longer needed. You will also be slowly eased into doing more activity and drinking and eating by mouth again.   This information is not intended to replace advice given to you by your health care provider. Make sure you discuss any questions you have with your health care provider.   Document Released: 04/24/2012 Document Reviewed: 04/24/2012 Elsevier Interactive Patient Education 2016 [employer]. Esophagogastroduodenoscopy, Care After Refer to this sheet in the next few weeks. These instructions provide you  with information about caring for yourself after your procedure. Your health care provider may also give you more specific instructions. Your treatment has been planned according to current medical practices, but problems sometimes occur. Call your health care provider if you have any problems or questions after your procedure. WHAT TO EXPECT AFTER THE PROCEDURE After your procedure, it is typical to feel:  Soreness in your throat.  Pain with swallowing.  Sick to your stomach (nauseous).  Bloated.  Dizzy.  Fatigued. HOME CARE INSTRUCTIONS  Do not eat or drink anything until the numbing medicine (local anesthetic) has worn off and your gag reflex has returned. You will know that the local anesthetic has worn off when you can swallow comfortably.  Do not drive or operate machinery until directed by your health care provider.  Take medicines only as directed by your health care provider. SEEK MEDICAL CARE IF:   You cannot stop coughing.  You are not urinating at all or less than usual. SEEK IMMEDIATE MEDICAL CARE IF:  You have difficulty swallowing.  You cannot eat or drink.  You have worsening throat or chest pain.  You have dizziness or lightheadedness or you faint.  You have nausea or vomiting.  You have chills.  You have a fever.  You have severe abdominal pain.  You have black, tarry, or bloody stools.   This information is not intended to replace advice given to you by your health care provider. Make sure you discuss any questions  you have with your health care provider.   Document Released: 10/10/2012 Document Revised: 11/14/2014 Document Reviewed: 10/10/2012 Elsevier Interactive Patient Education 2016 [employer]. Colonoscopy A colonoscopy is an exam to look at the entire large intestine (colon). This exam can help find problems such as tumors, polyps, inflammation, and areas of bleeding. The exam takes about 1 hour.  LET [facility] CARE PROVIDER KNOW  ABOUT:   Any allergies you have.  All medicines you are taking, including vitamins, herbs, eye drops, creams, and over-the-counter medicines.  Previous problems you or members of your family have had with the use of anesthetics.  Any blood disorders you have.  Previous surgeries you have had.  Medical conditions you have. RISKS AND COMPLICATIONS  Generally, this is a safe procedure. However, as with any procedure, complications can occur. Possible complications include:  Bleeding.  Tearing or rupture of the colon wall.  Reaction to medicines given during the exam.  Infection (rare). BEFORE THE PROCEDURE   Ask your health care provider about changing or stopping your regular medicines.  You may be prescribed an oral bowel prep. This involves drinking a large amount of medicated liquid, starting the day before your procedure. The liquid will cause you to have multiple loose stools until your stool is almost clear or light green. This cleans out your colon in preparation for the procedure.  Do not eat or drink anything else once you have started the bowel prep, unless your health care provider tells you it is safe to do so.  Arrange for someone to drive you home after the procedure. PROCEDURE   You will be given medicine to help you relax (sedative).  You will lie on your side with your knees bent.  A long, flexible tube with a light and camera on the end (colonoscope) will be inserted through the rectum and into the colon. The camera sends video back to a computer screen as it moves through the colon. The colonoscope also releases carbon dioxide gas to inflate the colon. This helps your health care provider see the area better.  During the exam, your health care provider may take a small tissue sample (biopsy) to be examined under a microscope if any abnormalities are found.  The exam is finished when the entire colon has been viewed. AFTER THE PROCEDURE   Do not drive for  24 hours after the exam.  You may have a small amount of blood in your stool.  You may pass moderate amounts of gas and have mild abdominal cramping or bloating. This is caused by the gas used to inflate your colon during the exam.  Ask when your test results will be ready and how you will get your results. Make sure you get your test results.   This information is not intended to replace advice given to you by your health care provider. Make sure you discuss any questions you have with your health care provider.   Document Released: 10/21/2000 Document Revised: 08/14/2013 Document Reviewed: 07/01/2013 Elsevier Interactive Patient Education 2016 [employer] Colonoscopy, Care After Refer to this sheet in the next few weeks. These instructions provide you with information on caring for yourself after your procedure. Your health care provider may also give you more specific instructions. Your treatment has been planned according to current medical practices, but problems sometimes occur. Call your health care provider if you have any problems or questions after your procedure. WHAT TO EXPECT AFTER THE PROCEDURE  After your procedure, it  is typical to have the following:  A small amount of blood in your stool.  Moderate amounts of gas and mild abdominal cramping or bloating. HOME CARE INSTRUCTIONS  Do not drive, operate machinery, or sign important documents for 24 hours.  You may shower and resume your regular physical activities, but move at a slower pace for the first 24 hours.  Take frequent rest periods for the first 24 hours.  Walk around or put a warm pack on your abdomen to help reduce abdominal cramping and bloating.  Drink enough fluids to keep your urine clear or pale yellow.  You may resume your normal diet as instructed by your health care provider. Avoid heavy or fried foods that are hard to digest.  Avoid drinking alcohol for 24 hours or as instructed by your health  care provider.  Only take over-the-counter or prescription medicines as directed by your health care provider.  If a tissue sample (biopsy) was taken during your procedure:  Do not take aspirin or blood thinners for 7 days, or as instructed by your health care provider.  Do not drink alcohol for 7 days, or as instructed by your health care provider.  Eat soft foods for the first 24 hours. SEEK MEDICAL CARE IF: You have persistent spotting of blood in your stool 2-3 days after the procedure. SEEK IMMEDIATE MEDICAL CARE IF:  You have more than a small spotting of blood in your stool.  You pass large blood clots in your stool.  Your abdomen is swollen (distended).  You have nausea or vomiting.  You have a fever.  You have increasing abdominal pain that is not relieved with medicine.   This information is not intended to replace advice given to you by your health care provider. Make sure you discuss any questions you have with your health care provider.   Document Released: 06/07/2004 Document Revised: 08/14/2013 Document Reviewed: 07/01/2013 Elsevier Interactive Patient Education 2016 [employer] PATIENT INSTRUCTIONS POST-ANESTHESIA  IMMEDIATELY FOLLOWING SURGERY:  Do not drive or operate machinery for the first twenty four hours after surgery.  Do not make any important decisions for twenty four hours after surgery or while taking narcotic pain medications or sedatives.  If you develop intractable nausea and vomiting or a severe headache please notify your doctor immediately.  FOLLOW-UP:  Please make an appointment with your surgeon as instructed. You do not need to follow up with anesthesia unless specifically instructed to do so.  WOUND CARE INSTRUCTIONS (if applicable):  Keep a dry clean dressing on the anesthesia/puncture wound site if there is drainage.  Once the wound has quit draining you may leave it open to air.  Generally you should leave the bandage intact for twenty  four hours unless there is drainage.  If the epidural site drains for more than 36-48 hours please call the anesthesia department.  QUESTIONS?:  Please feel free to call your physician or the hospital operator if you have any questions, and they will be happy to assist you.

## 2015-12-22 ENCOUNTER — Other Ambulatory Visit: Payer: Self-pay

## 2015-12-22 ENCOUNTER — Encounter (HOSPITAL_COMMUNITY)
Admission: RE | Admit: 2015-12-22 | Discharge: 2015-12-22 | Disposition: A | Discharge status: Home / Self Care | Payer: Medicare Other | Source: Ambulatory Visit | Attending: Internal Medicine | Admitting: Internal Medicine

## 2015-12-22 ENCOUNTER — Other Ambulatory Visit (HOSPITAL_COMMUNITY): Payer: Medicare Other

## 2015-12-22 ENCOUNTER — Encounter (HOSPITAL_COMMUNITY): Payer: Self-pay

## 2015-12-22 ENCOUNTER — Ambulatory Visit (HOSPITAL_COMMUNITY)
Admission: RE | Admit: 2015-12-22 | Discharge: 2015-12-22 | Disposition: A | Discharge status: Home / Self Care | Payer: Medicare Other | Source: Ambulatory Visit | Attending: Gastroenterology | Admitting: Gastroenterology

## 2015-12-22 DIAGNOSIS — Z01818 Encounter for other preprocedural examination: Secondary | ICD-10-CM | POA: Insufficient documentation

## 2015-12-22 DIAGNOSIS — N949 Unspecified condition associated with female genital organs and menstrual cycle: Secondary | ICD-10-CM | POA: Diagnosis not present

## 2015-12-22 HISTORY — DX: Hypothyroidism, unspecified: E03.9

## 2015-12-22 HISTORY — DX: Fibromyalgia: M79.7

## 2015-12-22 HISTORY — DX: Pure hypercholesterolemia, unspecified: E78.00

## 2015-12-22 HISTORY — DX: Unspecified osteoarthritis, unspecified site: M19.90

## 2015-12-22 HISTORY — DX: Other lesions of oral mucosa: K13.79

## 2015-12-22 LAB — BASIC METABOLIC PANEL
Anion gap: 11 (ref 5–15)
BUN: 17 mg/dL (ref 6–20)
CO2: 26 mmol/L (ref 22–32)
CREATININE: 0.71 mg/dL (ref 0.44–1.00)
Calcium: 9.4 mg/dL (ref 8.9–10.3)
Chloride: 103 mmol/L (ref 101–111)
GFR calc Af Amer: >60 mL/min (ref >60)
GFR calc non Af Amer: >60 mL/min (ref >60)
Glucose, Bld: 125 mg/dL — ABNORMAL HIGH (ref 65–99)
Potassium: 4.4 mmol/L (ref 3.5–5.1)
Sodium: 140 mmol/L (ref 135–145)

## 2015-12-22 LAB — CBC WITH DIFFERENTIAL/PLATELET
Basophils Absolute: 0 10*3/uL (ref 0.0–0.1)
Basophils Relative: 1 %
EOS PCT: 2 %
Eosinophils Absolute: 0.2 10*3/uL (ref 0.0–0.7)
HCT: 45.6 % (ref 36.0–46.0)
Hemoglobin: 15.3 g/dL — ABNORMAL HIGH (ref 12.0–15.0)
LYMPHS PCT: 36 %
Lymphs Abs: 3 10*3/uL (ref 0.7–4.0)
MCH: 29.7 pg (ref 26.0–34.0)
MCHC: 33.6 g/dL (ref 30.0–36.0)
MCV: 88.5 fL (ref 78.0–100.0)
MONO ABS: 0.7 10*3/uL (ref 0.1–1.0)
MONOS PCT: 8 %
Neutro Abs: 4.4 10*3/uL (ref 1.7–7.7)
Neutrophils Relative %: 53 %
Platelets: 228 10*3/uL (ref 150–400)
RBC: 5.15 MIL/uL — ABNORMAL HIGH (ref 3.87–5.11)
RDW: 12.6 % (ref 11.5–15.5)
WBC: 8.3 10*3/uL (ref 4.0–10.5)

## 2015-12-22 NOTE — Pre-Procedure Instructions (Signed)
Patient reports history of intermittent "Uvular swelling". Dr Patsey Berthold notified.No orders obtained.

## 2015-12-24 ENCOUNTER — Ambulatory Visit (HOSPITAL_COMMUNITY)
Admission: RE | Admit: 2015-12-24 | Discharge: 2015-12-24 | Disposition: A | Discharge status: Home / Self Care | Payer: Medicare Other | Source: Ambulatory Visit | Attending: Internal Medicine | Admitting: Internal Medicine

## 2015-12-24 ENCOUNTER — Telehealth: Payer: Self-pay | Admitting: Gastroenterology

## 2015-12-24 ENCOUNTER — Ambulatory Visit (HOSPITAL_COMMUNITY): Payer: Medicare Other | Admitting: Anesthesiology

## 2015-12-24 ENCOUNTER — Encounter (HOSPITAL_COMMUNITY): Payer: Self-pay | Admitting: *Deleted

## 2015-12-24 ENCOUNTER — Encounter (HOSPITAL_COMMUNITY)
Admission: RE | Disposition: A | Discharge status: Home / Self Care | Payer: Self-pay | Source: Ambulatory Visit | Attending: Internal Medicine

## 2015-12-24 DIAGNOSIS — D125 Benign neoplasm of sigmoid colon: Secondary | ICD-10-CM | POA: Diagnosis not present

## 2015-12-24 DIAGNOSIS — Z1211 Encounter for screening for malignant neoplasm of colon: Secondary | ICD-10-CM | POA: Insufficient documentation

## 2015-12-24 DIAGNOSIS — I10 Essential (primary) hypertension: Secondary | ICD-10-CM | POA: Diagnosis not present

## 2015-12-24 DIAGNOSIS — K580 Irritable bowel syndrome with diarrhea: Secondary | ICD-10-CM | POA: Insufficient documentation

## 2015-12-24 DIAGNOSIS — K219 Gastro-esophageal reflux disease without esophagitis: Secondary | ICD-10-CM | POA: Diagnosis not present

## 2015-12-24 DIAGNOSIS — E039 Hypothyroidism, unspecified: Secondary | ICD-10-CM | POA: Insufficient documentation

## 2015-12-24 DIAGNOSIS — Z7982 Long term (current) use of aspirin: Secondary | ICD-10-CM | POA: Insufficient documentation

## 2015-12-24 DIAGNOSIS — Z79899 Other long term (current) drug therapy: Secondary | ICD-10-CM | POA: Diagnosis not present

## 2015-12-24 DIAGNOSIS — K648 Other hemorrhoids: Secondary | ICD-10-CM | POA: Diagnosis not present

## 2015-12-24 DIAGNOSIS — F329 Major depressive disorder, single episode, unspecified: Secondary | ICD-10-CM | POA: Insufficient documentation

## 2015-12-24 DIAGNOSIS — G2581 Restless legs syndrome: Secondary | ICD-10-CM | POA: Diagnosis not present

## 2015-12-24 DIAGNOSIS — Z6841 Body Mass Index (BMI) 40.0 and over, adult: Secondary | ICD-10-CM | POA: Diagnosis not present

## 2015-12-24 DIAGNOSIS — E119 Type 2 diabetes mellitus without complications: Secondary | ICD-10-CM | POA: Diagnosis not present

## 2015-12-24 DIAGNOSIS — K649 Unspecified hemorrhoids: Secondary | ICD-10-CM | POA: Insufficient documentation

## 2015-12-24 DIAGNOSIS — M329 Systemic lupus erythematosus, unspecified: Secondary | ICD-10-CM | POA: Diagnosis not present

## 2015-12-24 DIAGNOSIS — Z791 Long term (current) use of non-steroidal anti-inflammatories (NSAID): Secondary | ICD-10-CM | POA: Insufficient documentation

## 2015-12-24 DIAGNOSIS — Z86010 Personal history of colonic polyps: Secondary | ICD-10-CM | POA: Insufficient documentation

## 2015-12-24 DIAGNOSIS — Z7984 Long term (current) use of oral hypoglycemic drugs: Secondary | ICD-10-CM | POA: Diagnosis not present

## 2015-12-24 DIAGNOSIS — D120 Benign neoplasm of cecum: Secondary | ICD-10-CM | POA: Insufficient documentation

## 2015-12-24 DIAGNOSIS — Z800 Family history of malignant neoplasm of digestive organs: Secondary | ICD-10-CM | POA: Diagnosis not present

## 2015-12-24 DIAGNOSIS — K5520 Angiodysplasia of colon without hemorrhage: Secondary | ICD-10-CM | POA: Insufficient documentation

## 2015-12-24 DIAGNOSIS — M797 Fibromyalgia: Secondary | ICD-10-CM | POA: Insufficient documentation

## 2015-12-24 DIAGNOSIS — Q438 Other specified congenital malformations of intestine: Secondary | ICD-10-CM | POA: Insufficient documentation

## 2015-12-24 HISTORY — PX: COLONOSCOPY WITH PROPOFOL: SHX5780

## 2015-12-24 HISTORY — PX: POLYPECTOMY: SHX5525

## 2015-12-24 LAB — GLUCOSE, CAPILLARY
GLUCOSE-CAPILLARY: 96 mg/dL (ref 65–99)
Glucose-Capillary: 142 mg/dL — ABNORMAL HIGH (ref 65–99)

## 2015-12-24 SURGERY — COLONOSCOPY WITH PROPOFOL
Anesthesia: Monitor Anesthesia Care

## 2015-12-24 MED ORDER — LACTATED RINGERS IV SOLN
INTRAVENOUS | Status: DC
  Administered 2015-12-24: 10:00:00 via INTRAVENOUS

## 2015-12-24 MED ORDER — FENTANYL CITRATE (PF) 100 MCG/2ML IJ SOLN
INTRAMUSCULAR | Status: AC
  Filled 2015-12-24: qty 2

## 2015-12-24 MED ORDER — PROPOFOL 500 MG/50ML IV EMUL
INTRAVENOUS | Status: DC | PRN
  Administered 2015-12-24: 10:00:00 via INTRAVENOUS
  Administered 2015-12-24: 125 ug/kg/min via INTRAVENOUS
  Administered 2015-12-24: 10:00:00 via INTRAVENOUS

## 2015-12-24 MED ORDER — FENTANYL CITRATE (PF) 100 MCG/2ML IJ SOLN: 25.0000 ug | INTRAMUSCULAR | Status: DC | PRN

## 2015-12-24 MED ORDER — GLYCOPYRROLATE 0.2 MG/ML IJ SOLN
0.2000 mg | Freq: Once | INTRAMUSCULAR | Status: AC
  Administered 2015-12-24: 0.2 mg via INTRAVENOUS

## 2015-12-24 MED ORDER — PROPOFOL 10 MG/ML IV BOLUS
INTRAVENOUS | Status: AC
  Filled 2015-12-24: qty 40

## 2015-12-24 MED ORDER — MIDAZOLAM HCL 2 MG/2ML IJ SOLN
INTRAMUSCULAR | Status: AC
  Filled 2015-12-24: qty 2

## 2015-12-24 MED ORDER — GLYCOPYRROLATE 0.2 MG/ML IJ SOLN
INTRAMUSCULAR | Status: AC
  Filled 2015-12-24: qty 1

## 2015-12-24 MED ORDER — ONDANSETRON HCL 4 MG/2ML IJ SOLN: 4.0000 mg | Freq: Once | INTRAMUSCULAR | Status: DC | PRN

## 2015-12-24 MED ORDER — FENTANYL CITRATE (PF) 100 MCG/2ML IJ SOLN
25.0000 ug | INTRAMUSCULAR | Status: AC
  Administered 2015-12-24: 25 ug via INTRAVENOUS

## 2015-12-24 MED ORDER — MIDAZOLAM HCL 2 MG/2ML IJ SOLN
1.0000 mg | INTRAMUSCULAR | Status: DC | PRN
  Administered 2015-12-24: 2 mg via INTRAVENOUS

## 2015-12-24 NOTE — Telephone Encounter (Signed)
Patient undergoing colonoscopy today. I was going to order an elastography at time of visit, but I somehow overlooked putting this on the encounter sheet. She needs an elastography due to history of fatty liver.

## 2015-12-24 NOTE — H&P (View-Only) (Signed)
Referring Provider: Chipper Herb, MD Primary Care Physician:  Gearlean Alf., PA-C  Primary GI: Dr. Gala Romney   Chief Complaint  Patient presents with  . Follow-up  . Medication Refill    HPI:   [name] is a 55 y.o. female presenting today with a history of GERD, IBS-D. Last colonoscopy in April 2014 with a poor prep and incomplete. Last complete colonoscopy 2006 with internal/external hemorrhoids and proctitis. She was due for early interval colonoscopy 2015 with an extended prep due to last inadequate prep but did not complete this. Presents now to discuss.   Dexilant once daily. No solid food dysphagia. A few times was swallowing saliva and had to swallow "hard", "nothing major". Has to immediately go to bathroom after eating. Loose stool. Chronic issue. Sometimes will break out in a sweat. Has intermittent low-volume hematochezia. Poor appetite. Doesn't eat breakfast or lunch but eats a big meal at dinner. No N/V. Weight gain noted after review of records.   She has vague lower abdominal discomfort, unable to clearly define. Last imaging on file from Jan 2016 noting multilocular left adnexal cyst, recommending pelvic ultrasound for characterization. She has not had further work-up for this.   Past Medical History  Diagnosis Date  . Lupus ([place])   . Diabetes mellitus without complication ([place])   . Hypertension   . Kidney stone   . Depression   . GERD (gastroesophageal reflux disease)   . Restless leg   . IBS (irritable bowel syndrome)   . Graves disease     s/p radioactive therapy  . Pancreatitis     Past Surgical History  Procedure Laterality Date  . Cholecystectomy    . Abdominal hysterectomy    . Colonoscopy  2006    RMR: internal/external hemorrhoids, proctitis  . Colonoscopy N/A 02/06/2013    RMR: anal canal hemorrhoids, inadequate prep. Needs screening again in April 2015    Current Outpatient Prescriptions  Medication Sig Dispense Refill  . amLODipine  (NORVASC) 5 MG tablet Take 5 mg by mouth daily.    Marland Kitchen aspirin EC 81 MG tablet Take 81 mg by mouth daily.    Marland Kitchen atorvastatin (LIPITOR) 40 MG tablet TAKE 1 TABLET (40 MG TOTAL) BY MOUTH DAILY. 30 tablet 1  . dexlansoprazole (DEXILANT) 60 MG capsule Take 1 capsule (60 mg total) by mouth daily. 30 capsule 1  . DULoxetine (CYMBALTA) 60 MG capsule Take 60 mg by mouth daily.     . fluticasone (FLONASE) 50 MCG/ACT nasal spray Place 2 sprays into the nose as needed for rhinitis or allergies.    . hydrochlorothiazide (HYDRODIURIL) 25 MG tablet Take 25 mg by mouth daily.    . hydroxychloroquine (PLAQUENIL) 200 MG tablet Take 200 mg by mouth daily.    Marland Kitchen ibuprofen (ADVIL,MOTRIN) 600 MG tablet Take 1 tablet (600 mg total) by mouth every 8 (eight) hours as needed for pain. 30 tablet 0  . levothyroxine (SYNTHROID, LEVOTHROID) 200 MCG tablet TAKE 1 TABLET BY MOUTH EVERY DAY (Patient taking differently: TAKE 1 TABLET BY MOUTH EVERY DAY ALONG WITH ONE 25MCG.) 30 tablet 4  . levothyroxine (SYNTHROID, LEVOTHROID) 25 MCG tablet Take 25 mcg by mouth daily before breakfast. Takes along with the 273mcg to equal 224mcg    . lisinopril (PRINIVIL,ZESTRIL) 40 MG tablet Take 40 mg by mouth daily.    Marland Kitchen loratadine (CLARITIN) 10 MG tablet Take 10 mg by mouth daily.    . metFORMIN (GLUCOPHAGE) 500 MG tablet Take 1  tablet (500 mg total) by mouth daily with breakfast. 30 tablet 1  . methocarbamol (ROBAXIN) 750 MG tablet Take 750 mg by mouth daily as needed (for muscle cramps or pains).    . pramipexole (MIRAPEX) 0.125 MG tablet Take 0.125 mg by mouth daily as needed (restles legs).     . vitamin D, CHOLECALCIFEROL, 400 UNITS tablet Take 400 Units by mouth daily.    Marland Kitchen atorvastatin (LIPITOR) 40 MG tablet TAKE 1 TABLET BY MOUTH EVERY DAY (Patient not taking: Reported on 12/02/2015) 30 tablet 0  . cephALEXin (KEFLEX) 500 MG capsule Take 1 capsule (500 mg total) by mouth 2 (two) times daily. (Patient not taking: Reported on 12/01/2014) 14  capsule 0  . oxyCODONE-acetaminophen (PERCOCET) 5-325 MG per tablet Take 2 tablets by mouth every 4 (four) hours as needed for pain. (Patient not taking: Reported on 12/01/2014) 20 tablet 0   No current facility-administered medications for this visit.    Allergies as of 12/02/2015  . (No Known Allergies)    Family History  Problem Relation Age of Onset  . Colon cancer Father     patient was adopted, but she knows birth father. Believes he had colon cancer    Social History   Social History  . Marital Status: Divorced    Spouse Name: N/A  . Number of Children: N/A  . Years of Education: N/A   Social History Main Topics  . Smoking status: Never Smoker   . Smokeless tobacco: None  . Alcohol Use: No  . Drug Use: No  . Sexual Activity: Not Asked   Other Topics Concern  . None   Social History Narrative    Review of Systems: As mentioned in HPI.   Physical Exam: BP 136/75 mmHg  Pulse 96  Temp(Src) 97.6 F (36.4 C) (Oral)  Ht 5\' 6"  (1.676 m)  Wt 340 lb 6.4 oz (154.404 kg)  BMI 54.97 kg/m2 General:   Alert and oriented. No distress noted. Pleasant and cooperative.  Head:  Normocephalic and atraumatic. Eyes:  Conjuctiva clear without scleral icterus. Mouth:  Oral mucosa pink and moist. Good dentition. No lesions. Heart:  S1, S2 present without murmurs, rubs, or gallops. Regular rate and rhythm. Abdomen:  +BS, soft, non-tender and non-distended. No rebound or guarding. Obese  Msk:  Symmetrical without gross deformities. Normal posture. Extremities:  1-2+ lower extremity edema Neurologic:  Alert and  oriented x4;  grossly normal neurologically. Psych:  Alert and cooperative. Normal mood and affect.  Lab Results  Component Value Date   ALT 44* 12/01/2014   AST 33 12/01/2014   ALKPHOS 85 12/01/2014   BILITOT 0.6 12/01/2014

## 2015-12-24 NOTE — Interval H&P Note (Signed)
History and Physical Interval Note:  12/24/2015 9:42 AM  [name]  has presented today for surgery, with the diagnosis of screening colonoscopy, family history of colon cancer  The various methods of treatment have been discussed with the patient and family. After consideration of risks, benefits and other options for treatment, the patient has consented to  Procedure(s) with comments: COLONOSCOPY WITH PROPOFOL (N/A) - 1000 ESOPHAGOGASTRODUODENOSCOPY (EGD) WITH PROPOFOL (N/A) as a surgical intervention .  The patient's history has been reviewed, patient examined, no change in status, stable for surgery.  I have reviewed the patient's chart and labs.  Questions were answered to the patient's satisfaction.     [name]  Patient has not yet had elastography done.  Patient has no upper GI tract symptoms. Consequently, high risk screening colonoscopy only today per plan.  The risks, benefits, limitations, alternatives and imponderables have been reviewed with the patient. Questions have been answered. All parties are agreeable.

## 2015-12-24 NOTE — Discharge Instructions (Signed)
Colonoscopy Discharge Instructions  Read the instructions outlined below and refer to this sheet in the next few weeks. These discharge instructions provide you with general information on caring for yourself after you leave the hospital. Your doctor may also give you specific instructions. While your treatment has been planned according to the most current medical practices available, unavoidable complications occasionally occur. If you have any problems or questions after discharge, call Dr. Gala Romney at [phone]. ACTIVITY  You may resume your regular activity, but move at a slower pace for the next 24 hours.   Take frequent rest periods for the next 24 hours.   Walking will help get rid of the air and reduce the bloated feeling in your belly (abdomen).   No driving for 24 hours (because of the medicine (anesthesia) used during the test).    Do not sign any important legal documents or operate any machinery for 24 hours (because of the anesthesia used during the test).  NUTRITION  Drink plenty of fluids.   You may resume your normal diet as instructed by your doctor.   Begin with a light meal and progress to your normal diet. Heavy or fried foods are harder to digest and may make you feel sick to your stomach (nauseated).   Avoid alcoholic beverages for 24 hours or as instructed.  MEDICATIONS  You may resume your normal medications unless your doctor tells you otherwise.  WHAT YOU CAN EXPECT TODAY  Some feelings of bloating in the abdomen.   Passage of more gas than usual.   Spotting of blood in your stool or on the toilet paper.  IF YOU HAD POLYPS REMOVED DURING THE COLONOSCOPY:  No aspirin products for 7 days or as instructed.   No alcohol for 7 days or as instructed.   Eat a soft diet for the next 24 hours.  FINDING OUT THE RESULTS OF YOUR TEST Not all test results are available during your visit. If your test results are not back during the visit, make an appointment  with your caregiver to find out the results. Do not assume everything is normal if you have not heard from your caregiver or the medical facility. It is important for you to follow up on all of your test results.  SEEK IMMEDIATE MEDICAL ATTENTION IF:  You have more than a spotting of blood in your stool.   Your belly is swollen (abdominal distention).   You are nauseated or vomiting.   You have a temperature over 101.   You have abdominal pain or discomfort that is severe or gets worse throughout the day.   Colon polyp and hemorrhoid information provided  Further recommendations to follow pending review of pathology report   Hemorrhoids Hemorrhoids are swollen veins around the rectum or anus. There are two types of hemorrhoids:   Internal hemorrhoids. These occur in the veins just inside the rectum. They may poke through to the outside and become irritated and painful.  External hemorrhoids. These occur in the veins outside the anus and can be felt as a painful swelling or hard lump near the anus. CAUSES  Pregnancy.   Obesity.   Constipation or diarrhea.   Straining to have a bowel movement.   Sitting for long periods on the toilet.  Heavy lifting or other activity that caused you to strain.  Anal intercourse. SYMPTOMS   Pain.   Anal itching or irritation.   Rectal bleeding.   Fecal leakage.   Anal swelling.   One  or more lumps around the anus.  DIAGNOSIS  Your caregiver may be able to diagnose hemorrhoids by visual examination. Other examinations or tests that may be performed include:   Examination of the rectal area with a gloved hand (digital rectal exam).   Examination of anal canal using a small tube (scope).   A blood test if you have lost a significant amount of blood.  A test to look inside the colon (sigmoidoscopy or colonoscopy). TREATMENT Most hemorrhoids can be treated at home. However, if symptoms do not seem to be getting  better or if you have a lot of rectal bleeding, your caregiver may perform a procedure to help make the hemorrhoids get smaller or remove them completely. Possible treatments include:   Placing a rubber band at the base of the hemorrhoid to cut off the circulation (rubber band ligation).   Injecting a chemical to shrink the hemorrhoid (sclerotherapy).   Using a tool to burn the hemorrhoid (infrared light therapy).   Surgically removing the hemorrhoid (hemorrhoidectomy).   Stapling the hemorrhoid to block blood flow to the tissue (hemorrhoid stapling).  HOME CARE INSTRUCTIONS   Eat foods with fiber, such as whole grains, beans, nuts, fruits, and vegetables. Ask your doctor about taking products with added fiber in them (fibersupplements).  Increase fluid intake. Drink enough water and fluids to keep your urine clear or pale yellow.   Exercise regularly.   Go to the bathroom when you have the urge to have a bowel movement. Do not wait.   Avoid straining to have bowel movements.   Keep the anal area dry and clean. Use wet toilet paper or moist towelettes after a bowel movement.   Medicated creams and suppositories may be used or applied as directed.   Only take over-the-counter or prescription medicines as directed by your caregiver.   Take warm sitz baths for 15-20 minutes, 3-4 times a day to ease pain and discomfort.   Place ice packs on the hemorrhoids if they are tender and swollen. Using ice packs between sitz baths may be helpful.   Put ice in a plastic bag.   Place a towel between your skin and the bag.   Leave the ice on for 15-20 minutes, 3-4 times a day.   Do not use a donut-shaped pillow or sit on the toilet for long periods. This increases blood pooling and pain.  SEEK MEDICAL CARE IF:  You have increasing pain and swelling that is not controlled by treatment or medicine.  You have uncontrolled bleeding.  You have difficulty or you are unable  to have a bowel movement.  You have pain or inflammation outside the area of the hemorrhoids. MAKE SURE YOU:  Understand these instructions.  Will watch your condition.  Will get help right away if you are not doing well or get worse.   This information is not intended to replace advice given to you by your health care provider. Make sure you discuss any questions you have with your health care provider.   Document Released: 10/21/2000 Document Revised: 10/10/2012 Document Reviewed: 08/28/2012 Elsevier Interactive Patient Education 2016 Elsevier Inc. Colon Polyps Polyps are lumps of extra tissue growing inside the body. Polyps can grow in the large intestine (colon). Most colon polyps are noncancerous (benign). However, some colon polyps can become cancerous over time. Polyps that are larger than a pea may be harmful. To be safe, caregivers remove and test all polyps. CAUSES  Polyps form when mutations in the genes  cause your cells to grow and divide even though no more tissue is needed. RISK FACTORS There are a number of risk factors that can increase your chances of getting colon polyps. They include:  Being older than 50 years.  Family history of colon polyps or colon cancer.  Long-term colon diseases, such as colitis or Crohn disease.  Being overweight.  Smoking.  Being inactive.  Drinking too much alcohol. SYMPTOMS  Most small polyps do not cause symptoms. If symptoms are present, they may include:  Blood in the stool. The stool may look dark red or black.  Constipation or diarrhea that lasts longer than 1 week. DIAGNOSIS People often do not know they have polyps until their caregiver finds them during a regular checkup. Your caregiver can use 4 tests to check for polyps:  Digital rectal exam. The caregiver wears gloves and feels inside the rectum. This test would find polyps only in the rectum.  Barium enema. The caregiver puts a liquid called barium into your  rectum before taking X-rays of your colon. Barium makes your colon look white. Polyps are dark, so they are easy to see in the X-ray pictures.  Sigmoidoscopy. A thin, flexible tube (sigmoidoscope) is placed into your rectum. The sigmoidoscope has a light and tiny camera in it. The caregiver uses the sigmoidoscope to look at the last third of your colon.  Colonoscopy. This test is like sigmoidoscopy, but the caregiver looks at the entire colon. This is the most common method for finding and removing polyps. TREATMENT  Any polyps will be removed during a sigmoidoscopy or colonoscopy. The polyps are then tested for cancer. PREVENTION  To help lower your risk of getting more colon polyps:  Eat plenty of fruits and vegetables. Avoid eating fatty foods.  Do not smoke.  Avoid drinking alcohol.  Exercise every day.  Lose weight if recommended by your caregiver.  Eat plenty of calcium and folate. Foods that are rich in calcium include milk, cheese, and broccoli. Foods that are rich in folate include chickpeas, kidney beans, and spinach. HOME CARE INSTRUCTIONS Keep all follow-up appointments as directed by your caregiver. You may need periodic exams to check for polyps. SEEK MEDICAL CARE IF: You notice bleeding during a bowel movement.   This information is not intended to replace advice given to you by your health care provider. Make sure you discuss any questions you have with your health care provider.   Document Released: 07/20/2004 Document Revised: 11/14/2014 Document Reviewed: 01/03/2012 Elsevier Interactive Patient Education [employer].

## 2015-12-24 NOTE — Anesthesia Postprocedure Evaluation (Signed)
Anesthesia Post Note  Patient: [name]  Procedure(s) Performed: Procedure(s) (LRB): COLONOSCOPY WITH PROPOFOL (N/A) POLYPECTOMY  Patient location during evaluation: PACU Anesthesia Type: MAC Level of consciousness: awake and alert Pain management: pain level controlled Vital Signs Assessment: post-procedure vital signs reviewed and stable Respiratory status: spontaneous breathing Cardiovascular status: blood pressure returned to baseline Postop Assessment: no signs of nausea or vomiting Anesthetic complications: no    Last Vitals:  Filed Vitals:   12/24/15 1045 12/24/15 1100  BP: 137/123   Pulse: 76 82  Temp: 36.6 C   Resp: 19 21    Last Pain:  Filed Vitals:   12/24/15 1101  PainSc: 0-No pain                 Tiersa Dayley

## 2015-12-24 NOTE — Transfer of Care (Signed)
Immediate Anesthesia Transfer of Care Note  Patient: [name]  Procedure(s) Performed: Procedure(s) with comments: COLONOSCOPY WITH PROPOFOL (N/A) - 1000  Patient Location: PACU  Anesthesia Type:MAC  Level of Consciousness: awake and alert   Airway & Oxygen Therapy: Patient Spontanous Breathing and Patient connected to nasal cannula oxygen  Post-op Assessment: Report given to RN  Post vital signs: Reviewed and stable  Last Vitals:  Filed Vitals:   12/24/15 0940 12/24/15 0945  BP:    Pulse:    Temp:    Resp: 19 18    Complications: No apparent anesthesia complications

## 2015-12-24 NOTE — Anesthesia Preprocedure Evaluation (Signed)
Anesthesia Evaluation  Patient identified by MRN, date of birth, ID band Patient awake    Reviewed: Allergy & Precautions, NPO status , Patient's Chart, lab work & pertinent test results  Airway Mallampati: II  TM Distance: >3 FB     Dental  (+) Teeth Intact   Pulmonary neg pulmonary ROS,    breath sounds clear to auscultation       Cardiovascular hypertension,  Rhythm:Regular Rate:Normal     Neuro/Psych PSYCHIATRIC DISORDERS Depression    GI/Hepatic GERD  ,  Endo/Other  diabetes, Type 2, Oral Hypoglycemic AgentsHypothyroidism Morbid obesity  Renal/GU      Musculoskeletal  (+) Fibromyalgia -  Abdominal   Peds  Hematology   Anesthesia Other Findings   Reproductive/Obstetrics                             Anesthesia Physical Anesthesia Plan  ASA: III  Anesthesia Plan: MAC   Post-op Pain Management:    Induction: Intravenous  Airway Management Planned: Simple Face Mask  Additional Equipment:   Intra-op Plan:   Post-operative Plan:   Informed Consent: I have reviewed the patients History and Physical, chart, labs and discussed the procedure including the risks, benefits and alternatives for the proposed anesthesia with the patient or authorized representative who has indicated his/her understanding and acceptance.     Plan Discussed with: CRNA and Surgeon  Anesthesia Plan Comments: (Hx of uvula edema post procedure. Surgeon advised.)        Anesthesia Quick Evaluation

## 2015-12-24 NOTE — Op Note (Signed)
[facility] [address] [place], [address]   COLONOSCOPY PROCEDURE REPORT  PATIENT: Carol, Vang  MR#: [id number] BIRTHDATE: [birth year] , 39  yrs. old GENDER: female ENDOSCOPIST: R.  Garfield Cornea, MD FACP [facility] REFERRED [id number] Laurance Flatten, M.D. PROCEDURE DATE:  01-18-2016 PROCEDURE:   Colonoscopy with snare polypectomy INDICATIONS:high-risk screening examination. MEDICATIONS: Deep sedation per Dr.  [facility] ASA CLASS:       Class III  CONSENT: The risks, benefits, alternatives and imponderables including but not limited to bleeding, perforation as well as the possibility of a missed lesion have been reviewed.  The potential for biopsy, lesion removal, etc. have also been discussed. Questions have been answered.  All parties agreeable.  Please see the history and physical in the medical record for more information.  DESCRIPTION OF PROCEDURE:   After the risks benefits and alternatives of the procedure were thoroughly explained, informed consent was obtained.  The digital rectal exam revealed no abnormalities of the rectum.   The [id number]  endoscope was introduced through the anus and advanced to the cecum, which was identified by both the appendix and ileocecal valve. No adverse events experienced.   The quality of the prep was adequate  The instrument was then slowly withdrawn as the colon was fully examined. Estimated blood loss is zero unless otherwise noted in this procedure report.      COLON FINDINGS: Internal hemorrhoids; otherwise, normal-appearing rectal mucosa.  Redundant colon.  (1) 5 mm polyp in the midsigmoid segment and a second 5 mL polyp in the cecum.  The remainder of the colonic mucosa appeared normal except for a couple of tiny cecal AVMs.  The above-mentioned polyps were cold snare removed.  Retroflexion was performed. .  Withdrawal time=22 minutes 0 seconds.  The scope was withdrawn and the procedure  completed. COMPLICATIONS: There were no immediate complications. EBL 3 mL ENDOSCOPIC IMPRESSION: Redundant colon. Colonic diverticulosis. Hemorrhoids.   Cecal AVMs   RECOMMENDATIONS: Follow-up on pathology. Screening EGD not done today as patient has not had elastography yet.  eSigned:  R. Garfield Cornea, MD Rosalita Chessman [facility] 2016/01/18 10:33 AM   cc:  CPT CODES: ICD CODES:  The ICD and CPT codes recommended by this software are interpretations from the data that the clinical staff has captured with the software.  The verification of the translation of this report to the ICD and CPT codes and modifiers is the sole responsibility of the health care institution and practicing physician where this report was generated.  [place]. will not be held responsible for the validity of the ICD and CPT codes included on this report.  AMA assumes no liability for data contained or not contained herein. CPT is a Designer, television/film set of the [employer].  PATIENT NAME:  Carol, Vang MR#: [id number]

## 2015-12-26 ENCOUNTER — Encounter: Payer: Self-pay | Admitting: Internal Medicine

## 2015-12-28 ENCOUNTER — Other Ambulatory Visit: Payer: Self-pay

## 2015-12-28 ENCOUNTER — Encounter (HOSPITAL_COMMUNITY): Payer: Self-pay | Admitting: Internal Medicine

## 2015-12-28 ENCOUNTER — Telehealth: Payer: Self-pay

## 2015-12-28 ENCOUNTER — Encounter: Payer: Self-pay | Admitting: Internal Medicine

## 2015-12-28 DIAGNOSIS — K760 Fatty (change of) liver, not elsewhere classified: Secondary | ICD-10-CM

## 2015-12-28 NOTE — Telephone Encounter (Signed)
Per RMR-  Send letter to patient.  Send copy of letter with path to referring provider and PCP.   OV with extender in 2 months if not already scheduled

## 2015-12-28 NOTE — Telephone Encounter (Signed)
[place] LETTER MAILED

## 2015-12-28 NOTE — Telephone Encounter (Signed)
Letter mailed to the pt. 

## 2015-12-28 NOTE — Telephone Encounter (Signed)
Pt is set up for [place] on 01/04/16 @ 9:00. She does not need a PA for the [place]. LMOM for her to call me back

## 2016-01-04 ENCOUNTER — Ambulatory Visit (HOSPITAL_COMMUNITY)
Admission: RE | Admit: 2016-01-04 | Discharge: 2016-01-04 | Disposition: A | Discharge status: Home / Self Care | Payer: Medicare Other | Source: Ambulatory Visit | Attending: Gastroenterology | Admitting: Gastroenterology

## 2016-01-04 DIAGNOSIS — K760 Fatty (change of) liver, not elsewhere classified: Secondary | ICD-10-CM

## 2016-01-04 DIAGNOSIS — N200 Calculus of kidney: Secondary | ICD-10-CM | POA: Diagnosis not present

## 2016-01-04 DIAGNOSIS — Z9049 Acquired absence of other specified parts of digestive tract: Secondary | ICD-10-CM | POA: Insufficient documentation

## 2016-01-05 NOTE — Progress Notes (Signed)
Quick Note:  The pelvic ultrasound completed was limited and ovaries could not be seen due to body habitus. I recommend following up with GYN due to history of adnexal abnormalities on prior imaging. From a GI standpoint, there is not much more we can offer for this. She will be having an elastography in the near future. ______

## 2016-01-10 ENCOUNTER — Encounter (HOSPITAL_COMMUNITY): Payer: Self-pay | Admitting: Emergency Medicine

## 2016-01-10 DIAGNOSIS — Z7984 Long term (current) use of oral hypoglycemic drugs: Secondary | ICD-10-CM | POA: Diagnosis not present

## 2016-01-10 DIAGNOSIS — Z7982 Long term (current) use of aspirin: Secondary | ICD-10-CM | POA: Insufficient documentation

## 2016-01-10 DIAGNOSIS — E7800 Pure hypercholesterolemia, unspecified: Secondary | ICD-10-CM | POA: Insufficient documentation

## 2016-01-10 DIAGNOSIS — E119 Type 2 diabetes mellitus without complications: Secondary | ICD-10-CM | POA: Diagnosis not present

## 2016-01-10 DIAGNOSIS — M797 Fibromyalgia: Secondary | ICD-10-CM | POA: Diagnosis not present

## 2016-01-10 DIAGNOSIS — K219 Gastro-esophageal reflux disease without esophagitis: Secondary | ICD-10-CM | POA: Insufficient documentation

## 2016-01-10 DIAGNOSIS — M1990 Unspecified osteoarthritis, unspecified site: Secondary | ICD-10-CM | POA: Diagnosis not present

## 2016-01-10 DIAGNOSIS — Z7951 Long term (current) use of inhaled steroids: Secondary | ICD-10-CM | POA: Diagnosis not present

## 2016-01-10 DIAGNOSIS — G2581 Restless legs syndrome: Secondary | ICD-10-CM | POA: Diagnosis not present

## 2016-01-10 DIAGNOSIS — Z79899 Other long term (current) drug therapy: Secondary | ICD-10-CM | POA: Insufficient documentation

## 2016-01-10 DIAGNOSIS — N200 Calculus of kidney: Secondary | ICD-10-CM | POA: Diagnosis not present

## 2016-01-10 DIAGNOSIS — R1013 Epigastric pain: Secondary | ICD-10-CM | POA: Diagnosis present

## 2016-01-10 DIAGNOSIS — R197 Diarrhea, unspecified: Secondary | ICD-10-CM | POA: Diagnosis not present

## 2016-01-10 DIAGNOSIS — F329 Major depressive disorder, single episode, unspecified: Secondary | ICD-10-CM | POA: Insufficient documentation

## 2016-01-10 DIAGNOSIS — I10 Essential (primary) hypertension: Secondary | ICD-10-CM | POA: Diagnosis not present

## 2016-01-10 DIAGNOSIS — E039 Hypothyroidism, unspecified: Secondary | ICD-10-CM | POA: Diagnosis not present

## 2016-01-10 LAB — URINALYSIS, ROUTINE W REFLEX MICROSCOPIC
Bilirubin Urine: NEGATIVE
Glucose, UA: NEGATIVE mg/dL
Ketones, ur: NEGATIVE mg/dL
LEUKOCYTES UA: NEGATIVE
NITRITE: NEGATIVE
PH: 5.5 (ref 5.0–8.0)
Protein, ur: 30 mg/dL — AB
SPECIFIC GRAVITY, URINE: 1.023 (ref 1.005–1.030)

## 2016-01-10 LAB — COMPREHENSIVE METABOLIC PANEL
ALK PHOS: 80 U/L (ref 38–126)
ALT: 45 U/L (ref 14–54)
AST: 35 U/L (ref 15–41)
Albumin: 4.3 g/dL (ref 3.5–5.0)
Anion gap: 14 (ref 5–15)
BILIRUBIN TOTAL: 1 mg/dL (ref 0.3–1.2)
BUN: 19 mg/dL (ref 6–20)
CHLORIDE: 99 mmol/L — AB (ref 101–111)
CO2: 27 mmol/L (ref 22–32)
CREATININE: 0.81 mg/dL (ref 0.44–1.00)
Calcium: 9.7 mg/dL (ref 8.9–10.3)
GLUCOSE: 133 mg/dL — AB (ref 65–99)
Potassium: 3.8 mmol/L (ref 3.5–5.1)
Sodium: 140 mmol/L (ref 135–145)
Total Protein: 7.1 g/dL (ref 6.5–8.1)

## 2016-01-10 LAB — URINE MICROSCOPIC-ADD ON: WBC UA: NONE SEEN WBC/hpf (ref 0–5)

## 2016-01-10 LAB — CBC
HCT: 46.2 % — ABNORMAL HIGH (ref 36.0–46.0)
Hemoglobin: 15.5 g/dL — ABNORMAL HIGH (ref 12.0–15.0)
MCH: 29 pg (ref 26.0–34.0)
MCHC: 33.5 g/dL (ref 30.0–36.0)
MCV: 86.5 fL (ref 78.0–100.0)
PLATELETS: 228 10*3/uL (ref 150–400)
RBC: 5.34 MIL/uL — AB (ref 3.87–5.11)
RDW: 12.5 % (ref 11.5–15.5)
WBC: 11.2 10*3/uL — ABNORMAL HIGH (ref 4.0–10.5)

## 2016-01-10 LAB — LIPASE, BLOOD: Lipase: 30 U/L (ref 11–51)

## 2016-01-10 MED ORDER — ONDANSETRON 4 MG PO TBDP
ORAL_TABLET | ORAL | Status: AC
  Filled 2016-01-10: qty 1

## 2016-01-10 MED ORDER — ONDANSETRON 4 MG PO TBDP
4.0000 mg | ORAL_TABLET | Freq: Once | ORAL | Status: AC | PRN
  Administered 2016-01-10: 4 mg via ORAL

## 2016-01-10 NOTE — ED Notes (Signed)
C/o upper abd pain x 1 week with nausea and diarrhea.

## 2016-01-11 ENCOUNTER — Emergency Department (HOSPITAL_COMMUNITY): Payer: Medicare Other

## 2016-01-11 ENCOUNTER — Emergency Department (HOSPITAL_COMMUNITY)
Admission: EM | Admit: 2016-01-11 | Discharge: 2016-01-11 | Disposition: A | Discharge status: Home / Self Care | Payer: Medicare Other | Attending: Emergency Medicine | Admitting: Emergency Medicine

## 2016-01-11 DIAGNOSIS — N200 Calculus of kidney: Secondary | ICD-10-CM

## 2016-01-11 MED ORDER — TAMSULOSIN HCL 0.4 MG PO CAPS: 0.4000 mg | ORAL_CAPSULE | Freq: Every day | ORAL | Status: DC

## 2016-01-11 MED ORDER — HYDROCODONE-IBUPROFEN 7.5-200 MG PO TABS: 1.0000 | ORAL_TABLET | Freq: Four times a day (QID) | ORAL | Status: DC | PRN

## 2016-01-11 MED ORDER — TAMSULOSIN HCL 0.4 MG PO CAPS
0.4000 mg | ORAL_CAPSULE | Freq: Once | ORAL | Status: AC
  Administered 2016-01-11: 0.4 mg via ORAL
  Filled 2016-01-11: qty 1

## 2016-01-11 MED ORDER — ONDANSETRON HCL 4 MG/2ML IJ SOLN
4.0000 mg | Freq: Once | INTRAMUSCULAR | Status: AC
  Administered 2016-01-11: 4 mg via INTRAVENOUS
  Filled 2016-01-11: qty 2

## 2016-01-11 MED ORDER — HYDROMORPHONE HCL 1 MG/ML IJ SOLN
1.0000 mg | Freq: Once | INTRAMUSCULAR | Status: AC
  Administered 2016-01-11: 1 mg via INTRAVENOUS
  Filled 2016-01-11: qty 1

## 2016-01-11 MED ORDER — FENTANYL CITRATE (PF) 100 MCG/2ML IJ SOLN
50.0000 ug | Freq: Once | INTRAMUSCULAR | Status: AC
  Administered 2016-01-11: 50 ug via INTRAVENOUS
  Filled 2016-01-11: qty 2

## 2016-01-11 MED ORDER — SODIUM CHLORIDE 0.9 % IV BOLUS (SEPSIS)
1000.0000 mL | Freq: Once | INTRAVENOUS | Status: AC
  Administered 2016-01-11: 1000 mL via INTRAVENOUS

## 2016-01-11 NOTE — ED Notes (Signed)
Pt ambulatory to room with tech with steady gait

## 2016-01-11 NOTE — Discharge Instructions (Signed)
As discussed, with your identified kidney stone it is very important that he follow-up with your urologist.  Please call today for an appointment.  Return here for concerning changes in your condition. Kidney Stones Kidney stones (urolithiasis) are deposits that form inside your kidneys. The intense pain is caused by the stone moving through the urinary tract. When the stone moves, the ureter goes into spasm around the stone. The stone is usually passed in the urine.  CAUSES   A disorder that makes certain neck glands produce too much parathyroid hormone (primary hyperparathyroidism).  A buildup of uric acid crystals, similar to gout in your joints.  Narrowing (stricture) of the ureter.  A kidney obstruction present at birth (congenital obstruction).  Previous surgery on the kidney or ureters.  Numerous kidney infections. SYMPTOMS   Feeling sick to your stomach (nauseous).  Throwing up (vomiting).  Blood in the urine (hematuria).  Pain that usually spreads (radiates) to the groin.  Frequency or urgency of urination. DIAGNOSIS   Taking a history and physical exam.  Blood or urine tests.  CT scan.  Occasionally, an examination of the inside of the urinary bladder (cystoscopy) is performed. TREATMENT   Observation.  Increasing your fluid intake.  Extracorporeal shock wave lithotripsy--This is a noninvasive procedure that uses shock waves to break up kidney stones.  Surgery may be needed if you have severe pain or persistent obstruction. There are various surgical procedures. Most of the procedures are performed with the use of small instruments. Only small incisions are needed to accommodate these instruments, so recovery time is minimized. The size, location, and chemical composition are all important variables that will determine the proper choice of action for you. Talk to your health care provider to better understand your situation so that you will minimize the risk  of injury to yourself and your kidney.  HOME CARE INSTRUCTIONS   Drink enough water and fluids to keep your urine clear or pale yellow. This will help you to pass the stone or stone fragments.  Strain all urine through the provided strainer. Keep all particulate matter and stones for your health care provider to see. The stone causing the pain may be as small as a grain of salt. It is very important to use the strainer each and every time you pass your urine. The collection of your stone will allow your health care provider to analyze it and verify that a stone has actually passed. The stone analysis will often identify what you can do to reduce the incidence of recurrences.  Only take over-the-counter or prescription medicines for pain, discomfort, or fever as directed by your health care provider.  Keep all follow-up visits as told by your health care provider. This is important.  Get follow-up X-rays if required. The absence of pain does not always mean that the stone has passed. It may have only stopped moving. If the urine remains completely obstructed, it can cause loss of kidney function or even complete destruction of the kidney. It is your responsibility to make sure X-rays and follow-ups are completed. Ultrasounds of the kidney can show blockages and the status of the kidney. Ultrasounds are not associated with any radiation and can be performed easily in a matter of minutes.  Make changes to your daily diet as told by your health care provider. You may be told to:  Limit the amount of salt that you eat.  Eat 5 or more servings of fruits and vegetables each day.  Limit  the amount of meat, poultry, fish, and eggs that you eat.  Collect a 24-hour urine sample as told by your health care provider.You may need to collect another urine sample every 6-12 months. SEEK MEDICAL CARE IF:  You experience pain that is progressive and unresponsive to any pain medicine you have been  prescribed. SEEK IMMEDIATE MEDICAL CARE IF:   Pain cannot be controlled with the prescribed medicine.  You have a fever or shaking chills.  The severity or intensity of pain increases over 18 hours and is not relieved by pain medicine.  You develop a new onset of abdominal pain.  You feel faint or pass out.  You are unable to urinate.   This information is not intended to replace advice given to you by your health care provider. Make sure you discuss any questions you have with your health care provider.   Document Released: 10/24/2005 Document Revised: 07/15/2015 Document Reviewed: 03/27/2013 Elsevier Interactive Patient Education [employer].

## 2016-01-11 NOTE — ED Provider Notes (Signed)
CSN: [id number]     Arrival date & time 01/10/16  2052 History   By signing my name below, I, Forrestine Him, attest that this documentation has been prepared under the direction and in the presence of Carmin Muskrat, MD.  Electronically Signed: Forrestine Him, ED Scribe. 01/11/2016. 1:50 AM.   Chief Complaint  Patient presents with  . Abdominal Pain   Patient is a 55 y.o. female presenting with abdominal pain. The history is provided by the patient. No language interpreter was used.  Abdominal Pain Pain location:  Epigastric Pain quality: sharp   Pain radiates to:  Back Pain severity:  Moderate Onset quality:  Gradual Duration:  1 week Timing:  Constant Progression:  Worsening Chronicity:  Recurrent Relieved by:  None tried Worsened by:  Nothing tried Ineffective treatments:  None tried Associated symptoms: diarrhea   Associated symptoms: no chest pain, no chills, no cough, no fever, no nausea, no shortness of breath and no vomiting     HPI Comments: DENIJAH WELLS is a 55 y.o. female with a PMHx pancreatitis, DM, lupus, HTN, kidney stones, and IBS who presents to the Emergency Department complaining of constant, ongoing epigastric abdominal pain that radiates to R back x 1 week; worsened this evening. No aggravating or alleviating factors at this time. Pt also reports ongoing diarrhea. No OTC medications or home remedies attempted prior to arrival. No recent fever, chills, nausea, or vomiting. Pt admits current symptoms feel similar to previous pancreatitis exacerbation. PSHx includes cholecystectomy 02/06/13 , abdominal hysterectomy, and polypectomy 12/24/15.  PCP: Gearlean Alf PA-C    Past Medical History  Diagnosis Date  . Lupus ([place])   . Diabetes mellitus without complication ([place])   . Hypertension   . Kidney stone   . Depression   . GERD (gastroesophageal reflux disease)   . Restless leg   . IBS (irritable bowel syndrome)   . Graves disease     s/p radioactive therapy   . Pancreatitis   . Uvular swelling     pt states it happened after c-section but since then happens randomly at home.  . Hypothyroidism   . Arthritis   . Fibromyalgia   . Hypercholesteremia    Past Surgical History  Procedure Laterality Date  . Cholecystectomy    . Abdominal hysterectomy    . Colonoscopy  2006    RMR: internal/external hemorrhoids, proctitis  . Colonoscopy N/A 02/06/2013    RMR: anal canal hemorrhoids, inadequate prep. Needs screening again in April 2015  . Colonoscopy with propofol N/A 12/24/2015    Procedure: COLONOSCOPY WITH PROPOFOL;  Surgeon: Daneil Dolin, MD;  Location: [facility];  Service: Endoscopy;  Laterality: N/A;  1000  . Polypectomy  12/24/2015    Procedure: POLYPECTOMY;  Surgeon: Daneil Dolin, MD;  Location: [facility];  Service: Endoscopy;;   Family History  Problem Relation Age of Onset  . Colon cancer Father     patient was adopted, but she knows birth father. Believes he had colon cancer   Social History  Substance Use Topics  . Smoking status: Never Smoker   . Smokeless tobacco: None  . Alcohol Use: No   OB History    No data available     Review of Systems  Constitutional: Negative for fever and chills.  Respiratory: Negative for cough and shortness of breath.   Cardiovascular: Negative for chest pain.  Gastrointestinal: Positive for abdominal pain and diarrhea. Negative for nausea and vomiting.  Musculoskeletal: Positive for  back pain.  Neurological: Negative for headaches.  Psychiatric/Behavioral: Negative for confusion.  All other systems reviewed and are negative.     Allergies  Review of patient's allergies indicates no known allergies.  Home Medications   Prior to Admission medications   Medication Sig Start Date End Date Taking? Authorizing Provider  amLODipine (NORVASC) 5 MG tablet Take 5 mg by mouth daily.   Yes Historical Provider, MD  aspirin EC 81 MG tablet Take 81 mg by mouth daily.   Yes Historical  Provider, MD  atorvastatin (LIPITOR) 40 MG tablet TAKE 1 TABLET (40 MG TOTAL) BY MOUTH DAILY. 03/19/14  Yes Lysbeth Penner, FNP  dexlansoprazole (DEXILANT) 60 MG capsule Take 1 capsule (60 mg total) by mouth daily. 12/02/15  Yes Orvil Feil, NP  dicyclomine (BENTYL) 10 MG capsule Take 1 capsule (10 mg total) by mouth 4 (four) times daily -  before meals and at bedtime. 12/02/15  Yes Orvil Feil, NP  DULoxetine (CYMBALTA) 60 MG capsule Take 60 mg by mouth daily.    Yes Historical Provider, MD  fluticasone (FLONASE) 50 MCG/ACT nasal spray Place 2 sprays into the nose as needed for rhinitis or allergies.   Yes Historical Provider, MD  hydrochlorothiazide (HYDRODIURIL) 25 MG tablet Take 25 mg by mouth daily.   Yes Historical Provider, MD  hydrocortisone (ANUSOL-HC) 2.5 % rectal cream Place 1 application rectally 2 (two) times daily. Patient taking differently: Place 1 application rectally 2 (two) times daily as needed for hemorrhoids.  12/02/15  Yes Orvil Feil, NP  hydroxychloroquine (PLAQUENIL) 200 MG tablet Take 200 mg by mouth daily.   Yes Historical Provider, MD  ibuprofen (ADVIL,MOTRIN) 600 MG tablet Take 1 tablet (600 mg total) by mouth every 8 (eight) hours as needed for pain. 05/08/13  Yes Ernst Spell, MD  levothyroxine (SYNTHROID, LEVOTHROID) 200 MCG tablet TAKE 1 TABLET BY MOUTH EVERY DAY Patient taking differently: TAKE 1 TABLET BY MOUTH EVERY DAY ALONG WITH ONE 25MCG. 05/16/13  Yes Mary-Margaret Hassell Done, FNP  levothyroxine (SYNTHROID, LEVOTHROID) 25 MCG tablet Take 25 mcg by mouth daily before breakfast. Takes along with the 269mcg to equal 285mcg   Yes Historical Provider, MD  lisinopril (PRINIVIL,ZESTRIL) 40 MG tablet Take 40 mg by mouth daily.   Yes Historical Provider, MD  loratadine (CLARITIN) 10 MG tablet Take 10 mg by mouth daily.   Yes Historical Provider, MD  metFORMIN (GLUCOPHAGE) 500 MG tablet Take 1 tablet (500 mg total) by mouth daily with breakfast. Patient taking differently:  Take 1,000 mg by mouth 2 (two) times daily with a meal.  03/07/13  Yes Chipper Herb, MD  methocarbamol (ROBAXIN) 750 MG tablet Take 750 mg by mouth daily as needed (for muscle cramps or pains).   Yes Historical Provider, MD  pramipexole (MIRAPEX) 0.125 MG tablet Take 0.125 mg by mouth daily as needed (restles legs).    Yes Historical Provider, MD  predniSONE (DELTASONE) 5 MG tablet Take 5 mg by mouth daily as needed (lupus).    Yes Historical Provider, MD  vitamin D, CHOLECALCIFEROL, 400 UNITS tablet Take 400 Units by mouth daily.   Yes Historical Provider, MD   Triage Vitals: BP 146/83 mmHg  Pulse 107  Temp(Src) 99.1 F (37.3 C) (Oral)  Resp 22  Ht 5\' 5"  (1.651 m)  Wt 337 lb 4 oz (152.976 kg)  BMI 56.12 kg/m2  SpO2 97%   Physical Exam  Constitutional: She is oriented to person, place, and time. She appears well-developed  and well-nourished. No distress.  HENT:  Head: Normocephalic and atraumatic.  Eyes: EOM are normal.  Neck: Normal range of motion.  Cardiovascular: Normal rate, regular rhythm and normal heart sounds.   Pulmonary/Chest: Effort normal and breath sounds normal.  Abdominal: Soft. She exhibits no distension. There is tenderness.  Tenderness to palpation over epigastrium and RUQ  Musculoskeletal: Normal range of motion.  Neurological: She is alert and oriented to person, place, and time.  Skin: Skin is warm and dry.  Psychiatric: She has a normal mood and affect. Judgment normal.  Nursing note and vitals reviewed.   ED Course  Procedures (including critical care time)  DIAGNOSTIC STUDIES: Oxygen Saturation is 97% on RA, Normal by my interpretation.    COORDINATION OF CARE: 1:34 AM- Will order blood work and urinalysis. Will give Zofran. Discussed treatment plan with pt at bedside and pt agreed to plan.     Labs Review Labs Reviewed  COMPREHENSIVE METABOLIC PANEL - Abnormal; Notable for the following:    Chloride 99 (*)    Glucose, Bld 133 (*)    All other  components within normal limits  CBC - Abnormal; Notable for the following:    WBC 11.2 (*)    RBC 5.34 (*)    Hemoglobin 15.5 (*)    HCT 46.2 (*)    All other components within normal limits  URINALYSIS, ROUTINE W REFLEX MICROSCOPIC (NOT AT [facility]) - Abnormal; Notable for the following:    Color, Urine AMBER (*)    APPearance CLOUDY (*)    Hgb urine dipstick LARGE (*)    Protein, ur 30 (*)    All other components within normal limits  URINE MICROSCOPIC-ADD ON - Abnormal; Notable for the following:    Squamous Epithelial / LPF 0-5 (*)    Bacteria, UA FEW (*)    All other components within normal limits  LIPASE, BLOOD    Imaging Review Ct Renal Stone Study  01/11/2016  CLINICAL DATA:  Right flank pain, abdominal pain, nausea and diarrhea. Hematuria. EXAM: CT ABDOMEN AND PELVIS WITHOUT CONTRAST TECHNIQUE: Multidetector CT imaging of the abdomen and pelvis was performed following the standard protocol without IV contrast. COMPARISON:  CT 12/01/2014 FINDINGS: Lower chest:  The included lung bases are clear. Liver: Hepatic steatosis without focal lesion. Hepatobiliary: Clips in the gallbladder fossa postcholecystectomy. No biliary dilatation. Pancreas: No ductal dilatation or inflammation. Spleen: Normal. Adrenal glands: No nodule. Kidneys: 9 mm stone at the right ureteropelvic junction with mild right hydronephrosis. No significant perinephric stranding. No additional calculus in either kidney. Both ureters are decompressed. Stomach/Bowel: Stomach physiologically distended. There are no dilated or thickened small bowel loops. Small volume of stool throughout the colon without colonic wall thickening. The appendix is normal. Vascular/Lymphatic: No retroperitoneal adenopathy. Abdominal aorta is normal in caliber. Mild atherosclerosis without aneurysm. Reproductive: Post hysterectomy with stable soft tissue prominence at the vaginal cuff. Multilobulated cystic density in the left adnexa is not  significantly changed in size allowing for differences in caliper placement, currently 4.9 x 2.3 cm. On coronal reformats this has a tubular configuration and may reflect hydrosalpinx. Bladder: Decompressed. Other: No free air, free fluid, or intra-abdominal fluid collection. Musculoskeletal: There are no acute or suspicious osseous abnormalities. Prominent Schmorl's node versus minimal compression deformity in the mid lower thoracic spine is unchanged. 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 IMPRESSION: 1. Right-sided stone at the ureteropelvic junction measures 9 mm with mild right hydronephrosis.  2. Chronic findings include cystic density in the left adnexa, hepatic steatosis, and postcholecystectomy. Electronically Signed   By: Jeb Levering M.D.   On: 01/11/2016 02:59   I have personally reviewed and evaluated these images and lab results as part of my medical decision-making.  3:29 AM Patient appears calm, but states that she continues to have severe pain. I reviewed all findings with her, including 9 mm UPJ stone.  Patient will receive additional narcotics, be reassessed.  4:39 AM Following several doses of Dilaudid, the patient continues to complain of severe pain in the lower abdomen.  5:28 AM Patient's condition discussed with our urology colleagues. We agreed, patient will receive additional fluids, Flomax, narcotics, be reassessed.  6:40 AM Patient substantially better, will follow-up with urology in the office. MDM   I personally performed the services described in this documentation, which was scribed in my presence. The recorded information has been reviewed and is accurate.    Patient presents with abdominal pain, nausea. Patient is found to have a 9 mm UVJ stone, hydronephrosis. Patient has mild fever, 99, leukocytosis, but no urinalysis demonstrating substantial infection. However, patient required  substantial pain control, with multiple doses of narcotics.  After she improved, she was discharged in stable condition to follow-up with urology in the office.    Carmin Muskrat, MD 01/11/16 [phone]

## 2016-01-15 ENCOUNTER — Other Ambulatory Visit: Payer: Self-pay | Admitting: Urology

## 2016-01-15 NOTE — Progress Notes (Signed)
Please put orders in EPIC as patient has a Pre-op appointment on Monday 01/18/16 at 1000 a! Thank You! /

## 2016-01-15 NOTE — Patient Instructions (Signed)
[name]  01/15/2016   Your procedure is scheduled on: Monday 01/21/2016  Report to [facility] Main  Entrance take [place]  elevators to 3rd floor to  [place] at 0545 AM.  Call this number if you have problems the morning of surgery [phone]   Remember: ONLY 1 PERSON MAY GO WITH YOU TO SHORT STAY TO GET  READY MORNING OF [place].   Do not eat food or drink liquids :After Midnight.     Take these medicines the morning of surgery with A SIP OF WATER: Amlodipine, Cymbalta, Levothyroxine              DO NOT TAKE ANY DIABETIC MEDICATIONS DAY OF YOUR SURGERY!                               You may not have any metal on your body including hair pins and              piercings  Do not wear jewelry, make-up, lotions, powders or perfumes, deodorant             Do not wear nail polish.  Do not shave  48 hours prior to surgery.              Men may shave face and neck.   Do not bring valuables to the hospital. [employer].  Contacts, dentures or bridgework may not be worn into surgery.  Leave suitcase in the car. After surgery it may be brought to your room.     Patients discharged the day of surgery will not be allowed to drive home.  Name and phone number of your driver:  Special Instructions: N/A              Please read over the following fact sheets you were given: _____________________________________________________________________             [facility] - Preparing for Surgery Before surgery, you can play an important role.  Because skin is not sterile, your skin needs to be as free of germs as possible.  You can reduce the number of germs on your skin by washing with CHG (chlorahexidine gluconate) soap before surgery.  CHG is an antiseptic cleaner which kills germs and bonds with the skin to continue killing germs even after washing. Please DO NOT use if you have an allergy to CHG or  antibacterial soaps.  If your skin becomes reddened/irritated stop using the CHG and inform your nurse when you arrive at Short Stay. Do not shave (including legs and underarms) for at least 48 hours prior to the first CHG shower.  You may shave your face/neck. Please follow these instructions carefully:  1.  Shower with CHG Soap the night before surgery and the  morning of Surgery.  2.  If you choose to wash your hair, wash your hair first as usual with your  normal  shampoo.  3.  After you shampoo, rinse your hair and body thoroughly to remove the  shampoo.                           4.  Use CHG as you would any  other liquid soap.  You can apply chg directly  to the skin and wash                       Gently with a scrungie or clean washcloth.  5.  Apply the CHG Soap to your body ONLY FROM THE NECK DOWN.   Do not use on face/ open                           Wound or open sores. Avoid contact with eyes, ears mouth and genitals (private parts).                       Wash face,  Genitals (private parts) with your normal soap.             6.  Wash thoroughly, paying special attention to the area where your surgery  will be performed.  7.  Thoroughly rinse your body with warm water from the neck down.  8.  DO NOT shower/wash with your normal soap after using and rinsing off  the CHG Soap.                9.  Pat yourself dry with a clean towel.            10.  Wear clean pajamas.            11.  Place clean sheets on your bed the night of your first shower and do not  sleep with pets. Day of Surgery : Do not apply any lotions/deodorants the morning of surgery.  Please wear clean clothes to the hospital/surgery center.  FAILURE TO FOLLOW THESE INSTRUCTIONS MAY RESULT IN THE CANCELLATION OF YOUR SURGERY PATIENT SIGNATURE_________________________________  NURSE SIGNATURE__________________________________  ________________________________________________________________________

## 2016-01-17 NOTE — Progress Notes (Signed)
Quick Note:  Metavir score F3/F4, noting fibrosis and may have underlying cirrhosis. Needs every 6 month ultrasounds, cirrhosis care, consider first EGD for screening in future. Will be seeing [name] April 20th. ______

## 2016-01-18 ENCOUNTER — Encounter (HOSPITAL_COMMUNITY)
Admission: RE | Admit: 2016-01-18 | Discharge: 2016-01-18 | Disposition: A | Discharge status: Home / Self Care | Payer: Medicare Other | Source: Ambulatory Visit | Attending: Urology | Admitting: Urology

## 2016-01-18 ENCOUNTER — Encounter (HOSPITAL_COMMUNITY): Payer: Self-pay

## 2016-01-18 DIAGNOSIS — E119 Type 2 diabetes mellitus without complications: Secondary | ICD-10-CM | POA: Diagnosis not present

## 2016-01-18 DIAGNOSIS — N200 Calculus of kidney: Secondary | ICD-10-CM | POA: Diagnosis not present

## 2016-01-18 DIAGNOSIS — Z7984 Long term (current) use of oral hypoglycemic drugs: Secondary | ICD-10-CM | POA: Diagnosis not present

## 2016-01-18 DIAGNOSIS — Z79899 Other long term (current) drug therapy: Secondary | ICD-10-CM | POA: Diagnosis not present

## 2016-01-18 DIAGNOSIS — E0500 Thyrotoxicosis with diffuse goiter without thyrotoxic crisis or storm: Secondary | ICD-10-CM | POA: Diagnosis not present

## 2016-01-18 DIAGNOSIS — F329 Major depressive disorder, single episode, unspecified: Secondary | ICD-10-CM | POA: Diagnosis not present

## 2016-01-18 DIAGNOSIS — Z7982 Long term (current) use of aspirin: Secondary | ICD-10-CM | POA: Diagnosis not present

## 2016-01-18 DIAGNOSIS — M1990 Unspecified osteoarthritis, unspecified site: Secondary | ICD-10-CM | POA: Diagnosis not present

## 2016-01-18 DIAGNOSIS — Z79891 Long term (current) use of opiate analgesic: Secondary | ICD-10-CM | POA: Diagnosis not present

## 2016-01-18 DIAGNOSIS — E7800 Pure hypercholesterolemia, unspecified: Secondary | ICD-10-CM | POA: Diagnosis not present

## 2016-01-18 DIAGNOSIS — I10 Essential (primary) hypertension: Secondary | ICD-10-CM | POA: Diagnosis not present

## 2016-01-18 DIAGNOSIS — Z6841 Body Mass Index (BMI) 40.0 and over, adult: Secondary | ICD-10-CM | POA: Diagnosis not present

## 2016-01-18 DIAGNOSIS — Z87442 Personal history of urinary calculi: Secondary | ICD-10-CM | POA: Diagnosis not present

## 2016-01-18 DIAGNOSIS — G4730 Sleep apnea, unspecified: Secondary | ICD-10-CM | POA: Diagnosis not present

## 2016-01-18 LAB — CBC
HEMATOCRIT: 42.9 % (ref 36.0–46.0)
HEMOGLOBIN: 14.6 g/dL (ref 12.0–15.0)
MCH: 29 pg (ref 26.0–34.0)
MCHC: 34 g/dL (ref 30.0–36.0)
MCV: 85.3 fL (ref 78.0–100.0)
Platelets: 214 10*3/uL (ref 150–400)
RBC: 5.03 MIL/uL (ref 3.87–5.11)
RDW: 12.6 % (ref 11.5–15.5)
WBC: 8 10*3/uL (ref 4.0–10.5)

## 2016-01-18 LAB — BASIC METABOLIC PANEL
ANION GAP: 9 (ref 5–15)
BUN: 10 mg/dL (ref 6–20)
CO2: 29 mmol/L (ref 22–32)
Calcium: 9.8 mg/dL (ref 8.9–10.3)
Chloride: 104 mmol/L (ref 101–111)
Creatinine, Ser: 0.94 mg/dL (ref 0.44–1.00)
Glucose, Bld: 131 mg/dL — ABNORMAL HIGH (ref 65–99)
POTASSIUM: 3.5 mmol/L (ref 3.5–5.1)
SODIUM: 142 mmol/L (ref 135–145)

## 2016-01-18 NOTE — Progress Notes (Addendum)
12/22/2015-noted EKG in EPIC. 12/01/2015-noted in EPIC in Care everywhere-HgA1C-7.2 01/10/2016-noted in EPIC labs-CBC ,CMET, lipase, U/A and U/A micro

## 2016-01-18 NOTE — Progress Notes (Signed)
   01/18/16 1002  OBSTRUCTIVE SLEEP APNEA  Have you ever been diagnosed with sleep apnea through a sleep study? No  Do you snore loudly (loud enough to be heard through closed doors)?  0  Do you often feel tired, fatigued, or sleepy during the daytime (such as falling asleep during driving or talking to someone)? 1  Has anyone observed you stop breathing during your sleep? 0  Do you have, or are you being treated for high blood pressure? 1  BMI more than 35 kg/m2? 1  Age > 50 (1-yes) 1  Neck circumference greater than:Female 16 inches or larger, Female 17inches or larger? 1  Female Gender (Yes=1) 0  Obstructive Sleep Apnea Score 5  Score 5 or greater  Results sent to PCP

## 2016-01-20 MED ORDER — DEXTROSE 5 % IV SOLN
3.0000 g | INTRAVENOUS | Status: AC
  Administered 2016-01-21: 3 g via INTRAVENOUS
  Filled 2016-01-20 (×2): qty 3000

## 2016-01-21 ENCOUNTER — Encounter (HOSPITAL_COMMUNITY)
Admission: RE | Disposition: A | Discharge status: Home / Self Care | Payer: Self-pay | Source: Ambulatory Visit | Attending: Urology

## 2016-01-21 ENCOUNTER — Ambulatory Visit (HOSPITAL_COMMUNITY)
Admission: RE | Admit: 2016-01-21 | Discharge: 2016-01-21 | Disposition: A | Discharge status: Home / Self Care | Payer: Medicare Other | Source: Ambulatory Visit | Attending: Urology | Admitting: Urology

## 2016-01-21 ENCOUNTER — Ambulatory Visit (HOSPITAL_COMMUNITY): Payer: Medicare Other | Admitting: Certified Registered Nurse Anesthetist

## 2016-01-21 ENCOUNTER — Encounter (HOSPITAL_COMMUNITY): Payer: Self-pay | Admitting: *Deleted

## 2016-01-21 DIAGNOSIS — Z87442 Personal history of urinary calculi: Secondary | ICD-10-CM | POA: Insufficient documentation

## 2016-01-21 DIAGNOSIS — N200 Calculus of kidney: Secondary | ICD-10-CM | POA: Diagnosis not present

## 2016-01-21 DIAGNOSIS — M1990 Unspecified osteoarthritis, unspecified site: Secondary | ICD-10-CM | POA: Insufficient documentation

## 2016-01-21 DIAGNOSIS — E7800 Pure hypercholesterolemia, unspecified: Secondary | ICD-10-CM | POA: Insufficient documentation

## 2016-01-21 DIAGNOSIS — Z79891 Long term (current) use of opiate analgesic: Secondary | ICD-10-CM | POA: Insufficient documentation

## 2016-01-21 DIAGNOSIS — E119 Type 2 diabetes mellitus without complications: Secondary | ICD-10-CM | POA: Diagnosis not present

## 2016-01-21 DIAGNOSIS — G4730 Sleep apnea, unspecified: Secondary | ICD-10-CM | POA: Insufficient documentation

## 2016-01-21 DIAGNOSIS — Z6841 Body Mass Index (BMI) 40.0 and over, adult: Secondary | ICD-10-CM | POA: Insufficient documentation

## 2016-01-21 DIAGNOSIS — Z7984 Long term (current) use of oral hypoglycemic drugs: Secondary | ICD-10-CM | POA: Insufficient documentation

## 2016-01-21 DIAGNOSIS — Z79899 Other long term (current) drug therapy: Secondary | ICD-10-CM | POA: Insufficient documentation

## 2016-01-21 DIAGNOSIS — F329 Major depressive disorder, single episode, unspecified: Secondary | ICD-10-CM | POA: Insufficient documentation

## 2016-01-21 DIAGNOSIS — I10 Essential (primary) hypertension: Secondary | ICD-10-CM | POA: Insufficient documentation

## 2016-01-21 DIAGNOSIS — Z7982 Long term (current) use of aspirin: Secondary | ICD-10-CM | POA: Insufficient documentation

## 2016-01-21 DIAGNOSIS — E0500 Thyrotoxicosis with diffuse goiter without thyrotoxic crisis or storm: Secondary | ICD-10-CM | POA: Insufficient documentation

## 2016-01-21 HISTORY — PX: CYSTOSCOPY WITH RETROGRADE PYELOGRAM, URETEROSCOPY AND STENT PLACEMENT: SHX5789

## 2016-01-21 LAB — GLUCOSE, CAPILLARY
Glucose-Capillary: 108 mg/dL — ABNORMAL HIGH (ref 65–99)
Glucose-Capillary: 180 mg/dL — ABNORMAL HIGH (ref 65–99)

## 2016-01-21 SURGERY — CYSTOURETEROSCOPY, WITH RETROGRADE PYELOGRAM AND STENT INSERTION
Anesthesia: General | Laterality: Right

## 2016-01-21 MED ORDER — PROPOFOL 10 MG/ML IV BOLUS
INTRAVENOUS | Status: DC | PRN
  Administered 2016-01-21: 200 mg via INTRAVENOUS
  Administered 2016-01-21: 50 mg via INTRAVENOUS

## 2016-01-21 MED ORDER — MIDAZOLAM HCL 2 MG/2ML IJ SOLN
INTRAMUSCULAR | Status: AC
  Filled 2016-01-21: qty 2

## 2016-01-21 MED ORDER — KETOROLAC TROMETHAMINE 30 MG/ML IJ SOLN
30.0000 mg | Freq: Once | INTRAMUSCULAR | Status: AC | PRN
  Administered 2016-01-21: 30 mg via INTRAVENOUS

## 2016-01-21 MED ORDER — PHENAZOPYRIDINE HCL 200 MG PO TABS: 200.0000 mg | ORAL_TABLET | Freq: Three times a day (TID) | ORAL | Status: DC | PRN

## 2016-01-21 MED ORDER — FENTANYL CITRATE (PF) 100 MCG/2ML IJ SOLN
INTRAMUSCULAR | Status: AC
  Filled 2016-01-21: qty 2

## 2016-01-21 MED ORDER — LIDOCAINE HCL (CARDIAC) 20 MG/ML IV SOLN
INTRAVENOUS | Status: DC | PRN
  Administered 2016-01-21: 50 mg via INTRAVENOUS

## 2016-01-21 MED ORDER — FENTANYL CITRATE (PF) 100 MCG/2ML IJ SOLN
25.0000 ug | INTRAMUSCULAR | Status: DC | PRN
  Administered 2016-01-21 (×2): 50 ug via INTRAVENOUS

## 2016-01-21 MED ORDER — MIDAZOLAM HCL 5 MG/5ML IJ SOLN
INTRAMUSCULAR | Status: DC | PRN
  Administered 2016-01-21: 2 mg via INTRAVENOUS

## 2016-01-21 MED ORDER — PROMETHAZINE HCL 25 MG/ML IJ SOLN: 6.2500 mg | INTRAMUSCULAR | Status: DC | PRN

## 2016-01-21 MED ORDER — SUCCINYLCHOLINE CHLORIDE 20 MG/ML IJ SOLN
INTRAMUSCULAR | Status: DC | PRN
  Administered 2016-01-21: 160 mg via INTRAVENOUS

## 2016-01-21 MED ORDER — TROSPIUM CHLORIDE ER 60 MG PO CP24: 60.0000 mg | ORAL_CAPSULE | Freq: Every day | ORAL | Status: DC

## 2016-01-21 MED ORDER — EPHEDRINE SULFATE 50 MG/ML IJ SOLN
INTRAMUSCULAR | Status: DC | PRN
  Administered 2016-01-21: 10 mg via INTRAVENOUS

## 2016-01-21 MED ORDER — KETOROLAC TROMETHAMINE 30 MG/ML IJ SOLN
INTRAMUSCULAR | Status: AC
  Filled 2016-01-21: qty 1

## 2016-01-21 MED ORDER — LACTATED RINGERS IV SOLN
INTRAVENOUS | Status: DC | PRN
  Administered 2016-01-21 (×2): via INTRAVENOUS

## 2016-01-21 MED ORDER — CIPROFLOXACIN HCL 500 MG PO TABS: 500.0000 mg | ORAL_TABLET | Freq: Once | ORAL | Status: DC

## 2016-01-21 MED ORDER — ONDANSETRON HCL 4 MG/2ML IJ SOLN
INTRAMUSCULAR | Status: DC | PRN
  Administered 2016-01-21: 4 mg via INTRAVENOUS

## 2016-01-21 MED ORDER — SODIUM CHLORIDE 0.9 % IR SOLN
Status: DC | PRN
  Administered 2016-01-21: 3000 mL
  Administered 2016-01-21: 4000 mL

## 2016-01-21 MED ORDER — DEXAMETHASONE SODIUM PHOSPHATE 10 MG/ML IJ SOLN
INTRAMUSCULAR | Status: DC | PRN
  Administered 2016-01-21: 10 mg via INTRAVENOUS

## 2016-01-21 MED ORDER — HYDROMORPHONE HCL 1 MG/ML IJ SOLN
INTRAMUSCULAR | Status: AC
  Filled 2016-01-21: qty 1

## 2016-01-21 MED ORDER — FENTANYL CITRATE (PF) 100 MCG/2ML IJ SOLN
INTRAMUSCULAR | Status: DC | PRN
  Administered 2016-01-21: 100 ug via INTRAVENOUS

## 2016-01-21 MED ORDER — METOCLOPRAMIDE HCL 5 MG/ML IJ SOLN
INTRAMUSCULAR | Status: AC
  Filled 2016-01-21: qty 2

## 2016-01-21 MED ORDER — PROPOFOL 10 MG/ML IV BOLUS
INTRAVENOUS | Status: AC
  Filled 2016-01-21: qty 40

## 2016-01-21 MED ORDER — HYDROMORPHONE HCL 1 MG/ML IJ SOLN
0.2500 mg | INTRAMUSCULAR | Status: DC | PRN
  Administered 2016-01-21 (×2): 0.5 mg via INTRAVENOUS

## 2016-01-21 MED ORDER — METOCLOPRAMIDE HCL 5 MG/ML IJ SOLN
10.0000 mg | Freq: Once | INTRAMUSCULAR | Status: AC
  Administered 2016-01-21: 10 mg via INTRAVENOUS

## 2016-01-21 SURGICAL SUPPLY — 21 items
BAG URO CATCHER STRL LF (MISCELLANEOUS) ×3 IMPLANT
BASKET DAKOTA 1.9FR 11X120 (BASKET) IMPLANT
BASKET ZERO TIP NITINOL 2.4FR (BASKET) IMPLANT
BSKT STON RTRVL ZERO TP 2.4FR (BASKET)
CATH URET 5FR 28IN OPEN ENDED (CATHETERS) ×3 IMPLANT
CLOTH BEACON ORANGE TIMEOUT ST (SAFETY) ×3 IMPLANT
FIBER LASER FLEXIVA 200 (UROLOGICAL SUPPLIES) ×2 IMPLANT
FIBER LASER TRAC TIP (UROLOGICAL SUPPLIES) ×2 IMPLANT
GLOVE BIOGEL M STRL SZ7.5 (GLOVE) ×3 IMPLANT
GOWN STRL REUS W/TWL XL LVL3 (GOWN DISPOSABLE) ×3 IMPLANT
GUIDEWIRE ANG ZIPWIRE 038X150 (WIRE) IMPLANT
GUIDEWIRE STR DUAL SENSOR (WIRE) ×3 IMPLANT
KIT BALLIN UROMAX 15FX10 (LABEL) IMPLANT
MANIFOLD NEPTUNE II (INSTRUMENTS) ×3 IMPLANT
PACK CYSTO (CUSTOM PROCEDURE TRAY) ×3 IMPLANT
SET HIGH PRES BAL DIL (LABEL) ×2
SHEATH ACCESS URETERAL 38CM (SHEATH) ×2 IMPLANT
STENT URET 6FRX24 CONTOUR (STENTS) ×2 IMPLANT
TUBING CONNECTING 10 (TUBING) ×2 IMPLANT
TUBING CONNECTING 10' (TUBING) ×1
WIRE COONS/BENSON .038X145CM (WIRE) ×2 IMPLANT

## 2016-01-21 NOTE — Transfer of Care (Signed)
Immediate Anesthesia Transfer of Care Note  Patient: [name]  Procedure(s) Performed: Procedure(s): RIGHT RETROGRADE, PYLEGRAM, RIGHT URETEROSCOPY LITHOTRIPSY WITH STONE  BASKET EXTRACTION, STENT PLACEMENT (Right)  Patient Location: PACU  Anesthesia Type:General  Level of Consciousness: awake, alert  and oriented  Airway & Oxygen Therapy: Patient Spontanous Breathing and Patient connected to face mask oxygen  Post-op Assessment: Report given to RN and Post -op Vital signs reviewed and stable  Post vital signs: Reviewed and stable  Last Vitals:  Filed Vitals:   01/21/16 0552  BP: 154/77  Pulse: 74  Temp: 36.7 C  Resp: 18    Complications: No apparent anesthesia complications

## 2016-01-21 NOTE — Op Note (Signed)
Preoperative diagnosis:  1. Right ureteral stone   Postoperative diagnosis:  1. same   Procedure: 1. Cystoscopy, right retrograde pyelogram w/ interpretation 2. Right ureteral dilation 3. Right ureteroscopy, laser lithotripsy and stone extraction 4. Right ureteral stent placement  Surgeon: Ardis Hughs, MD  Anesthesia: General  Complications: None  Intraoperative findings:  #1. Retrograde pyelogram demonstrated narrow caliber ureter up to the renal pelvis, small filling defect in the renal pelvis consistant with the patient's known stone which was pushed back into the kidney. #2.  Proximal ureteral partial disruption from balloon dilation.  EBL: Minimal  Specimens: stone  Indication: [name] is a 55 y.o. patient with right UPJ stone and associated renal colic.  After reviewing the management options for treatment, he elected to proceed with the above surgical procedure(s). We have discussed the potential benefits and risks of the procedure, side effects of the proposed treatment, the likelihood of the patient achieving the goals of the procedure, and any potential problems that might occur during the procedure or recuperation. Informed consent has been obtained.  Description of procedure:  The patient was taken to the operating room and general anesthesia was induced.  The patient was placed in the dorsal lithotomy position, prepped and draped in the usual sterile fashion, and preoperative antibiotics were administered. A preoperative time-out was performed.   Cystourethroscopy was performed.  The patient's urethra was examined and was normal. The bladder was then systematically examined in its entirety. There was no evidence for any bladder tumors, stones, or other mucosal pathology.    Attention then tuned to the right ureteral orifice and a ureteral catheter was used to intubate the ureteral orifice.  Omnipaque contrast was injected through the ureteral catheter and  a retrograde pyelogram was performed with findings as dictated above.  A 0.38 sensor guidewire was then advanced up the right ureter into the renal pelvis under fluoroscopic guidance.  The 6 Fr semirigid ureteroscope was then advanced into the ureter next to the guidewire using a second wire through the scope and "railroading" the scope into the ureteral orifice.  The ureter was normal appearing, although tight.  I then backed out the ureteroscope leaving the wire in the renal pelvis.  I then used a 10cm x 15 F balloon dilator to dilate the UVJ and distal ureter.  I then passed a 12/21F ureteral access sheath into the ureter over the second wire.  The inner portion was removed.  I then advanced the flexible ureteral scope into the sheath and proximal ureter, but was unable to advance into the proximal ureter with gentle pressure.  I then re-introduced the wire into the scope and exchanged the scope for a balloon dilator.  I then dilated the proximal ureter and was able to advance the access sheath more proximal.  The proximal dilation did create some ureteral trauma for ~2cm but I was still able to navigate beyond this and into the kidney.    The stone was located in the renal pelvis and pushed into the upper pole calyx.  The stone was then fragmented with the 200 micron holmium laser fiber on a setting of 0.6 and frequency of 6 Hz.   All stones were then removed from the ureter with an [place] nitinol basket.  Reinspection of the kidney and ureter revealed no remaining visible stones or fragments.   The wire was then backloaded through the cystoscope and a ureteral stent was advance over the wire using Seldinger technique.  The stent  was positioned appropriately under fluoroscopic and cystoscopic guidance.  The wire was then removed with an adequate stent curl noted in the renal pelvis as well as in the bladder.  The bladder was then emptied and the procedure ended.  The patient appeared to tolerate  the procedure well and without complications.  The patient was able to be awakened and transferred to the recovery unit in satisfactory condition.   Disposition: The patient will be scheduled for stent removal in 14 days in our clinic.     Ardis Hughs, M.D.

## 2016-01-21 NOTE — Discharge Instructions (Signed)
DISCHARGE INSTRUCTIONS FOR KIDNEY STONE/URETERAL STENT   MEDICATIONS:  1.  Resume all your other meds from home - except do not take any extra narcotic pain meds that you may have at home.  2. Trospium is to prevent bladder spasms and help reduce urinary frequency. 3. Pyridium is to help with the burning/stinging when you urinate. 4. Take Vicodin, which was previously prescribed, as needed for severe pain, otherwise Tylenol or Ibuprofen is best for mild/moderate pain.  ACTIVITY:  1. No strenuous activity x 1week  2. No driving while on narcotic pain medications  3. Drink plenty of water  4. Continue to walk at home - you can still get blood clots when you are at home, so keep active, but don't over do it.  5. May return to work/school tomorrow or when you feel ready   BATHING:  1. You can shower and we recommend daily showers  2. You have a string coming from your urethra: The stent string is attached to your ureteral stent. Do not pull on this.   SIGNS/SYMPTOMS TO CALL:  Please call us if you have a fever greater than 101.5, uncontrolled nausea/vomiting, uncontrolled pain, dizziness, unable to urinate, bloody urine, chest pain, shortness of breath, leg swelling, leg pain, redness around wound, drainage from wound, or any other concerns or questions.   You can reach [place] at [phone].   FOLLOW-UP:  1. You have an appointment for stent removal in 2 weeks                                                                                                                                                General Anesthesia, Adult, Care After Refer to this sheet in the next few weeks. These instructions provide you with information on caring for yourself after your procedure. Your health care provider may also give you more specific instructions. Your treatment has been planned according to current medical practices, but problems sometimes occur. Call your health care provider if you have any  problems or questions after your procedure. WHAT TO EXPECT AFTER THE PROCEDURE After the procedure, it is typical to experience:  Sleepiness.  Nausea and vomiting. HOME CARE INSTRUCTIONS  For the first 24 hours after general anesthesia:  Have a responsible person with you.  Do not drive a car. If you are alone, do not take public transportation.  Do not drink alcohol.  Do not take medicine that has not been prescribed by your health care provider.  Do not sign important papers or make important decisions.  You may resume a normal diet and activities as directed by your health care provider.  Change bandages (dressings) as directed.  If you have questions or problems that seem related to general anesthesia, call the hospital and ask for the anesthetist or anesthesiologist on call. SEEK MEDICAL CARE IF:  You have  nausea and vomiting that continue the day after anesthesia.  You develop a rash. SEEK IMMEDIATE MEDICAL CARE IF:   You have difficulty breathing.  You have chest pain.  You have any allergic problems.   This information is not intended to replace advice given to you by your health care provider. Make sure you discuss any questions you have with your health care provider.   Document Released: 01/30/2001 Document Revised: 11/14/2014 Document Reviewed: 02/22/2012 Elsevier Interactive Patient Education [employer].

## 2016-01-21 NOTE — Anesthesia Procedure Notes (Signed)
Procedure Name: Intubation Performed by: Gean Maidens Pre-anesthesia Checklist: Patient identified, Emergency Drugs available, Suction available, Patient being monitored and Timeout performed Patient Re-evaluated:Patient Re-evaluated prior to inductionOxygen Delivery Method: Circle system utilized Preoxygenation: Pre-oxygenation with 100% oxygen Intubation Type: IV induction Ventilation: Mask ventilation without difficulty Laryngoscope Size: Glidescope Grade View: Grade II Tube type: Oral Tube size: 7.5 mm Number of attempts: 1 Airway Equipment and Method: Stylet Placement Confirmation: ETT inserted through vocal cords under direct vision,  positive ETCO2,  CO2 detector and breath sounds checked- equal and bilateral Secured at: 22 cm Tube secured with: Tape Dental Injury: Teeth and Oropharynx as per pre-operative assessment

## 2016-01-21 NOTE — Progress Notes (Signed)
Patient states she is a little queezy. Felt that way in PACU but hasn't told anyone.

## 2016-01-21 NOTE — Anesthesia Preprocedure Evaluation (Signed)
Anesthesia Evaluation  Patient identified by MRN, date of birth, ID band Patient awake    Reviewed: Allergy & Precautions, NPO status , Patient's Chart, lab work & pertinent test results  Airway Mallampati: II  TM Distance: <3 FB Neck ROM: Full    Dental no notable dental hx.    Pulmonary neg pulmonary ROS,    breath sounds clear to auscultation + decreased breath sounds      Cardiovascular hypertension, Pt. on medications Normal cardiovascular exam Rhythm:Regular Rate:Normal     Neuro/Psych negative neurological ROS  negative psych ROS   GI/Hepatic negative GI ROS, Neg liver ROS,   Endo/Other  diabetesHypothyroidism Morbid obesity  Renal/GU negative Renal ROS  negative genitourinary   Musculoskeletal negative musculoskeletal ROS (+)   Abdominal (+) + obese,   Peds negative pediatric ROS (+)  Hematology negative hematology ROS (+)   Anesthesia Other Findings   Reproductive/Obstetrics negative OB ROS                             Anesthesia Physical Anesthesia Plan  ASA: III  Anesthesia Plan: General   Post-op Pain Management:    Induction: Intravenous  Airway Management Planned: Oral ETT  Additional Equipment:   Intra-op Plan:   Post-operative Plan: Extubation in OR  Informed Consent: I have reviewed the patients History and Physical, chart, labs and discussed the procedure including the risks, benefits and alternatives for the proposed anesthesia with the patient or authorized representative who has indicated his/her understanding and acceptance.   Dental advisory given  Plan Discussed with: CRNA and Surgeon  Anesthesia Plan Comments:         Anesthesia Quick Evaluation

## 2016-01-21 NOTE — Progress Notes (Signed)
Dr. Kalman Shan made aware of patient's SAO2S

## 2016-01-21 NOTE — Interval H&P Note (Signed)
History and Physical Interval Note:  01/21/2016 7:45 AM  [name]  has presented today for surgery, with the diagnosis of RIGHT UTRETERAL CALCALUS  The various methods of treatment have been discussed with the patient and family. After consideration of risks, benefits and other options for treatment, the patient has consented to  Procedure(s): RIGHT RETROGRADE, PYLEGRAM, RIGHT URETEROSCOPY LITHOTRIPSY WITH STONE  BASKET EXTRACTION, STENT PLACEMENT (Right) as a surgical intervention .  The patient's history has been reviewed, patient examined, no change in status, stable for surgery.  I have reviewed the patient's chart and labs.  Questions were answered to the patient's satisfaction.     Louis Meckel W

## 2016-01-21 NOTE — Progress Notes (Signed)
Dr. Kalman Shan made aware of patient's SA02S- O.K.to go to Short Stay.

## 2016-01-21 NOTE — Anesthesia Postprocedure Evaluation (Signed)
Anesthesia Post Note  Patient: [name]  Procedure(s) Performed: Procedure(s) (LRB): RIGHT RETROGRADE, PYLEGRAM, RIGHT URETEROSCOPY LITHOTRIPSY WITH STONE  BASKET EXTRACTION, STENT PLACEMENT (Right)  Patient location during evaluation: PACU Anesthesia Type: General Level of consciousness: awake and alert Pain management: pain level controlled Vital Signs Assessment: post-procedure vital signs reviewed and stable Respiratory status: spontaneous breathing, nonlabored ventilation, respiratory function stable and patient connected to nasal cannula oxygen Cardiovascular status: blood pressure returned to baseline and stable Postop Assessment: no signs of nausea or vomiting Anesthetic complications: no    Last Vitals:  Filed Vitals:   01/21/16 1200 01/21/16 1215  BP: 140/79 131/83  Pulse: 72 76  Temp: 36.4 C   Resp: 12 8    Last Pain:  Filed Vitals:   01/21/16 1228  PainSc: 0-No pain                 Gearldine Looney S

## 2016-01-21 NOTE — Progress Notes (Signed)
Spoke w Dr Kalman Shan, anesthesia, updated him on patient status  " Ok To DC patient to home"

## 2016-01-21 NOTE — H&P (Signed)
Reason For Visit Hx of kidney stones. Seen in the ED a few days ago for renal colic. CT scan indicated a 59mm right UPJ calculus. This same calculus was identified in the right lower pole of the kidney in 2015 as a 5 mm stone. Her past medical history is significant for lupus and obesity.      History of Present Illness She was prescribed hydrocodone and tamsulosin by the emergency department. She has been managing her symptoms moderately well. No changes in lower urinary tract symptoms however she does state her stream seems somewhat decreased but there is no burning or hematuria. She denies fevers. She is tolerating by mouth intake however she states her appetite is decreased. Review ED notes indicated no suspicion for UTI.    She describes her pain as aching with intermittent sharp episodes. It is epigastric in nature but radiates around to the right flank and right lower back.   Past Medical History Problems  1. History of Graves' Disease 2. History of arthritis (Z87.39) 3. History of depression (Z86.59) 4. History of diabetes mellitus (Z86.39) 5. History of heartburn (Z87.898) 6. History of hypercholesterolemia (Z86.39) 7. History of hypertension (Z86.79) 8. History of hyperthyroidism (Z86.39) 9. History of irritable bowel syndrome (Z87.19) 10. History of sleep apnea (Z86.69) 11. History of systemic lupus erythematosus (SLE) (Z87.39) 12. History of Restless leg (G25.81)  Surgical History Problems  1. History of Cesarean Section 2. History of Cholecystectomy 3. History of Hysterectomy  Current Meds 1. AmLODIPine Besylate 5 MG Oral Tablet;  Therapy: (Recorded:07May2015) to Recorded 2. Aspirin 81 MG TABS;  Therapy: (Recorded:07May2015) to Recorded 3. Atorvastatin Calcium 40 MG Oral Tablet;  Therapy: (Recorded:07May2015) to Recorded 4. Cymbalta 60 MG Oral Capsule Delayed Release Particles;  Therapy: ([id number]) to Recorded 5. Dexilant 60 MG Oral Capsule Delayed  Release;  Therapy: (Recorded:07May2015) to Recorded 6. Flomax 0.4 MG CP24;  Therapy: (Recorded:08Mar2017) to Recorded 7. HydroCHLOROthiazide 25 MG Oral Tablet;  Therapy: (Recorded:07May2015) to Recorded 8. Hydrocodone-Acetaminophen 7.5-500 MG TABS;  Therapy: (Recorded:08Mar2017) to Recorded 9. Hydroxychloroquine Sulfate 200 MG Oral Tablet;  Therapy: ([id number]) to Recorded 10. KS AllerClear 10 MG TABS;   Therapy: (Recorded:07May2015) to Recorded 11. Levothyroxine Sodium 200 MCG Oral Tablet; 225 mcg;   Therapy: (Recorded:08Mar2017) to Recorded 12. Lisinopril 40 MG Oral Tablet;   Therapy: (Recorded:07May2015) to Recorded 13. Loratadine 10 MG Oral Tablet;   Therapy: ([id number]) to Recorded 14. MetFORMIN HCl - 1000 MG Oral Tablet;   Therapy: (Recorded:08Mar2017) to Recorded  Allergies Medication  1. No Known Drug Allergies  Family History Problems  1. Family history of Family Health Status Number Of Children   2 sons 2. Family history of malignant neoplasm of prostate [id number]) : Father 3. Family history of Father Deceased At Age ___ 60. Family history of Mother Deceased At Age ___  Social History Problems  1. Denied: History of Alcohol Use (History) 2. Caffeine Use   2-3 3. Marital History - Divorced 4. Never a smoker 5. Occupation   bus driver 6. Denied: History of Tobacco Use  Review of Systems  Genitourinary: (FOS decreased).  Gastrointestinal: nausea, flank pain and abdominal pain.  Constitutional: feeling poorly (malaise).    Vitals Vital Signs [Data Includes: Last 1 Day]  Recorded: [id number] 10:38AM  Height: 5 ft 3 in Weight: 337 lb  BMI Calculated: 59.7 BSA Calculated: 2.41 Blood Pressure: 128 / 75 Temperature: 97.6 F Heart Rate: 89  Physical Exam Constitutional: Well nourished and well developed . No acute distress.  Pulmonary: No  respiratory distress and normal respiratory rhythm and effort.  Cardiovascular: Heart rate and  rhythm are normal . No peripheral edema.  Abdomen: The abdomen is obese. The abdomen is soft and nontender. No masses are palpated. No CVA tenderness. No hernias are palpable. No hepatosplenomegaly noted.  Skin: Normal skin turgor, no visible rash and no visible skin lesions.  Neuro/Psych:. Mood and affect are appropriate.    Results/Data Urine [Data Includes: Last 1 Day]   [id number]  COLOR YELLOW   APPEARANCE CLEAR   SPECIFIC GRAVITY 1.020   pH 5.5   GLUCOSE NEGATIVE   BILIRUBIN NEGATIVE   KETONE NEGATIVE   BLOOD 3+   PROTEIN TRACE   NITRITE NEGATIVE   LEUKOCYTE ESTERASE NEGATIVE   SQUAMOUS EPITHELIAL/HPF 10-20 HPF  WBC 0-5 WBC/HPF  RBC 20-40 RBC/HPF  BACTERIA FEW HPF  CRYSTALS NONE SEEN HPF  CASTS NONE SEEN LPF  Yeast NONE SEEN HPF   Old records or history reviewed: Recent CT imaging and ED notes.  The following images/tracing/specimen were independently visualized:  KUB: There is a right proximal ureteral calculus overlying to the transverse processes between right L2 and L3. This is correlated with the calculus seen on CT imaging performed a few days ago. No obvious other obstrucive calculi are noted in the renal shadows are expected courses of the ureters. Bowel gas pattern appears normal.  The following clinical lab reports were reviewed:  UA indicates contamination but there is 10-20 rbc/hpf noted.    Assessment Assessed  1. Calculus of right ureter (N20.1)  Plan Calculus of right ureter  1. Follow-up Schedule Surgery Office  Follow-up. I'll talk to Dr Louis Meckel before posting a  green sheet. He may want to do ESWL.  Status: Hold For - Appointment  Requested  for: [id number] Health Maintenance  2. UA With REFLEX; [Do Not Release]; Status:Complete;   DoneLL:[id number] 10:26AM Nephrolithiasis  3. KUB; Status:Complete;   DoneLL:[id number] 11:10AM  Discussion/Summary I explained the risks and benefits of both shockwave lithotripsy and ureteroscopy. I will leave the ultimate  decision up to her urologist. I have sent him a message regarding this. All questions answered to the best of my ability and the patient expresses understanding. She is fairly asymptomatic at today's presentation. Hydration, pain management, and voiding strategies all discussed.

## 2016-01-21 NOTE — Progress Notes (Signed)
Asst patient to BR to void. Tolerated well. State queeziness is almost relieved. Tolerated diet sprite and saltines

## 2016-01-22 ENCOUNTER — Encounter (HOSPITAL_COMMUNITY): Payer: Self-pay | Admitting: Urology

## 2016-02-08 ENCOUNTER — Encounter (HOSPITAL_COMMUNITY): Payer: Self-pay | Admitting: Urology

## 2016-02-25 ENCOUNTER — Ambulatory Visit (INDEPENDENT_AMBULATORY_CARE_PROVIDER_SITE_OTHER): Payer: Medicare Other | Admitting: Gastroenterology

## 2016-02-25 ENCOUNTER — Other Ambulatory Visit: Payer: Self-pay

## 2016-02-25 ENCOUNTER — Encounter: Payer: Self-pay | Admitting: Gastroenterology

## 2016-02-25 VITALS — BP 125/70 | HR 78 | Temp 97.6°F | Ht 65.0 in | Wt 331.8 lb

## 2016-02-25 DIAGNOSIS — N83202 Unspecified ovarian cyst, left side: Secondary | ICD-10-CM | POA: Diagnosis not present

## 2016-02-25 DIAGNOSIS — Z800 Family history of malignant neoplasm of digestive organs: Secondary | ICD-10-CM | POA: Diagnosis not present

## 2016-02-25 DIAGNOSIS — IMO0002 Reserved for concepts with insufficient information to code with codable children: Secondary | ICD-10-CM | POA: Insufficient documentation

## 2016-02-25 DIAGNOSIS — N83209 Unspecified ovarian cyst, unspecified side: Secondary | ICD-10-CM

## 2016-02-25 DIAGNOSIS — K760 Fatty (change of) liver, not elsewhere classified: Secondary | ICD-10-CM

## 2016-02-25 NOTE — Assessment & Plan Note (Signed)
Left ovarian cyst noted on CT last year, with transvaginal US and transabdominal ultrasound unable to characterize or observe due to body habitus. I have referred her to [facility] for further assessment. She tells me she had a complete hysterectomy in the past; however, subsequent CTs have noted partial hysterectomy anatomy. As this is beyond the scope of GI, she is referred to GYN.

## 2016-02-25 NOTE — Progress Notes (Signed)
CC'ED TO PCP 

## 2016-02-25 NOTE — Progress Notes (Signed)
Referring Provider: Elease Etienne Primary Care Physician:  Gearlean Alf., PA-C  Primary GI: Dr. Gala Romney   Chief Complaint  Patient presents with  . Follow-up    Doing well    HPI:   [name] is a 55 y.o. female presenting today with a history of GERD, IBS-D, fatty liver. FH of colon cancer. Colonoscopy completed recently with surveillance due in 5 years. Elastography completed with score F3/F4. No laboratory evidence of chronic liver disease such as thrombocytopenia, hyponatremia, coagulopathy. Historically, ALT has been marginally elevated. Most recent LFTs normal in March 2017. Pelvic ultrasounds completed in interim from last visit due to abnormal CT Jan 2016. This was inconclusive, and she was referred to GYN. She has not been able to see them.   Diarrhea improved. Felt ok "lately'. Abdominal pain resolved. Dexilant for reflux. Controlled well. No GI complaints.   Past Medical History  Diagnosis Date  . Lupus ([place])   . Diabetes mellitus without complication ([place])   . Hypertension   . Kidney stone   . Depression   . GERD (gastroesophageal reflux disease)   . Restless leg   . IBS (irritable bowel syndrome)   . Graves disease     s/p radioactive therapy  . Pancreatitis   . Uvular swelling     pt states it happened after c-section but since then happens randomly at home.  . Hypothyroidism   . Arthritis   . Fibromyalgia   . Hypercholesteremia     Past Surgical History  Procedure Laterality Date  . Cholecystectomy    . Abdominal hysterectomy    . Colonoscopy  2006    RMR: internal/external hemorrhoids, proctitis  . Colonoscopy N/A 02/06/2013    RMR: anal canal hemorrhoids, inadequate prep. Needs screening again in April 2015  . Colonoscopy with propofol N/A 12/24/2015    Dr. Gala Romney: redundant colonic, colonic diverticulosis, cecal AVMs, tubular adenoma, surveillance in 5 years  . Polypectomy  12/24/2015    Procedure: POLYPECTOMY;  Surgeon: Daneil Dolin,  MD;  [facility];  Service: Endoscopy;;  . Cystoscopy with retrograde pyelogram, ureteroscopy and stent placement Right 01/21/2016    Procedure: RIGHT RETROGRADE, PYLEGRAM, RIGHT URETEROSCOPY LITHOTRIPSY WITH STONE  BASKET EXTRACTION, STENT PLACEMENT;  Surgeon: Ardis Hughs, MD;  Location: [facility];  Service: Urology;  Laterality: Right;    Current Outpatient Prescriptions  Medication Sig Dispense Refill  . amLODipine (NORVASC) 5 MG tablet Take 5 mg by mouth daily.    Marland Kitchen aspirin EC 81 MG tablet Take 81 mg by mouth daily.    Marland Kitchen atorvastatin (LIPITOR) 40 MG tablet TAKE 1 TABLET (40 MG TOTAL) BY MOUTH DAILY. 30 tablet 1  . dexlansoprazole (DEXILANT) 60 MG capsule Take 1 capsule (60 mg total) by mouth daily. 90 capsule 3  . dicyclomine (BENTYL) 10 MG capsule Take 1 capsule (10 mg total) by mouth 4 (four) times daily -  before meals and at bedtime. 120 capsule 3  . DULoxetine (CYMBALTA) 60 MG capsule Take 60 mg by mouth daily.     . fluticasone (FLONASE) 50 MCG/ACT nasal spray Place 2 sprays into the nose as needed for rhinitis or allergies.    . hydrochlorothiazide (HYDRODIURIL) 25 MG tablet Take 25 mg by mouth daily.    Marland Kitchen HYDROcodone-ibuprofen (VICOPROFEN) 7.5-200 MG tablet Take 1 tablet by mouth every 6 (six) hours as needed for moderate pain. 20 tablet 0  . hydrocortisone (ANUSOL-HC) 2.5 % rectal cream Place 1 application  rectally 2 (two) times daily. (Patient taking differently: Place 1 application rectally 2 (two) times daily as needed for hemorrhoids. ) 30 g 1  . hydroxychloroquine (PLAQUENIL) 200 MG tablet Take 200 mg by mouth daily.    Marland Kitchen ibuprofen (ADVIL,MOTRIN) 600 MG tablet Take 1 tablet (600 mg total) by mouth every 8 (eight) hours as needed for pain. (Patient taking differently: Take 600 mg by mouth every 8 (eight) hours as needed for moderate pain. ) 30 tablet 0  . levothyroxine (SYNTHROID, LEVOTHROID) 200 MCG tablet TAKE 1 TABLET BY MOUTH EVERY DAY (Patient taking  differently: TAKE 1 TABLET BY MOUTH EVERY DAY ALONG WITH ONE 25MCG = 225 MCG) 30 tablet 4  . levothyroxine (SYNTHROID, LEVOTHROID) 25 MCG tablet Take 25 mcg by mouth daily before breakfast. Takes along with the 261mcg to equal 270mcg    . lisinopril (PRINIVIL,ZESTRIL) 40 MG tablet Take 40 mg by mouth daily.    Marland Kitchen loratadine (CLARITIN) 10 MG tablet Take 10 mg by mouth daily.    . metFORMIN (GLUCOPHAGE) 500 MG tablet Take 1 tablet (500 mg total) by mouth daily with breakfast. (Patient taking differently: Take 1,000 mg by mouth 2 (two) times daily with a meal. ) 30 tablet 1  . pramipexole (MIRAPEX) 0.125 MG tablet Take 0.125 mg by mouth daily as needed (restles legs).     . predniSONE (DELTASONE) 5 MG tablet Take 5 mg by mouth daily as needed (lupus).     . promethazine (PHENERGAN) 25 MG suppository Place 25 mg rectally See admin instructions. Every 4-6 hours as needed for nausea    . vitamin D, CHOLECALCIFEROL, 400 UNITS tablet Take 400 Units by mouth daily.    . vitamin E 400 UNIT capsule Take 400 Units by mouth daily.    . methocarbamol (ROBAXIN) 750 MG tablet Take 750 mg by mouth daily as needed (for muscle cramps or pains). Reported on 02/25/2016    . phenazopyridine (PYRIDIUM) 200 MG tablet Take 1 tablet (200 mg total) by mouth 3 (three) times daily as needed for pain. (Patient not taking: Reported on 02/25/2016) 10 tablet 0  . tamsulosin (FLOMAX) 0.4 MG CAPS capsule Take 1 capsule (0.4 mg total) by mouth daily. (Patient not taking: Reported on 02/25/2016) 15 capsule 0  . Trospium Chloride 60 MG CP24 Take 1 capsule (60 mg total) by mouth daily. (Patient not taking: Reported on 02/25/2016) 7 each 0   No current facility-administered medications for this visit.    Allergies as of 02/25/2016  . (No Known Allergies)    Family History  Problem Relation Age of Onset  . Colon cancer Father     patient was adopted, but she knows birth father. Believes he had colon cancer    Social History    Social History  . Marital Status: Divorced    Spouse Name: N/A  . Number of Children: N/A  . Years of Education: N/A   Social History Main Topics  . Smoking status: Never Smoker   . Smokeless tobacco: None     Comment: Never smoked  . Alcohol Use: No  . Drug Use: No  . Sexual Activity: No   Other Topics Concern  . None   Social History Narrative    Review of Systems: As mentioned in HPI   Physical Exam: BP 125/70 mmHg  Pulse 78  Temp(Src) 97.6 F (36.4 C) (Oral)  Ht 5\' 5"  (1.651 m)  Wt 331 lb 12.8 oz (150.503 kg)  BMI 55.21 kg/m2 General:  Alert and oriented. No distress noted. Pleasant and cooperative.  Head:  Normocephalic and atraumatic. Eyes:  Conjuctiva clear without scleral icterus. Mouth:  Oral mucosa pink and moist. Good dentition. No lesions. Abdomen:  +BS, soft, non-tender and non-distended. No rebound or guarding. Obese, unable to appreciate HSM due to large AP diameter.  Msk:  Symmetrical without gross deformities. Normal posture. Pulses:  2+ DP noted bilaterally Extremities:  Without edema. Neurologic:  Alert and  oriented x4;  grossly normal neurologically. Skin:  Intact without significant lesions or rashes. Cervical Nodes:  No significant cervical adenopathy. Psych:  Alert and cooperative. Normal mood and affect.  Jan 2016 CT abd pelvis with contrast:  IMPRESSION: 1. No acute findings are evident in the abdomen or pelvis 2. Right lower pole renal calculus, slightly enlarged compared to older studies. 3. Multilocular left adnexal cyst. Recommend pelvic ultrasound for Characterization.  Feb 2017 US transvaginal and US pelvis complete:  IMPRESSION: The study is limited by the patient's body habitus. The uterus is surgically absent. The ovaries could not be demonstrated. No cystic or solid adnexal mass was observed.  Lab Results  Component Value Date   WBC 8.0 01/18/2016   HGB 14.6 01/18/2016   HCT 42.9 01/18/2016   MCV 85.3 01/18/2016    PLT 214 01/18/2016   Lab Results  Component Value Date   ALT 45 01/10/2016   AST 35 01/10/2016   ALKPHOS 80 01/10/2016   BILITOT 1.0 01/10/2016   Lab Results  Component Value Date   CREATININE 0.94 01/18/2016   BUN 10 01/18/2016   NA 142 01/18/2016   K 3.5 01/18/2016   CL 104 01/18/2016   CO2 29 01/18/2016

## 2016-02-25 NOTE — Patient Instructions (Signed)
We have referred you to a gynecologist in [place].   We will arrange an ultrasound for you in August and blood work.   Have a great summer!

## 2016-02-25 NOTE — Assessment & Plan Note (Signed)
Most recent LFTs normal. Metavir score F3/F4. Doubt she has cirrhosis. Discussed dietary and behavior modification. Will hold on screening EGD unless worsening fibrosis. [place] every 6 months and elastography in Feb 2018. HFP in Aug 2017.

## 2016-02-25 NOTE — Assessment & Plan Note (Signed)
And personal history of adenomas. Colonoscopy surveillance due in 2022.

## 2016-03-02 ENCOUNTER — Other Ambulatory Visit: Payer: Self-pay | Admitting: Gastroenterology

## 2016-03-02 DIAGNOSIS — K760 Fatty (change of) liver, not elsewhere classified: Secondary | ICD-10-CM

## 2016-04-12 ENCOUNTER — Other Ambulatory Visit: Payer: Self-pay

## 2016-04-12 DIAGNOSIS — N83209 Unspecified ovarian cyst, unspecified side: Secondary | ICD-10-CM

## 2016-05-16 ENCOUNTER — Other Ambulatory Visit: Payer: Self-pay

## 2016-05-16 ENCOUNTER — Telehealth: Payer: Self-pay | Admitting: Internal Medicine

## 2016-05-16 DIAGNOSIS — K760 Fatty (change of) liver, not elsewhere classified: Secondary | ICD-10-CM

## 2016-05-16 NOTE — Telephone Encounter (Signed)
ON RECALL FOR ULTRASOUND  °

## 2016-05-24 ENCOUNTER — Encounter: Payer: Self-pay | Admitting: Obstetrics & Gynecology

## 2016-05-24 ENCOUNTER — Ambulatory Visit (INDEPENDENT_AMBULATORY_CARE_PROVIDER_SITE_OTHER): Payer: Medicare Other | Admitting: Obstetrics & Gynecology

## 2016-05-24 VITALS — Ht 65.0 in | Wt 339.0 lb

## 2016-05-24 DIAGNOSIS — N949 Unspecified condition associated with female genital organs and menstrual cycle: Secondary | ICD-10-CM

## 2016-05-24 DIAGNOSIS — IMO0002 Reserved for concepts with insufficient information to code with codable children: Secondary | ICD-10-CM

## 2016-05-24 DIAGNOSIS — Z90722 Acquired absence of ovaries, bilateral: Secondary | ICD-10-CM | POA: Diagnosis not present

## 2016-05-24 DIAGNOSIS — Z124 Encounter for screening for malignant neoplasm of cervix: Secondary | ICD-10-CM | POA: Diagnosis not present

## 2016-05-24 DIAGNOSIS — Z90710 Acquired absence of both cervix and uterus: Secondary | ICD-10-CM

## 2016-05-24 DIAGNOSIS — Z9079 Acquired absence of other genital organ(s): Secondary | ICD-10-CM

## 2016-05-24 NOTE — Progress Notes (Signed)
CLINIC ENCOUNTER NOTE  History:  55 y.o. PMP F with multiple medical issues s/p supracervical abdominal hysterectomy with bilateral salpingo-oophorectomy in 2005 for AUB, chronic pelvic pain here for evaluation of chronic left adnexal cystic structure that has been seen on several imaging modalities, especially CT scans,  over the last few years. She has chronic RLQ pain associated with nausea and vomiting; has known GERD, IBS-D, and fatty liver for which she is followed by GI.   Of note, recent pelvic ultrasound did not visualize the left ovary or either adnexa.  She is here to discuss results and further management.  She denies any current abnormal vaginal discharge, bleeding, pelvic pain or other concerns.   Past Medical History  Diagnosis Date  . Lupus ([place])   . Diabetes mellitus without complication ([place])   . Hypertension   . Kidney stone   . Depression   . GERD (gastroesophageal reflux disease)   . Restless leg   . IBS (irritable bowel syndrome)   . Graves disease     s/p radioactive therapy  . Pancreatitis   . Uvular swelling     pt states it happened after c-section but since then happens randomly at home.  . Hypothyroidism   . Arthritis   . Fibromyalgia   . Hypercholesteremia   . Sleep apnea     Past Surgical History  Procedure Laterality Date  . Bilateral salpingoophorectomy  2005    [facility] [place] done by Dr. Elonda Husky for AUB, chronic pelvic pain  . Supracervical abdominal hysterectomy  2005  . Colonoscopy  2006    RMR: internal/external hemorrhoids, proctitis  . Colonoscopy N/A 02/06/2013    RMR: anal canal hemorrhoids, inadequate prep. Needs screening again in April 2015  . Colonoscopy with propofol N/A 12/24/2015    Dr. Gala Romney: redundant colonic, colonic diverticulosis, cecal AVMs, tubular adenoma, surveillance in 5 years  . Polypectomy  12/24/2015    Procedure: POLYPECTOMY;  Surgeon: Daneil Dolin, MD;  [facility];  Service: Endoscopy;;  . Cystoscopy with  retrograde pyelogram, ureteroscopy and stent placement Right 01/21/2016    Procedure: RIGHT RETROGRADE, PYLEGRAM, RIGHT URETEROSCOPY LITHOTRIPSY WITH STONE  BASKET EXTRACTION, STENT PLACEMENT;  Surgeon: Ardis Hughs, MD;  Location: [facility];  Service: Urology;  Laterality: Right;  . Laparoscopic cholecystectomy w/ cholangiography  2004   Patient Active Problem List   Diagnosis Date Noted  . Pelvic cyst 02/25/2016  . History of colonic polyps   . Hemorrhoid   . Fatty liver 12/02/2015  . IBS (irritable bowel syndrome) 12/02/2015  . FH: colon cancer 12/02/2015  . Abdominal pain, epigastric 03/14/2013  . Rectal bleeding 02/04/2013  . HYPERTHYROIDISM 07/29/2009  . GERD 07/29/2009  . PROCTITIS 07/29/2009  . FATTY LIVER DISEASE 07/29/2009  . WEIGHT LOSS 07/29/2009  . IRRITABLE BOWEL SYNDROME, HX OF 07/29/2009     The following portions of the patient's history were reviewed and updated as appropriate: allergies, current medications, past family history, past medical history, past social history, past surgical history and problem list.   Health Maintenance: No pap smear for several years.  Normal mammogram on 05/07/2014   Review of Systems:  Pertinent items noted in HPI and remainder of comprehensive ROS otherwise negative.  Objective:  Physical Exam Ht 5\' 5"  (1.651 m)  Wt 339 lb (153.769 kg)  BMI 56.41 kg/m2 CONSTITUTIONAL: Well-developed, well-nourished female in no acute distress.  HENT:  Normocephalic, atraumatic, External right and left ear normal. Oropharynx is clear and moist EYES:  Conjunctivae and EOM are normal. Pupils are equal, round, and reactive to light. No scleral icterus.  NECK: Normal range of motion, supple, no masses.  Normal thyroid.  SKIN: Skin is warm and dry. No rash noted. Not diaphoretic. No erythema. No pallor. NEUROLOGIC: Alert and oriented to person, place, and time. Normal reflexes, muscle tone coordination. No cranial nerve deficit noted. PSYCHIATRIC:  Normal mood and affect. Normal behavior. Normal judgment and thought content. CARDIOVASCULAR: Normal heart rate noted. RESPIRATORY: . Effort and breath sounds normal, no problems with respiration noted. ABDOMEN: Soft, obese, normal bowel sounds, no distention appreciated.   PELVIC: Normal appearing external genitalia; normal appearing vaginal mucosa and cervix with mild atrophy (had to use long [place]).  Normal appearing discharge.  Pap smear obtained.   MUSCULOSKELETAL: Normal range of motion. No tenderness.  No cyanosis, clubbing, or edema.  2+ distal pulses.  Labs and Imaging 01/11/2016 CT ABDOMEN AND PELVIS WITHOUT CONTRAST CLINICAL DATA: Right flank pain, abdominal pain, nausea and diarrhea. Hematuria. COMPARISON: CT 12/01/2014 FINDINGS: Lower chest: The included lung bases are clear. Liver: Hepatic steatosis without focal lesion. Hepatobiliary: Clips in the gallbladder fossa postcholecystectomy. No biliary dilatation. Pancreas: No ductal dilatation or inflammation. Spleen: Normal. Adrenal glands: No nodule. Kidneys: 9 mm stone at the right ureteropelvic junction with mild right hydronephrosis. No significant perinephric stranding. No additional calculus in either kidney. Both ureters are decompressed. Stomach/Bowel: Stomach physiologically distended. There are no dilated or thickened small bowel loops.  Small volume of stool throughout the colon without colonic wall thickening. The appendix is normal. Vascular/Lymphatic: No retroperitoneal adenopathy. Abdominal aorta is normal in caliber. Mild atherosclerosis without aneurysm. Reproductive: Post hysterectomy with stable soft tissue prominence at the vaginal cuff. Multilobulated cystic density in the left adnexa is not significantly changed in size allowing for differences in caliper placement, currently 4.9 x 2.3 cm. On coronal reformats this has a tubular configuration and may reflect hydrosalpinx. Bladder:  Decompressed. Other: No free air, free fluid, or intra-abdominal fluid collection. Musculoskeletal: There are no acute or suspicious osseous abnormalities. Prominent Schmorl's node versus minimal compression deformity in the mid lower thoracic spine is unchanged. 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 IMPRESSION: 1. Right-sided stone at the ureteropelvic junction measures 9 mm with mild right hydronephrosis. 2. Chronic findings include cystic density in the left adnexa, hepatic steatosis, and postcholecystectomy.  12/22/2015 TRANSABDOMINAL AND TRANSVAGINAL ULTRASOUND OF PELVIS CLINICAL DATA: Follow-up of cystic adnexal structure seen on abdominal and pelvic CT scan of December 01, 2014 COMPARISON: Abdominal and pelvic CT scan dated December 01, 2014, Mar 13, 2014, and Mar 08, 2013. FINDINGS: Uterus: The uterus is surgically absent. There is a small amount of fluid in the vaginal cuff. Ovaries: Neither the right nor left ovary could be demonstrated either transabdominal or transvaginally. Other findings: No free pelvic fluid is observed. IMPRESSION: The study is limited by the patient's body habitus. The uterus is surgically absent. The ovaries could not be demonstrated. No cystic or solid adnexal mass was observed.  Assessment & Plan:  1. Pelvic cyst 2. S/P total abdominal hysterectomy and bilateral salpingo-oophorectomy Likely pelvic inclusion cyst formed by residual scar tissue after surgery, to the side of remaining cervical stump.  Patient had both ovaries and tubes totally removed during her hysterectomy in 2005, only has cervix left.  This is a benign post-hysterectomy finding, no surgical intervention or other follow up imaging evaluation is needed. Patient was reassured by this discussion.  3. Pap smear for cervical cancer screening Given that cervix is in place, she still needs cervical cancer screening. This was done  today; will follow up results and manage accordingly. - IGP, Aptima HPV, rfx 16/18,45  Follow up with PCP and other providers for other issues [place] preventative health maintenance measures emphasized. Please refer to After Visit Summary for other counseling recommendations.   Total face-to-face time with patient: 20 minutes. Over 50% of encounter was spent on counseling and coordination of care.   Verita Schneiders, MD, [place] Attending [place], [facility] for [employer], [place]

## 2016-05-24 NOTE — Patient Instructions (Signed)
Return to clinic for any scheduled appointments or for any gynecologic concerns as needed.   

## 2016-05-26 ENCOUNTER — Ambulatory Visit: Payer: Self-pay | Admitting: Certified Nurse Midwife

## 2016-05-26 LAB — IGP, APTIMA HPV, RFX 16/18,45
HPV APTIMA: NEGATIVE
PAP SMEAR COMMENT: 0

## 2016-08-01 IMAGING — US US TRANSVAGINAL NON-OB
1 series · 14 of 21 positions shown · non-contrast
Comparison: Abdominal and pelvic CT scan dated [DATE] [DATE], [DATE],
[DATE] [DATE], [DATE], and March 08, 2013.

CLINICAL DATA: Follow-up of cystic adnexal structure seen on
abdominal and pelvic CT scan December 01, 2014

EXAM:
TRANSABDOMINAL AND TRANSVAGINAL ULTRASOUND OF PELVIS
TECHNIQUE: Both transabdominal and transvaginal ultrasound examinations of the
pelvis were performed. Transabdominal technique was performed for
global imaging of the pelvis including uterus, ovaries, adnexal
regions, and pelvic cul-de-sac. It was necessary to proceed with
endovaginal exam following the transabdominal exam to visualize the
ovaries and adnexal structures.

[Series 1: us transvaginal non-ob · 0.28mm/px · 14 of 21 slices shown]
[im 1/21]
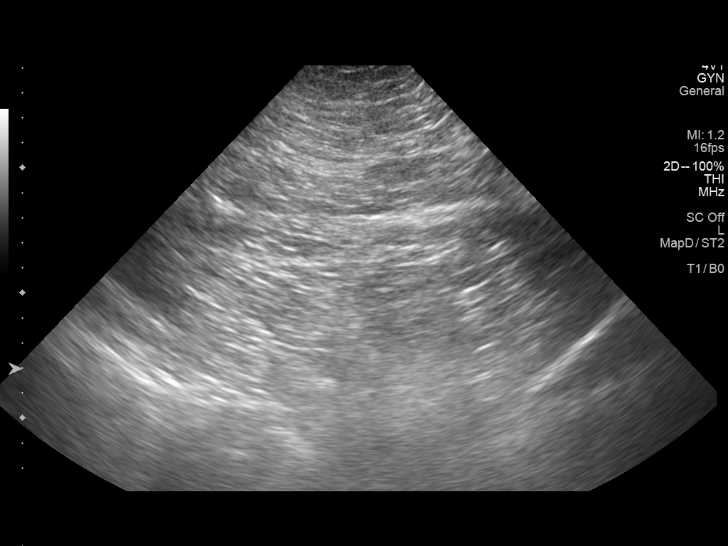
[im 3/21]
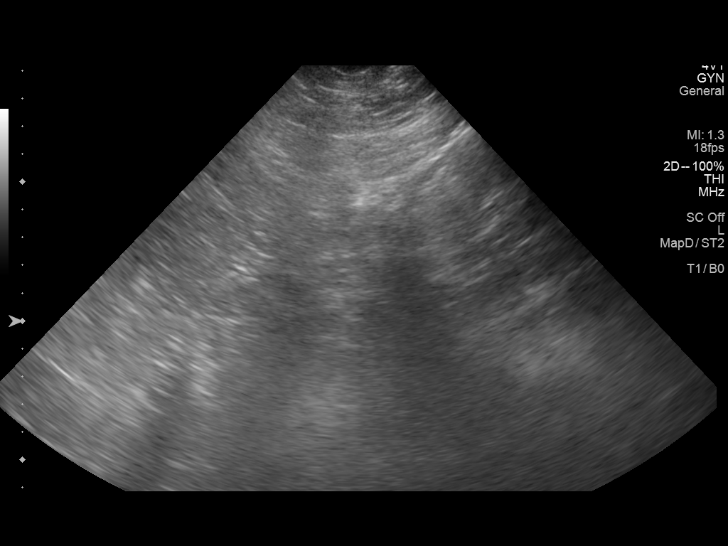
[im 4/21]
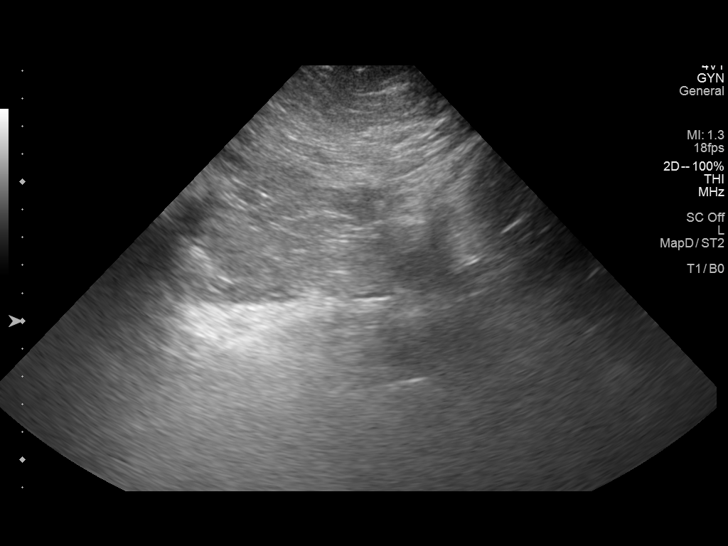
[im 6/21]
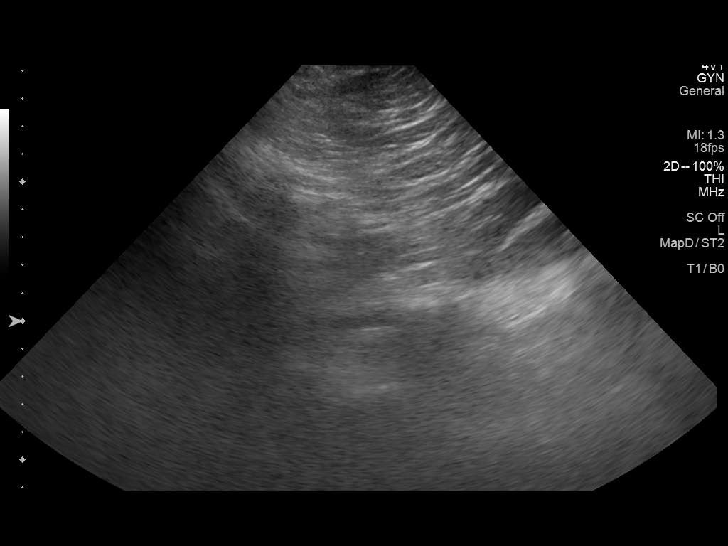
[im 7/21]
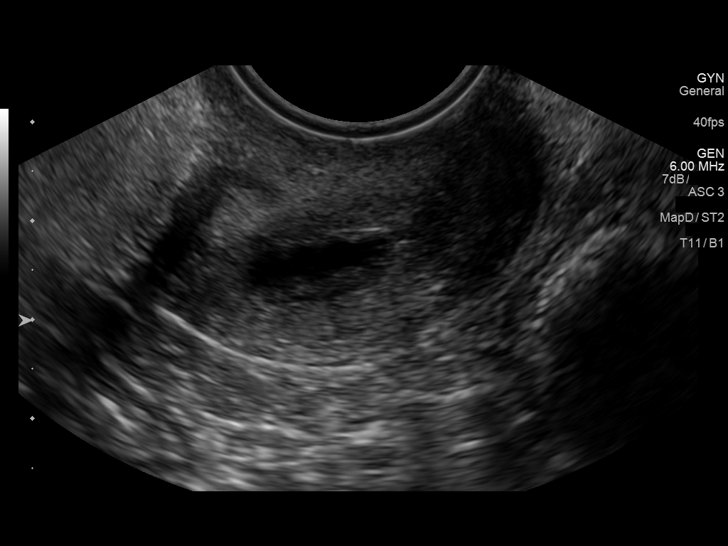
[im 9/21]
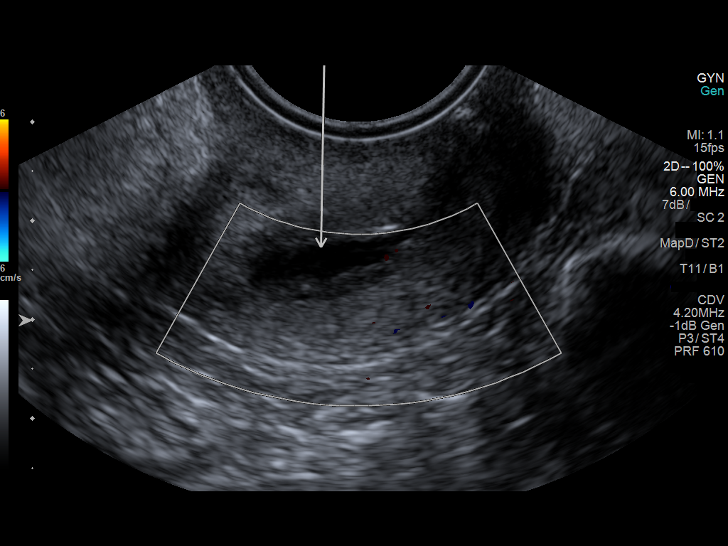
[im 10/21]
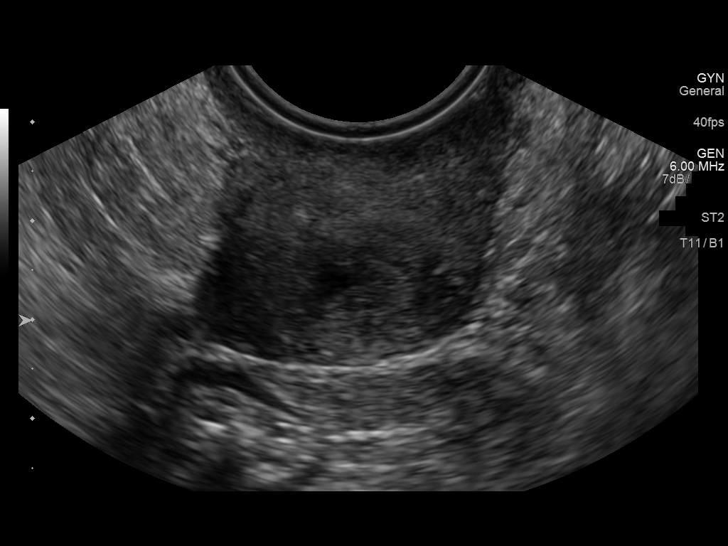
[im 12/21]
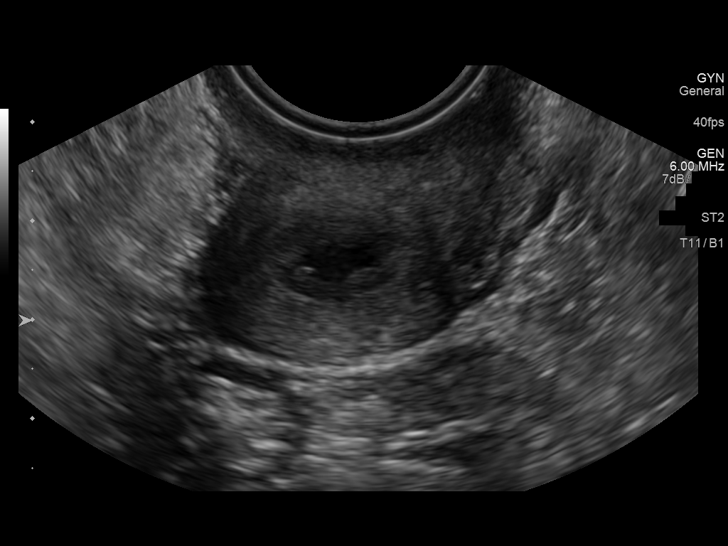
[im 13/21]
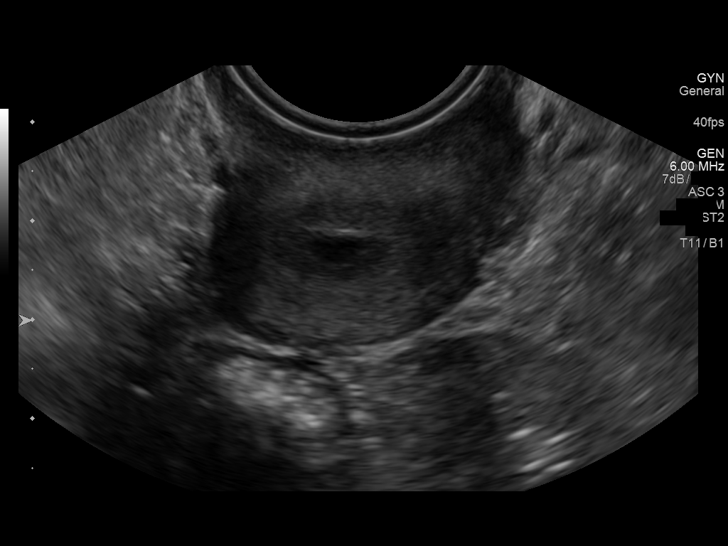
[im 15/21]
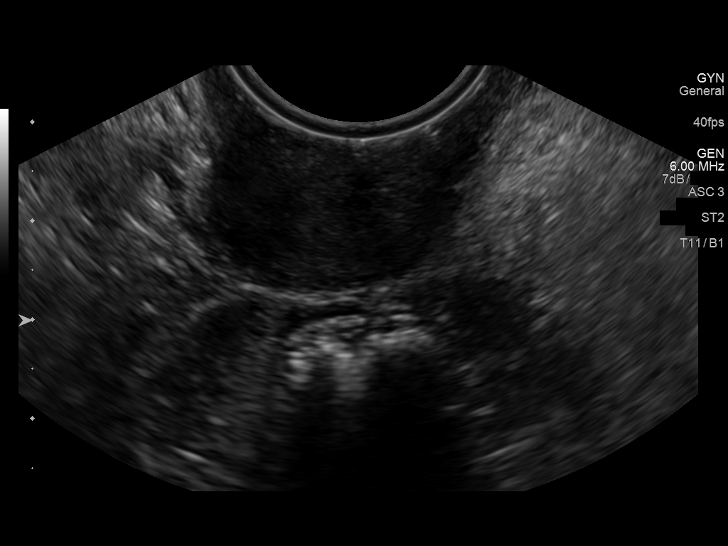
[im 16/21]
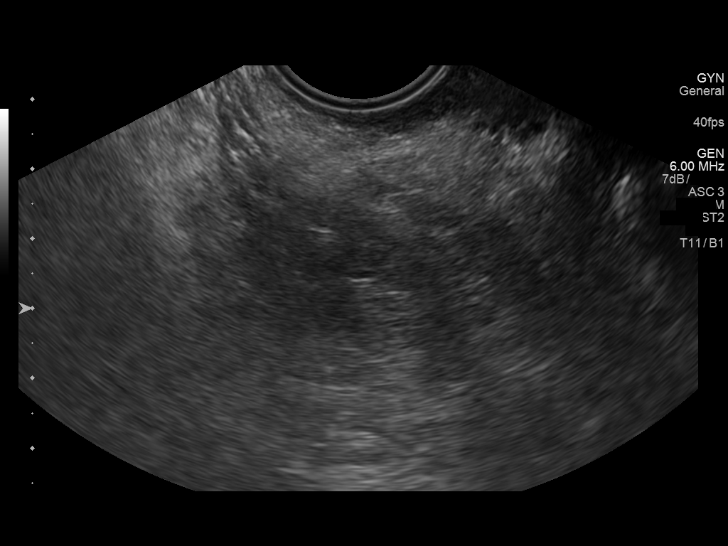
[im 18/21]
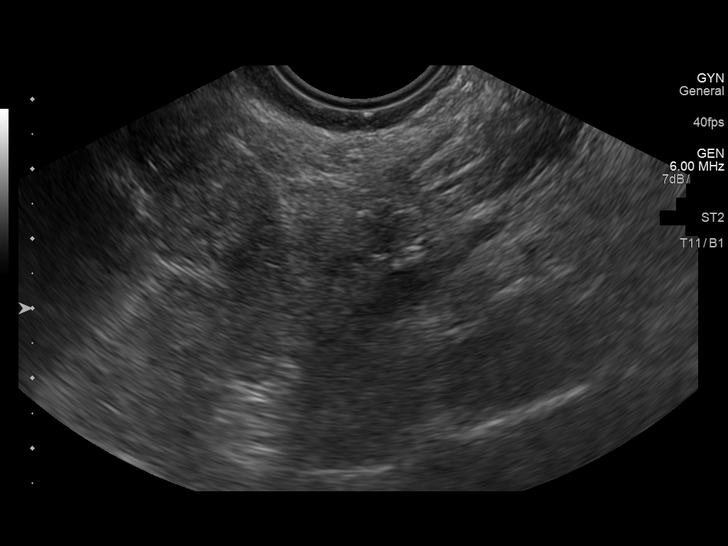
[im 19/21]
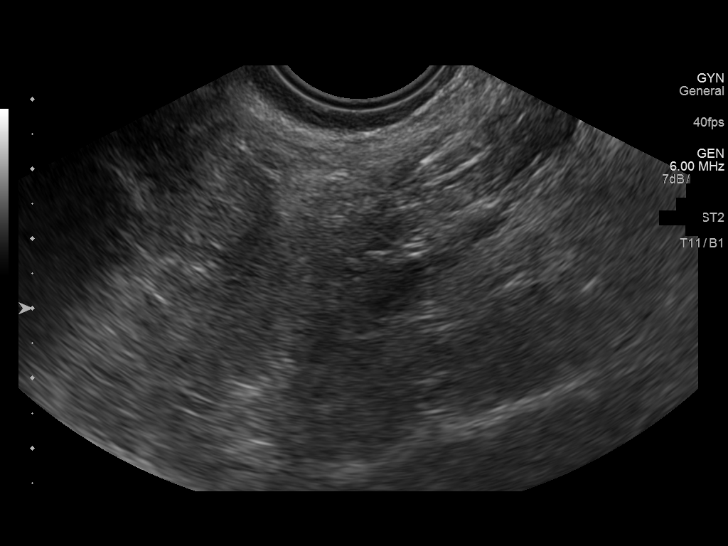
[im 21/21]
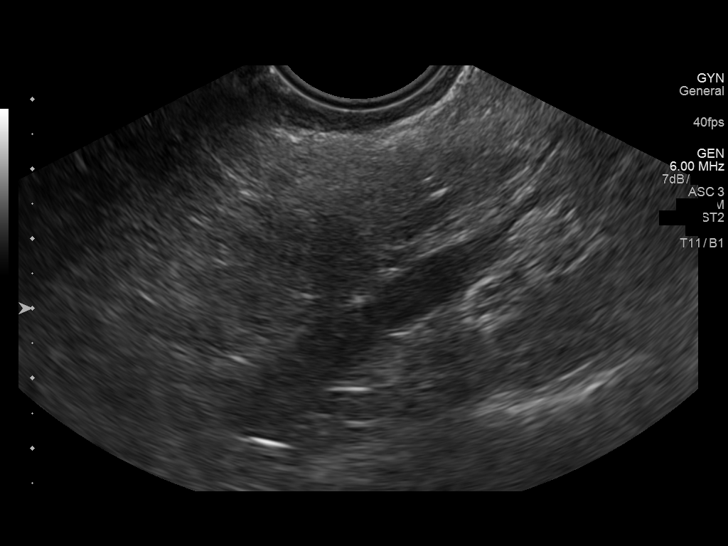

[14 of 21 positions shown; findings below may reference images not displayed]

FINDINGS: Uterus

The uterus is surgically absent. There is a small amount of fluid in
the vaginal cuff.

Ovaries: Neither the right nor left ovary could be demonstrated
either transabdominal a or transvaginally.

Other findings

No free pelvic fluid is observed.
IMPRESSION: The study is limited by the patient's body habitus. The uterus is
surgically absent. The ovaries could not be demonstrated. No cystic
or solid adnexal mass was observed.

## 2016-10-18 ENCOUNTER — Telehealth: Payer: Self-pay | Admitting: Internal Medicine

## 2016-10-18 NOTE — Telephone Encounter (Signed)
Letter mailed

## 2016-10-18 NOTE — Telephone Encounter (Signed)
Recall for elasto 12/2016

## 2017-02-03 ENCOUNTER — Other Ambulatory Visit: Payer: Self-pay | Admitting: Gastroenterology

## 2019-06-14 ENCOUNTER — Encounter (HOSPITAL_COMMUNITY): Payer: Self-pay | Admitting: Emergency Medicine

## 2019-06-14 ENCOUNTER — Ambulatory Visit (HOSPITAL_COMMUNITY)
Admission: EM | Admit: 2019-06-14 | Discharge: 2019-06-14 | Disposition: A | Discharge status: Home / Self Care | Payer: Medicare Other | Attending: Family Medicine | Admitting: Family Medicine

## 2019-06-14 ENCOUNTER — Other Ambulatory Visit: Payer: Self-pay

## 2019-06-14 ENCOUNTER — Ambulatory Visit (INDEPENDENT_AMBULATORY_CARE_PROVIDER_SITE_OTHER): Payer: Medicare Other

## 2019-06-14 DIAGNOSIS — M79604 Pain in right leg: Secondary | ICD-10-CM | POA: Diagnosis not present

## 2019-06-14 DIAGNOSIS — I10 Essential (primary) hypertension: Secondary | ICD-10-CM

## 2019-06-14 MED ORDER — KETOROLAC TROMETHAMINE 30 MG/ML IJ SOLN
INTRAMUSCULAR | Status: AC
  Filled 2019-06-14: qty 1

## 2019-06-14 MED ORDER — PREDNISONE 10 MG (21) PO TBPK: ORAL_TABLET | Freq: Every day | ORAL | 0 refills | Status: DC

## 2019-06-14 MED ORDER — KETOROLAC TROMETHAMINE 30 MG/ML IJ SOLN
30.0000 mg | Freq: Once | INTRAMUSCULAR | Status: AC
  Administered 2019-06-14: 30 mg via INTRAMUSCULAR

## 2019-06-14 NOTE — ED Provider Notes (Signed)
[name]    CSN: [id number] Arrival date & time: 06/14/19  1348      History   Chief Complaint Chief Complaint  Patient presents with  . Leg Pain    HPI [name] is a 58 y.o. female.   [name] presents with complaints of right lower leg pain which has been intermittent for weeks now but much worse over the past week. No known injury or trauma. No redness or swelling, no warmth. Denies any previous similar. Nothing makes it worse or better. Painful even at rest. Has tried taking hydrocodone as well as OTC pain medication which hasn't helped. Denies any previous similar. States she is also experiencing sciatica, with right low back pain which radiates to thigh. However, her lower leg pain started prior to her sciatica flair. History  Of arthritis, depression, DM, gerd, htn, ibs, lupus, pancreatitis, restless leg.     ROS per HPI, negative if not otherwise mentioned.      Past Medical History:  Diagnosis Date  . Arthritis   . Depression   . Diabetes mellitus without complication ([place])   . Fibromyalgia   . GERD (gastroesophageal reflux disease)   . Graves disease    s/p radioactive therapy  . Hypercholesteremia   . Hypertension   . Hypothyroidism   . IBS (irritable bowel syndrome)   . Kidney stone   . Lupus ([place])   . Pancreatitis   . Restless leg   . Sleep apnea   . Uvular swelling    pt states it happened after c-section but since then happens randomly at home.    Patient Active Problem List   Diagnosis Date Noted  . Pelvic cyst 02/25/2016  . History of colonic polyps   . Hemorrhoid   . Fatty liver 12/02/2015  . IBS (irritable bowel syndrome) 12/02/2015  . FH: colon cancer 12/02/2015  . Abdominal pain, epigastric 03/14/2013  . Rectal bleeding 02/04/2013  . HYPERTHYROIDISM 07/29/2009  . GERD 07/29/2009  . PROCTITIS 07/29/2009  . FATTY LIVER DISEASE 07/29/2009  . WEIGHT LOSS 07/29/2009  . IRRITABLE BOWEL SYNDROME, HX OF 07/29/2009     Past Surgical History:  Procedure Laterality Date  . BILATERAL SALPINGOOPHORECTOMY  2005   [facility] done by Dr. Elonda Husky for AUB, chronic pelvic pain  . COLONOSCOPY  2006   RMR: internal/external hemorrhoids, proctitis  . COLONOSCOPY N/A 02/06/2013   RMR: anal canal hemorrhoids, inadequate prep. Needs screening again in April 2015  . COLONOSCOPY WITH PROPOFOL N/A 12/24/2015   Dr. Gala Romney: redundant colonic, colonic diverticulosis, cecal AVMs, tubular adenoma, surveillance in 5 years  . CYSTOSCOPY WITH RETROGRADE PYELOGRAM, URETEROSCOPY AND STENT PLACEMENT Right 01/21/2016   Procedure: RIGHT RETROGRADE, PYLEGRAM, RIGHT URETEROSCOPY LITHOTRIPSY WITH STONE  BASKET EXTRACTION, STENT PLACEMENT;  Surgeon: Ardis Hughs, MD;  Location: [facility];  Service: Urology;  Laterality: Right;  . LAPAROSCOPIC CHOLECYSTECTOMY W/ CHOLANGIOGRAPHY  2004  . POLYPECTOMY  12/24/2015   Procedure: POLYPECTOMY;  Surgeon: Daneil Dolin, MD;  Location: [facility] SUITE;  Service: Endoscopy;;  . SUPRACERVICAL ABDOMINAL HYSTERECTOMY  2005    OB History   No obstetric history on file.      Home Medications    Prior to Admission medications   Medication Sig Start Date End Date Taking? Authorizing Provider  HYDROcodone-ibuprofen (VICOPROFEN) 7.5-200 MG tablet Take 1 tablet by mouth every 6 (six) hours as needed for moderate pain. 01/11/16  Yes Carmin Muskrat, MD  amLODipine (NORVASC) 5 MG tablet  Take 5 mg by mouth daily. Reported on 05/24/2016    [provider]  aspirin EC 81 MG tablet Take 81 mg by mouth daily.    [provider]  atorvastatin (LIPITOR) 40 MG tablet TAKE 1 TABLET (40 MG TOTAL) BY MOUTH DAILY. 03/19/14   Lysbeth Penner, FNP  DEXILANT 60 MG capsule TAKE 1 CAPSULE (60 MG TOTAL) BY MOUTH DAILY. 02/06/17   Annitta Needs, NP  DULoxetine (CYMBALTA) 60 MG capsule Take 60 mg by mouth daily.     [provider]  fluticasone (FLONASE) 50 MCG/ACT nasal spray Place 2 sprays into the nose  as needed for rhinitis or allergies.    [provider]  hydrochlorothiazide (HYDRODIURIL) 25 MG tablet Take 25 mg by mouth daily.    [provider]  hydroxychloroquine (PLAQUENIL) 200 MG tablet Take 200 mg by mouth daily.    [provider]  ibuprofen (ADVIL,MOTRIN) 600 MG tablet Take 1 tablet (600 mg total) by mouth every 8 (eight) hours as needed for pain. Patient taking differently: Take 600 mg by mouth every 8 (eight) hours as needed for moderate pain.  05/08/13   Ernst Spell, MD  levothyroxine (SYNTHROID, LEVOTHROID) 200 MCG tablet TAKE 1 TABLET BY MOUTH EVERY DAY Patient taking differently: TAKE 1 TABLET BY MOUTH EVERY DAY ALONG WITH ONE 25MCG = 225 MCG 05/16/13   Hassell Done, Mary-Margaret, FNP  levothyroxine (SYNTHROID, LEVOTHROID) 25 MCG tablet Take 25 mcg by mouth daily before breakfast. Takes along with the 291mcg to equal 22mcg    [provider]  lisinopril (PRINIVIL,ZESTRIL) 40 MG tablet Take 40 mg by mouth daily.    [provider]  loratadine (CLARITIN) 10 MG tablet Take 10 mg by mouth daily.    [provider]  metFORMIN (GLUCOPHAGE) 500 MG tablet Take 1 tablet (500 mg total) by mouth daily with breakfast. Patient taking differently: Take 1,000 mg by mouth 2 (two) times daily with a meal.  03/07/13   Chipper Herb, MD  methocarbamol (ROBAXIN) 750 MG tablet Take 750 mg by mouth daily as needed (for muscle cramps or pains). Reported on 05/24/2016    [provider]  pramipexole (MIRAPEX) 0.125 MG tablet Take 0.125 mg by mouth daily as needed (restles legs). Reported on 05/24/2016    [provider]  predniSONE (DELTASONE) 5 MG tablet Take 5 mg by mouth daily as needed (lupus). Reported on 05/24/2016    [provider]  predniSONE (STERAPRED UNI-PAK 21 TAB) 10 MG (21) TBPK tablet Take by mouth daily. Per box instruct 06/14/19   Augusto Gamble B, NP  vitamin D, CHOLECALCIFEROL, 400 UNITS tablet Take 400 Units by  mouth daily.    [provider]  vitamin E 400 UNIT capsule Take 400 Units by mouth daily.    [provider]  dicyclomine (BENTYL) 10 MG capsule Take 1 capsule (10 mg total) by mouth 4 (four) times daily -  before meals and at bedtime. Patient not taking: Reported on 05/24/2016 12/02/15 06/14/19  Annitta Needs, NP    Family History Family History  Adopted: Yes  Problem Relation Age of Onset  . Colon cancer Father        patient was adopted, but she knows birth father. Believes he had colon cancer  . Cancer Neg Hx     Social History Social History   Tobacco Use  . Smoking status: Never Smoker  . Tobacco comment: Never smoked  Substance Use Topics  . Alcohol  use: No    Alcohol/week: 0.0 standard drinks  . Drug use: No     Allergies   Patient has no known allergies.   Review of Systems Review of Systems   Physical Exam Triage Vital Signs ED Triage Vitals  Enc Vitals Group     BP 06/14/19 1426 (!) 164/79     Pulse Rate 06/14/19 1426 98     Resp 06/14/19 1426 18     Temp 06/14/19 1426 (!) 97.5 F (36.4 C)     Temp src --      SpO2 06/14/19 1426 97 %     Weight --      Height --      Head Circumference --      Peak Flow --      Pain Score 06/14/19 1427 10     Pain Loc --      Pain Edu? --      Excl. in [place]? --    No data found.  Updated Vital Signs BP (!) 164/79   Pulse 98   Temp (!) 97.5 F (36.4 C)   Resp 18   SpO2 97%   Visual Acuity Right Eye Distance:   Left Eye Distance:   Bilateral Distance:    Right Eye Near:   Left Eye Near:    Bilateral Near:     Physical Exam Constitutional:      General: She is not in acute distress.    Appearance: She is well-developed.     Comments: Patient tearful due to pain; sitting in chair with leg somewhat outstretch, bracing with arms on chair in apparent discomfort  Cardiovascular:     Rate and Rhythm: Normal rate.  Pulmonary:     Effort: Pulmonary effort is normal.  Musculoskeletal:        Legs:     Comments: Generalized pain to right lower leg without specific point tenderness, generally tender to bone as well as soft tissue and musculature; no increased pain with foot dorsiflexion or with straight leg raise; cap refill < 2 seconds to toes; foot sensation intact; dorsalis pedis equal bilaterally and 2+; ambulatory without difficulty; no redness, warmth or swelling; no palpable abnormality   Skin:    General: Skin is warm and dry.  Neurological:     Mental Status: She is alert and oriented to person, place, and time.      UC Treatments / Results  Labs (all labs ordered are listed, but only abnormal results are displayed) Labs Reviewed - No data to display  EKG   Radiology Dg Tibia/fibula Right  Result Date: 06/14/2019 CLINICAL DATA:  Right lower leg pain, no injury EXAM: RIGHT TIBIA AND FIBULA - 2 VIEW COMPARISON:  None. FINDINGS: There is no evidence of fracture or other focal bone lesions. Soft tissues are unremarkable. IMPRESSION: Negative. Electronically Signed   By: Franchot Gallo M.D.   On: 06/14/2019 16:07    Procedures Procedures (including critical care time)  Medications Ordered in UC Medications  ketorolac (TORADOL) 30 MG/ML injection 30 mg (30 mg Intramuscular Given 06/14/19 1544)  ketorolac (TORADOL) 30 MG/ML injection (has no administration in time range)    Initial Impression / Assessment and Plan / UC Course  I have reviewed the triage vital signs and the nursing notes.  Pertinent labs & imaging results that were available during my care of the patient were reviewed by me and considered in my medical decision making (see chart for details).  Xray obtained to rule out potential malignancy? Otherwise exam is benign, although patient in apparent discomfort. She does appear improved and endorses improvement of pain s/p toradol in clinic. Prednisone pack provided. Shin splint stretches recommended. Heat application. Close follow up with PCP if  persistent. Return precautions provided. Patient verbalized understanding and agreeable to plan.  Ambulatory out of clinic without difficulty.    Final Clinical Impressions(s) / UC Diagnoses   Final diagnoses:  Right leg pain     Discharge Instructions     Your xray is normal here today. I am not seeing any clinical evidence of blood clot here today.  Warm compresses, light stretching, activity as tolerated to help with pain.  Steroid dose pack.  Continue to follow up with your pcp for persistent symptoms, if worsening please go to the ER.    ED Prescriptions    Medication Sig Dispense Auth. Provider   predniSONE (STERAPRED UNI-PAK 21 TAB) 10 MG (21) TBPK tablet Take by mouth daily. Per box instruct 21 tablet Zigmund Gottron, NP     Controlled Substance Prescriptions [place] Controlled Substance Registry consulted? Not Applicable   Zigmund Gottron, NP 06/14/19 1651

## 2019-06-14 NOTE — Discharge Instructions (Signed)
Your xray is normal here today. I am not seeing any clinical evidence of blood clot here today.  Warm compresses, light stretching, activity as tolerated to help with pain.  Steroid dose pack.  Continue to follow up with your pcp for persistent symptoms, if worsening please go to the ER.

## 2019-06-14 NOTE — ED Triage Notes (Signed)
Pt c/o R shin pain x1 week, states its progressively getting worse. Ambulatory with steady gait, states walking does not make the pain worse.

## 2020-01-12 ENCOUNTER — Ambulatory Visit: Payer: Medicare Other | Attending: Internal Medicine

## 2020-01-12 DIAGNOSIS — Z23 Encounter for immunization: Secondary | ICD-10-CM

## 2020-01-12 NOTE — Progress Notes (Signed)
Covid-19 Vaccination Clinic  Name:  [name]    MRN: [id number] DOB: 1961/01/21  01/12/2020  [name] was observed post Covid-19 immunization for 30 minutes based on pre-vaccination screening without incident. She was provided with Vaccine Information Sheet and instruction to access the V-Safe system.   [name] was instructed to call 911 with any severe reactions post vaccine: Marland Kitchen Difficulty breathing  . Swelling of face and throat  . A fast heartbeat  . A bad rash all over body  . Dizziness and weakness   Immunizations Administered    Name Date Dose VIS Date Route   Pfizer COVID-19 Vaccine 01/12/2020  8:14 AM 0.3 mL 10/18/2019 Intramuscular   Manufacturer: [employer], [employer]   Lot: [id number]   NDC: [address]-4

## 2020-02-02 ENCOUNTER — Other Ambulatory Visit: Payer: Self-pay

## 2020-02-02 ENCOUNTER — Ambulatory Visit: Payer: Medicare Other | Attending: Internal Medicine

## 2020-02-02 DIAGNOSIS — Z23 Encounter for immunization: Secondary | ICD-10-CM

## 2020-02-02 NOTE — Progress Notes (Signed)
Covid-19 Vaccination Clinic  Name:  [name]    MRN: [id number] DOB: 28-Oct-1961  02/02/2020  [name] was observed post Covid-19 immunization for 30 minutes based on pre-vaccination screening without incident. She was provided with Vaccine Information Sheet and instruction to access the V-Safe system.   [name] was instructed to call 911 with any severe reactions post vaccine: Marland Kitchen Difficulty breathing  . Swelling of face and throat  . A fast heartbeat  . A bad rash all over body  . Dizziness and weakness   Immunizations Administered    Name Date Dose VIS Date Route   Pfizer COVID-19 Vaccine 02/02/2020  8:17 AM 0.3 mL 10/18/2019 Intramuscular   Manufacturer: [employer], [employer]   Lot: [id number]   NDC: [address]-7

## 2020-09-30 ENCOUNTER — Emergency Department (HOSPITAL_COMMUNITY): Payer: Medicare Other

## 2020-09-30 ENCOUNTER — Other Ambulatory Visit: Payer: Self-pay

## 2020-09-30 ENCOUNTER — Encounter (HOSPITAL_COMMUNITY): Payer: Self-pay | Admitting: Emergency Medicine

## 2020-09-30 ENCOUNTER — Emergency Department (HOSPITAL_COMMUNITY)
Admission: EM | Admit: 2020-09-30 | Discharge: 2020-09-30 | Disposition: A | Discharge status: Home / Self Care | Payer: Medicare Other | Attending: Emergency Medicine | Admitting: Emergency Medicine

## 2020-09-30 DIAGNOSIS — E039 Hypothyroidism, unspecified: Secondary | ICD-10-CM | POA: Diagnosis not present

## 2020-09-30 DIAGNOSIS — R109 Unspecified abdominal pain: Secondary | ICD-10-CM | POA: Diagnosis present

## 2020-09-30 DIAGNOSIS — K219 Gastro-esophageal reflux disease without esophagitis: Secondary | ICD-10-CM | POA: Insufficient documentation

## 2020-09-30 DIAGNOSIS — I10 Essential (primary) hypertension: Secondary | ICD-10-CM | POA: Insufficient documentation

## 2020-09-30 DIAGNOSIS — Z7984 Long term (current) use of oral hypoglycemic drugs: Secondary | ICD-10-CM | POA: Insufficient documentation

## 2020-09-30 DIAGNOSIS — R1013 Epigastric pain: Secondary | ICD-10-CM | POA: Diagnosis not present

## 2020-09-30 DIAGNOSIS — R110 Nausea: Secondary | ICD-10-CM | POA: Insufficient documentation

## 2020-09-30 DIAGNOSIS — Z7982 Long term (current) use of aspirin: Secondary | ICD-10-CM | POA: Insufficient documentation

## 2020-09-30 DIAGNOSIS — E119 Type 2 diabetes mellitus without complications: Secondary | ICD-10-CM | POA: Insufficient documentation

## 2020-09-30 DIAGNOSIS — R197 Diarrhea, unspecified: Secondary | ICD-10-CM

## 2020-09-30 DIAGNOSIS — Z79899 Other long term (current) drug therapy: Secondary | ICD-10-CM | POA: Insufficient documentation

## 2020-09-30 DIAGNOSIS — Z87442 Personal history of urinary calculi: Secondary | ICD-10-CM | POA: Insufficient documentation

## 2020-09-30 LAB — URINALYSIS, ROUTINE W REFLEX MICROSCOPIC
Bilirubin Urine: NEGATIVE
Glucose, UA: NEGATIVE mg/dL
Hgb urine dipstick: NEGATIVE
Ketones, ur: NEGATIVE mg/dL
Leukocytes,Ua: NEGATIVE
Nitrite: NEGATIVE
Protein, ur: NEGATIVE mg/dL
Specific Gravity, Urine: 1.023 (ref 1.005–1.030)
pH: 5 (ref 5.0–8.0)

## 2020-09-30 LAB — COMPREHENSIVE METABOLIC PANEL
ALT: 23 U/L (ref 0–44)
AST: 20 U/L (ref 15–41)
Albumin: 4 g/dL (ref 3.5–5.0)
Alkaline Phosphatase: 80 U/L (ref 38–126)
Anion gap: 11 (ref 5–15)
BUN: 10 mg/dL (ref 6–20)
CO2: 27 mmol/L (ref 22–32)
Calcium: 9.2 mg/dL (ref 8.9–10.3)
Chloride: 100 mmol/L (ref 98–111)
Creatinine, Ser: 0.79 mg/dL (ref 0.44–1.00)
GFR, Estimated: >60 mL/min (ref >60)
Glucose, Bld: 149 mg/dL — ABNORMAL HIGH (ref 70–99)
Potassium: 3.6 mmol/L (ref 3.5–5.1)
Sodium: 138 mmol/L (ref 135–145)
Total Bilirubin: 0.7 mg/dL (ref 0.3–1.2)
Total Protein: 6.8 g/dL (ref 6.5–8.1)

## 2020-09-30 LAB — CBC
HCT: 46.8 % — ABNORMAL HIGH (ref 36.0–46.0)
Hemoglobin: 14.9 g/dL (ref 12.0–15.0)
MCH: 29.1 pg (ref 26.0–34.0)
MCHC: 31.8 g/dL (ref 30.0–36.0)
MCV: 91.4 fL (ref 80.0–100.0)
Platelets: 259 10*3/uL (ref 150–400)
RBC: 5.12 MIL/uL — ABNORMAL HIGH (ref 3.87–5.11)
RDW: 12.2 % (ref 11.5–15.5)
WBC: 9.9 10*3/uL (ref 4.0–10.5)
nRBC: 0 % (ref 0.0–0.2)

## 2020-09-30 LAB — LIPASE, BLOOD: Lipase: 37 U/L (ref 11–51)

## 2020-09-30 MED ORDER — LOPERAMIDE HCL 2 MG PO CAPS
4.0000 mg | ORAL_CAPSULE | Freq: Once | ORAL | Status: AC
  Administered 2020-09-30: 4 mg via ORAL
  Filled 2020-09-30: qty 2

## 2020-09-30 MED ORDER — ALUM & MAG HYDROXIDE-SIMETH 200-200-20 MG/5ML PO SUSP
30.0000 mL | Freq: Once | ORAL | Status: AC
  Administered 2020-09-30: 30 mL via ORAL
  Filled 2020-09-30: qty 30

## 2020-09-30 MED ORDER — ONDANSETRON HCL 4 MG/2ML IJ SOLN
4.0000 mg | Freq: Once | INTRAMUSCULAR | Status: AC
  Administered 2020-09-30: 4 mg via INTRAVENOUS
  Filled 2020-09-30: qty 2

## 2020-09-30 MED ORDER — SODIUM CHLORIDE 0.9 % IV BOLUS
1000.0000 mL | Freq: Once | INTRAVENOUS | Status: AC
  Administered 2020-09-30: 1000 mL via INTRAVENOUS

## 2020-09-30 MED ORDER — LOPERAMIDE HCL 2 MG PO CAPS: 2.0000 mg | ORAL_CAPSULE | Freq: Four times a day (QID) | ORAL | 0 refills | Status: DC | PRN

## 2020-09-30 MED ORDER — DICYCLOMINE HCL 10 MG PO CAPS
10.0000 mg | ORAL_CAPSULE | Freq: Once | ORAL | Status: AC
  Administered 2020-09-30: 10 mg via ORAL
  Filled 2020-09-30: qty 1

## 2020-09-30 MED ORDER — IOHEXOL 300 MG/ML  SOLN
100.0000 mL | Freq: Once | INTRAMUSCULAR | Status: AC | PRN
  Administered 2020-09-30: 100 mL via INTRAVENOUS

## 2020-09-30 MED ORDER — DICYCLOMINE HCL 20 MG PO TABS: 20.0000 mg | ORAL_TABLET | Freq: Two times a day (BID) | ORAL | 0 refills | Status: DC

## 2020-09-30 MED ORDER — OMEPRAZOLE 20 MG PO CPDR: 20.0000 mg | DELAYED_RELEASE_CAPSULE | Freq: Every day | ORAL | 0 refills | Status: DC

## 2020-09-30 MED ORDER — MORPHINE SULFATE (PF) 4 MG/ML IV SOLN
4.0000 mg | Freq: Once | INTRAVENOUS | Status: AC
  Administered 2020-09-30: 4 mg via INTRAVENOUS
  Filled 2020-09-30: qty 1

## 2020-09-30 NOTE — Discharge Instructions (Addendum)
You were seen today for nausea, abdominal pain, diarrhea.  Your work-up is reassuring.  You may have some gastritis versus gastroenteritis which is usually related to her viral infection.  Take medications as prescribed.  If no improvement, you should follow-up with your primary doctor and may need referral to GI.

## 2020-09-30 NOTE — ED Provider Notes (Signed)
[facility] EMERGENCY DEPARTMENT Provider Note   CSN: [id number] Arrival date & time: 09/30/20  0056     History Chief Complaint  Patient presents with  . Abdominal Pain    [name] is a 59 y.o. female.  HPI     This a 59 year old female with a history of diabetes, reflux, Graves' disease, hypertension, hypothyroidism, lupus, pancreatitis who presents with abdominal pain and diarrhea.  Patient reports 1 week history of epigastric sharp abdominal pain.  It is nonradiating.  She rates her pain at 8 out of 10.  Nothing seems to make it better or worse.  She states that 2 days ago she began to have "explosive" watery diarrhea.  It was nonbloody.  She has had nausea without vomiting.  No fevers or chills.  No known sick contacts or Covid exposures.  She has a history of pancreatitis but had a cholecystectomy.  She states that she does not remember what that felt like.  Past Medical History:  Diagnosis Date  . Arthritis   . Depression   . Diabetes mellitus without complication ([place])   . Fibromyalgia   . GERD (gastroesophageal reflux disease)   . Graves disease    s/p radioactive therapy  . Hypercholesteremia   . Hypertension   . Hypothyroidism   . IBS (irritable bowel syndrome)   . Kidney stone   . Lupus ([place])   . Pancreatitis   . Restless leg   . Sleep apnea   . Uvular swelling    pt states it happened after c-section but since then happens randomly at home.    Patient Active Problem List   Diagnosis Date Noted  . Pelvic cyst 02/25/2016  . History of colonic polyps   . Hemorrhoid   . Fatty liver 12/02/2015  . IBS (irritable bowel syndrome) 12/02/2015  . FH: colon cancer 12/02/2015  . Abdominal pain, epigastric 03/14/2013  . Rectal bleeding 02/04/2013  . HYPERTHYROIDISM 07/29/2009  . GERD 07/29/2009  . PROCTITIS 07/29/2009  . FATTY LIVER DISEASE 07/29/2009  . WEIGHT LOSS 07/29/2009  . IRRITABLE BOWEL SYNDROME, HX OF 07/29/2009    Past  Surgical History:  Procedure Laterality Date  . BILATERAL SALPINGOOPHORECTOMY  2005   [facility] done by Dr. Elonda Husky for AUB, chronic pelvic pain  . COLONOSCOPY  2006   RMR: internal/external hemorrhoids, proctitis  . COLONOSCOPY N/A 02/06/2013   RMR: anal canal hemorrhoids, inadequate prep. Needs screening again in April 2015  . COLONOSCOPY WITH PROPOFOL N/A 12/24/2015   Dr. Gala Romney: redundant colonic, colonic diverticulosis, cecal AVMs, tubular adenoma, surveillance in 5 years  . CYSTOSCOPY WITH RETROGRADE PYELOGRAM, URETEROSCOPY AND STENT PLACEMENT Right 01/21/2016   Procedure: RIGHT RETROGRADE, PYLEGRAM, RIGHT URETEROSCOPY LITHOTRIPSY WITH STONE  BASKET EXTRACTION, STENT PLACEMENT;  Surgeon: Ardis Hughs, MD;  Location: [facility];  Service: Urology;  Laterality: Right;  . LAPAROSCOPIC CHOLECYSTECTOMY W/ CHOLANGIOGRAPHY  2004  . POLYPECTOMY  12/24/2015   Procedure: POLYPECTOMY;  Surgeon: Daneil Dolin, MD;  Location: [facility] SUITE;  Service: Endoscopy;;  . SUPRACERVICAL ABDOMINAL HYSTERECTOMY  2005     OB History   No obstetric history on file.     Family History  Adopted: Yes  Problem Relation Age of Onset  . Colon cancer Father        patient was adopted, but she knows birth father. Believes he had colon cancer  . Cancer Neg Hx     Social History   Tobacco Use  . Smoking  status: Never Smoker  . Smokeless tobacco: Never Used  . Tobacco comment: Never smoked  Substance Use Topics  . Alcohol use: No    Alcohol/week: 0.0 standard drinks  . Drug use: No    Home Medications Prior to Admission medications   Medication Sig Start Date End Date Taking? Authorizing Provider  amLODipine (NORVASC) 5 MG tablet Take 5 mg by mouth daily. Reported on 05/24/2016   Yes [provider]  aspirin EC 81 MG tablet Take 81 mg by mouth daily.   Yes [provider]  atorvastatin (LIPITOR) 40 MG tablet TAKE 1 TABLET (40 MG TOTAL) BY MOUTH DAILY. Patient taking differently: Take 40  mg by mouth daily.  03/19/14  Yes Lysbeth Penner, FNP  cholecalciferol (VITAMIN D3) 25 MCG (1000 UNIT) tablet Take 1,000 Units by mouth daily.    Yes [provider]  DEXILANT 60 MG capsule TAKE 1 CAPSULE (60 MG TOTAL) BY MOUTH DAILY. 02/06/17  Yes Annitta Needs, NP  DULoxetine (CYMBALTA) 60 MG capsule Take 60 mg by mouth daily.    Yes [provider]  fluticasone (FLONASE) 50 MCG/ACT nasal spray Place 2 sprays into the nose as needed for rhinitis or allergies.   Yes [provider]  hydrochlorothiazide (HYDRODIURIL) 25 MG tablet Take 25 mg by mouth daily.   Yes [provider]  hydroxychloroquine (PLAQUENIL) 200 MG tablet Take 200 mg by mouth daily.   Yes [provider]  JANUVIA 100 MG tablet Take 100 mg by mouth daily. 08/29/20  Yes [provider]  levothyroxine (SYNTHROID) 137 MCG tablet Take 137 mcg by mouth daily before breakfast.    Yes [provider]  lisinopril (PRINIVIL,ZESTRIL) 40 MG tablet Take 40 mg by mouth daily.   Yes [provider]  loratadine (CLARITIN) 10 MG tablet Take 10 mg by mouth daily.   Yes [provider]  metFORMIN (GLUCOPHAGE) 500 MG tablet Take 1 tablet (500 mg total) by mouth daily with breakfast. Patient taking differently: Take 1,000 mg by mouth 2 (two) times daily with a meal.  03/07/13  Yes Chipper Herb, MD  methocarbamol (ROBAXIN) 750 MG tablet Take 750 mg by mouth daily as needed (for muscle cramps or pains). Reported on 05/24/2016   Yes [provider]  pramipexole (MIRAPEX) 0.125 MG tablet Take 0.125 mg by mouth daily as needed (restles legs). Reported on 05/24/2016   Yes [provider]  predniSONE (DELTASONE) 5 MG tablet Take 5 mg by mouth daily as needed (lupus). Reported on 05/24/2016   Yes [provider]  vitamin E 400 UNIT capsule Take 400 Units by mouth daily.   Yes [provider]  dicyclomine (BENTYL) 20 MG tablet Take 1 tablet (20 mg  total) by mouth 2 (two) times daily. 09/30/20   Ndidi Nesby, Barbette Hair, MD  loperamide (IMODIUM) 2 MG capsule Take 1 capsule (2 mg total) by mouth 4 (four) times daily as needed for diarrhea or loose stools. 09/30/20   Kaisy Severino, Barbette Hair, MD  omeprazole (PRILOSEC) 20 MG capsule Take 1 capsule (20 mg total) by mouth daily. 09/30/20   Eeva Schlosser, Barbette Hair, MD    Allergies    Patient has no known allergies.  Review of Systems   Review of Systems  Constitutional: Negative for fever.  Respiratory: Negative for shortness of breath.   Cardiovascular: Negative for chest pain.  Gastrointestinal: Positive for abdominal pain, diarrhea and nausea. Negative for vomiting.  Genitourinary: Negative for dysuria.  All other  systems reviewed and are negative.   Physical Exam Updated Vital Signs BP (!) 141/59   Pulse 71   Temp (!) 97.5 F (36.4 C) (Oral)   Resp 16   Ht 1.676 m (5\' 6" )   Wt (!) 162 kg   SpO2 93%   BMI 57.64 kg/m   Physical Exam Vitals and nursing note reviewed.  Constitutional:      Appearance: She is well-developed. She is obese. She is not ill-appearing.  HENT:     Head: Normocephalic and atraumatic.  Eyes:     Pupils: Pupils are equal, round, and reactive to light.  Cardiovascular:     Rate and Rhythm: Normal rate and regular rhythm.     Heart sounds: Normal heart sounds.  Pulmonary:     Effort: Pulmonary effort is normal. No respiratory distress.     Breath sounds: No wheezing.  Abdominal:     General: Bowel sounds are normal.     Palpations: Abdomen is soft.     Tenderness: There is abdominal tenderness in the epigastric area. There is no guarding or rebound.  Musculoskeletal:     Cervical back: Neck supple.  Skin:    General: Skin is warm and dry.  Neurological:     Mental Status: She is alert and oriented to person, place, and time.  Psychiatric:        Mood and Affect: Mood normal.     ED Results / Procedures / Treatments   Labs (all labs ordered are  listed, but only abnormal results are displayed) Labs Reviewed  COMPREHENSIVE METABOLIC PANEL - Abnormal; Notable for the following components:      Result Value   Glucose, Bld 149 (*)    All other components within normal limits  CBC - Abnormal; Notable for the following components:   RBC 5.12 (*)    HCT 46.8 (*)    All other components within normal limits  URINALYSIS, ROUTINE W REFLEX MICROSCOPIC - Abnormal; Notable for the following components:   Color, Urine AMBER (*)    APPearance HAZY (*)    Bacteria, UA RARE (*)    All other components within normal limits  LIPASE, BLOOD    EKG None  Radiology CT ABDOMEN PELVIS W CONTRAST  Result Date: 09/30/2020 CLINICAL DATA:  Mid abdominal pain since last week. Diarrhea and nausea. EXAM: CT ABDOMEN AND PELVIS WITH CONTRAST TECHNIQUE: Multidetector CT imaging of the abdomen and pelvis was performed using the standard protocol following bolus administration of intravenous contrast. CONTRAST:  166mL OMNIPAQUE IOHEXOL 300 MG/ML  SOLN COMPARISON:  01/11/2016 and 12/01/2014 FINDINGS: Lower chest: The lung bases are clear. Hepatobiliary: No focal liver abnormality is seen. Status post cholecystectomy. No biliary dilatation. Mild diffuse fatty infiltration of the liver. Pancreas: Unremarkable. No pancreatic ductal dilatation or surrounding inflammatory changes. Spleen: Normal in size without focal abnormality. Adrenals/Urinary Tract: Adrenal glands are unremarkable. Kidneys are normal, without renal calculi, focal lesion, or hydronephrosis. Bladder is unremarkable. Stomach/Bowel: Stomach is within normal limits. Appendix appears normal. No evidence of bowel wall thickening, distention, or inflammatory changes. Vascular/Lymphatic: Aortic atherosclerosis. No enlarged abdominal or pelvic lymph nodes. Reproductive: Partial hysterectomy.  No abnormal pelvic masses. Other: No abdominal wall hernia or abnormality. No abdominopelvic ascites. Musculoskeletal: No  acute or significant osseous findings. IMPRESSION: No acute process demonstrated in the abdomen or pelvis. No evidence of bowel obstruction or inflammation. Mild diffuse fatty infiltration of the liver. Aortic Atherosclerosis (ICD10-I70.0). Electronically Signed   By: Oren Beckmann.D.  On: 09/30/2020 05:22    Procedures Procedures (including critical care time)  Medications Ordered in ED Medications  morphine 4 MG/ML injection 4 mg (4 mg Intravenous Given 09/30/20 0326)  ondansetron (ZOFRAN) injection 4 mg (4 mg Intravenous Given 09/30/20 0326)  sodium chloride 0.9 % bolus 1,000 mL (0 mLs Intravenous Stopped 09/30/20 0427)  alum & mag hydroxide-simeth (MAALOX/MYLANTA) 200-200-20 MG/5ML suspension 30 mL (30 mLs Oral Given 09/30/20 0326)  loperamide (IMODIUM) capsule 4 mg (4 mg Oral Given 09/30/20 0327)  dicyclomine (BENTYL) capsule 10 mg (10 mg Oral Given 09/30/20 0326)  morphine 4 MG/ML injection 4 mg (4 mg Intravenous Given 09/30/20 0432)  alum & mag hydroxide-simeth (MAALOX/MYLANTA) 200-200-20 MG/5ML suspension 30 mL (30 mLs Oral Given 09/30/20 0432)  iohexol (OMNIPAQUE) 300 MG/ML solution 100 mL (100 mLs Intravenous Contrast Given 09/30/20 0453)    ED Course  I have reviewed the triage vital signs and the nursing notes.  Pertinent labs & imaging results that were available during my care of the patient were reviewed by me and considered in my medical decision making (see chart for details).  Clinical Course as of Sep 30 536  Wed Sep 30, 2020  0425 On recheck, patient with no improvement of pain with morphine, Zofran, Bentyl, and Imodium.  Reduce pain medication.  Will obtain CT scan.   [CH]    Clinical Course User Index [CH] Claritza July, Barbette Hair, MD   MDM Rules/Calculators/A&P                          Patient presents with abdominal pain.  She is overall nontoxic-appearing on exam and vital signs are largely reassuring.  She has some tenderness in the epigastrium without  signs of peritonitis.  Considerations include but not limited to, reflux, gastroenteritis, pancreatitis.  Less likely appendicitis.  She is status post cholecystectomy.  Patient was given pain and nausea medication as well as Imodium and Bentyl.  Lab work obtained.  No significant leukocytosis.  Lipase is normal.  No significant metabolic derangements.  Urinalysis without evidence of infection.  On recheck, patient reports ongoing pain.  CT scan was obtained to rule out intra-abdominal process.  CT scan is negative.  Symptoms are highly suggestive of gastritis versus gastroenteritis.  Will start on Bentyl, Imodium, and omeprazole as an outpatient.  GI follow-up provided.  After history, exam, and medical workup I feel the patient has been appropriately medically screened and is safe for discharge home. Pertinent diagnoses were discussed with the patient. Patient was given return precautions.  Final Clinical Impression(s) / ED Diagnoses Final diagnoses:  Epigastric pain  Diarrhea, unspecified type    Rx / DC Orders ED Discharge Orders         Ordered    loperamide (IMODIUM) 2 MG capsule  4 times daily PRN        09/30/20 0536    dicyclomine (BENTYL) 20 MG tablet  2 times daily        09/30/20 0536    omeprazole (PRILOSEC) 20 MG capsule  Daily        09/30/20 0536           Merryl Hacker, MD 09/30/20 [phone]

## 2020-09-30 NOTE — ED Triage Notes (Signed)
Patient reports mid abdominal pain onset last week with diarrhea and nausea , denies fever or chills .

## 2020-10-07 ENCOUNTER — Ambulatory Visit (INDEPENDENT_AMBULATORY_CARE_PROVIDER_SITE_OTHER): Payer: Medicare Other | Admitting: Nurse Practitioner

## 2020-10-07 ENCOUNTER — Encounter: Payer: Self-pay | Admitting: Nurse Practitioner

## 2020-10-07 ENCOUNTER — Encounter: Payer: Self-pay | Admitting: *Deleted

## 2020-10-07 ENCOUNTER — Other Ambulatory Visit: Payer: Self-pay

## 2020-10-07 VITALS — BP 150/82 | HR 92 | Temp 96.9°F | Ht 65.0 in | Wt 334.6 lb

## 2020-10-07 DIAGNOSIS — K625 Hemorrhage of anus and rectum: Secondary | ICD-10-CM | POA: Diagnosis not present

## 2020-10-07 DIAGNOSIS — K219 Gastro-esophageal reflux disease without esophagitis: Secondary | ICD-10-CM

## 2020-10-07 DIAGNOSIS — R197 Diarrhea, unspecified: Secondary | ICD-10-CM

## 2020-10-07 DIAGNOSIS — R1013 Epigastric pain: Secondary | ICD-10-CM | POA: Diagnosis not present

## 2020-10-07 MED ORDER — SUCRALFATE 1 G PO TABS: 1.0000 g | ORAL_TABLET | Freq: Four times a day (QID) | ORAL | 3 refills | Status: DC | PRN

## 2020-10-07 NOTE — H&P (View-Only) (Signed)
Primary Care Physician:  Elease Etienne Primary Gastroenterologist:  Dr. Gala Romney  Chief Complaint  Patient presents with  . Abdominal Pain    for couple weeks  . Diarrhea    seen blood once  . Nausea    HPI:   [name] is a 59 y.o. female who presents on referral from emergency department for abdominal pain.  We have not seen the patient since April 2017 when she was seen for family history of colon cancer, fatty liver, cyst of the left ovary.  Previous ultrasound elastography with a Matavir score of F3/F4 but no laboratory evidence of chronic liver disease.  Marginal intermittent ALT elevation but also some normalization as well.  Pelvic ultrasounds and abdominal CT in 2016 that was inconclusive and referred to OB/GYN.  Colonoscopy up-to-date 12/24/2015 which found inadequate preparation, anal canal hemorrhoids, difficult to assess.  Recommended repeat colonoscopy in 1 year.  This is not appear to have happened.  She was seen in the emergency department 09/30/2020 for epigastric pain and diarrhea.  At that time noted history of diabetes, reflux, Graves' disease, hypertension, lupus, hypothyroidism, pancreatitis.  Pain has been ongoing for 1 week described as sharp and nonradiating rated as 8 out of 10 with no alleviating or aggravating factors identified.  2 days prior to her ER visit she began with "explosive" watery diarrhea that was nonbloody as well as associated nausea but no vomiting.  No recent sick contacts.  History of pancreatitis and history of cholecystectomy.  Labs appear to been essentially normal.    In the emergency department she underwent CT of the abdomen and pelvis which found no acute process in the abdomen or pelvis, no evidence of bowel obstruction or inflammation, mild diffuse fatty liver.  Overall in the ED differentials included reflux, gastroenteritis, pancreatitis and less likely appendicitis.  She was given pain and nausea medicine as well as Imodium  and Bentyl.  Lipase specifically normal.  She reported ongoing pain despite treatment.  Recommended outpatient follow-up with GI.  Today she states doing okay overall.  She notes persistent abdominal pain for the past 2 weeks as well as diarrhea for which she has seen fresh blood once.  She was given omeprazole by the emergency department but she is already on Dexilant so she did not take the omeprazole.  They also gave her Bentyl but she is only taking it once. Her stools seemed to have been trying to solidify, more Bristol 6 than Bristol 7/watery stools. Has a little brbpr in the commode x 1 episode. Abdominal pain is epigastric and a lot of excessive belching, some nausea but no vomiting. Seems to be worse when laying down. She has been on Dexilant. Was given Bentyl and Imodium, only took Bentyl once which helped solidify her stool a little but no improvement in pain. She was supposed to repeat her TCS but her mother passed away and she had to put it off and then forgot about it. Denies melena, fever, chills, unintentional weight loss. Denies URI or flu-like symptoms. Denies loss of sense of taste or smell. The patient has received COVID-19 vaccination(s). She did have COVID-19 about a year ago. Denies chest pain, dyspnea, dizziness, syncope, near syncope. Denies any other upper or lower GI symptoms.  Takes daily ASA 81 mg. Does take Ibuprofen 1-2 times a week.  Past Medical History:  Diagnosis Date  . Arthritis   . Depression   . Diabetes mellitus without complication ([place])   .  Fibromyalgia   . GERD (gastroesophageal reflux disease)   . Graves disease    s/p radioactive therapy  . Hypercholesteremia   . Hypertension   . Hypothyroidism   . IBS (irritable bowel syndrome)   . Kidney stone   . Lupus ([place])   . Pancreatitis   . Restless leg   . Sleep apnea   . Uvular swelling    pt states it happened after c-section but since then happens randomly at home.    Past Surgical History:   Procedure Laterality Date  . BILATERAL SALPINGOOPHORECTOMY  2005   [facility], [facility] done by Dr. Elonda Husky for AUB, chronic pelvic pain  . COLONOSCOPY  2006   RMR: internal/external hemorrhoids, proctitis  . COLONOSCOPY N/A 02/06/2013   RMR: anal canal hemorrhoids, inadequate prep. Needs screening again in April 2015  . COLONOSCOPY WITH PROPOFOL N/A 12/24/2015   Dr. Gala Romney: redundant colonic, colonic diverticulosis, cecal AVMs, tubular adenoma, surveillance in 5 years  . CYSTOSCOPY WITH RETROGRADE PYELOGRAM, URETEROSCOPY AND STENT PLACEMENT Right 01/21/2016   Procedure: RIGHT RETROGRADE, PYLEGRAM, RIGHT URETEROSCOPY LITHOTRIPSY WITH STONE  BASKET EXTRACTION, STENT PLACEMENT;  Surgeon: Ardis Hughs, MD;  Location: [facility];  Service: Urology;  Laterality: Right;  . LAPAROSCOPIC CHOLECYSTECTOMY W/ CHOLANGIOGRAPHY  2004  . POLYPECTOMY  12/24/2015   Procedure: POLYPECTOMY;  Surgeon: Daneil Dolin, MD;  Location: [facility] SUITE;  Service: Endoscopy;;  . SUPRACERVICAL ABDOMINAL HYSTERECTOMY  2005    Current Outpatient Medications  Medication Sig Dispense Refill  . amLODipine (NORVASC) 5 MG tablet Take 5 mg by mouth daily. Reported on 05/24/2016    . aspirin EC 81 MG tablet Take 81 mg by mouth daily.    Marland Kitchen atorvastatin (LIPITOR) 40 MG tablet TAKE 1 TABLET (40 MG TOTAL) BY MOUTH DAILY. (Patient taking differently: Take 40 mg by mouth daily. ) 30 tablet 1  . cholecalciferol (VITAMIN D3) 25 MCG (1000 UNIT) tablet Take 1,000 Units by mouth daily.     Marland Kitchen DEXILANT 60 MG capsule TAKE 1 CAPSULE (60 MG TOTAL) BY MOUTH DAILY. 90 capsule 3  . dicyclomine (BENTYL) 20 MG tablet Take 1 tablet (20 mg total) by mouth 2 (two) times daily. (Patient taking differently: Take 20 mg by mouth as needed. ) 20 tablet 0  . DULoxetine (CYMBALTA) 60 MG capsule Take 60 mg by mouth daily.     . fluticasone (FLONASE) 50 MCG/ACT nasal spray Place 2 sprays into the nose as needed for rhinitis or allergies.    . hydrochlorothiazide (HYDRODIURIL)  25 MG tablet Take 25 mg by mouth daily.    . hydroxychloroquine (PLAQUENIL) 200 MG tablet Take 200 mg by mouth daily.    Marland Kitchen JANUVIA 100 MG tablet Take 100 mg by mouth daily.    Marland Kitchen levothyroxine (SYNTHROID) 137 MCG tablet Take 137 mcg by mouth daily before breakfast.     . lisinopril (PRINIVIL,ZESTRIL) 40 MG tablet Take 40 mg by mouth daily.    Marland Kitchen loperamide (IMODIUM) 2 MG capsule Take 1 capsule (2 mg total) by mouth 4 (four) times daily as needed for diarrhea or loose stools. 12 capsule 0  . loratadine (CLARITIN) 10 MG tablet Take 10 mg by mouth daily.    . metFORMIN (GLUCOPHAGE) 500 MG tablet Take 1 tablet (500 mg total) by mouth daily with breakfast. (Patient taking differently: Take 1,000 mg by mouth 2 (two) times daily with a meal. ) 30 tablet 1  . methocarbamol (ROBAXIN) 750 MG tablet Take 750 mg by mouth daily  as needed (for muscle cramps or pains). Reported on 05/24/2016    . pramipexole (MIRAPEX) 0.125 MG tablet Take 0.125 mg by mouth daily as needed (restles legs). Reported on 05/24/2016    . predniSONE (DELTASONE) 5 MG tablet Take 5 mg by mouth daily as needed (lupus). Reported on 05/24/2016    . vitamin E 400 UNIT capsule Take 400 Units by mouth daily.     No current facility-administered medications for this visit.    Allergies as of 10/07/2020  . (No Known Allergies)    Family History  Adopted: Yes  Problem Relation Age of Onset  . Colon cancer Father        patient was adopted, but she knows birth father. Believes he had colon cancer  . Cancer Neg Hx     Social History   Socioeconomic History  . Marital status: Divorced    Spouse name: Not on file  . Number of children: Not on file  . Years of education: Not on file  . Highest education level: Not on file  Occupational History  . Not on file  Tobacco Use  . Smoking status: Never Smoker  . Smokeless tobacco: Never Used  . Tobacco comment: Never smoked  Substance and Sexual Activity  . Alcohol use: No     Alcohol/week: 0.0 standard drinks  . Drug use: No  . Sexual activity: Never    Birth control/protection: Surgical  Other Topics Concern  . Not on file  Social History Narrative  . Not on file   Social Determinants of Health   Financial Resource Strain:   . Difficulty of Paying Living Expenses: Not on file  Food Insecurity:   . Worried About Charity fundraiser in the Last Year: Not on file  . Ran Out of Food in the Last Year: Not on file  Transportation Needs:   . Lack of Transportation (Medical): Not on file  . Lack of Transportation (Non-Medical): Not on file  Physical Activity:   . Days of Exercise per Week: Not on file  . Minutes of Exercise per Session: Not on file  Stress:   . Feeling of Stress : Not on file  Social Connections:   . Frequency of Communication with Friends and Family: Not on file  . Frequency of Social Gatherings with Friends and Family: Not on file  . Attends Religious Services: Not on file  . Active Member of Clubs or Organizations: Not on file  . Attends Archivist Meetings: Not on file  . Marital Status: Not on file  Intimate Partner Violence:   . Fear of Current or Ex-Partner: Not on file  . Emotionally Abused: Not on file  . Physically Abused: Not on file  . Sexually Abused: Not on file    Subjective: Review of Systems  Constitutional: Negative for chills, fever, malaise/fatigue and weight loss.  HENT: Negative for congestion and sore throat.   Respiratory: Negative for cough and shortness of breath.   Cardiovascular: Negative for chest pain and palpitations.  Gastrointestinal: Positive for abdominal pain, blood in stool, diarrhea, heartburn and nausea. Negative for constipation, melena and vomiting.  Musculoskeletal: Negative for joint pain and myalgias.  Skin: Negative for rash.  Neurological: Negative for dizziness and weakness.  Endo/Heme/Allergies: Does not bruise/bleed easily.  Psychiatric/Behavioral: Negative for  depression. The patient is not nervous/anxious.   All other systems reviewed and are negative.      Objective: BP (!) 150/82   Pulse 92  Temp (!) 96.9 F (36.1 C) (Temporal)   Ht _0  (1.651 m)   Wt (!) 334 lb 9.6 oz (151.8 kg)   BMI 55.68 kg/m  Physical Exam Vitals and nursing note reviewed.  Constitutional:      General: She is not in acute distress.    Appearance: Normal appearance. She is well-developed. She is obese. She is not ill-appearing, toxic-appearing or diaphoretic.  HENT:     Head: Normocephalic and atraumatic.     Nose: No congestion or rhinorrhea.  Eyes:     General: No scleral icterus. Cardiovascular:     Rate and Rhythm: Normal rate and regular rhythm.     Heart sounds: Normal heart sounds.  Pulmonary:     Effort: Pulmonary effort is normal. No respiratory distress.     Breath sounds: Normal breath sounds.  Abdominal:     General: Bowel sounds are normal.     Palpations: Abdomen is soft. There is no hepatomegaly, splenomegaly or mass.     Tenderness: There is abdominal tenderness in the epigastric area. There is no guarding or rebound.     Hernia: No hernia is present.  Skin:    General: Skin is warm and dry.     Coloration: Skin is not jaundiced.     Findings: No rash.  Neurological:     General: No focal deficit present.     Mental Status: She is alert and oriented to person, place, and time.  Psychiatric:        Attention and Perception: Attention normal.        Mood and Affect: Mood normal.        Speech: Speech normal.        Behavior: Behavior normal.        Thought Content: Thought content normal.        Cognition and Memory: Cognition and memory normal.      Assessment:  Very pleasant 59 year old female presents for follow-up from the emergency department for abdominal pain, nausea, diarrhea.  Symptoms as described in HPI.  She is on Dexilant currently.  She is overdue for colonoscopy as well, as per HPI.  She has had some rectal  bleeding which is a bit concerning.  Abdominal pain with history of GERD: Based on her description of her symptoms, located epigastric, associated with nausea and frequent belching I feel she is likely having GERD exacerbations.  She is on daily low-dose aspirin.  However, she does take ibuprofen about twice a week.  I recommended she avoid all NSAIDs for now.  She is on Dexilant which does not seem to be managing her symptoms quite well.  I will start her on Carafate to try to get better improvement.  We will also plan for an upper endoscopy to further evaluate for etiologies.  Follow-up in 3 months and call us for any worsening.  Diarrhea: She notes diarrhea that seems to be trying to solidify, although she did have another bout of watery stools yesterday.  Differentials are quite broad including gastroenteritis, microscopic colitis/collagenous colitis, less likely malignant process or inflammatory bowel disease given no blood.  I will check stool studies at this time for any occult infection and we can treat.  If these are normal and colonoscopy (which is currently due anyway) but also help shed light on possible etiologies.  Follow-up in 3 months.  If her stool studies are negative we can offer treatment regimen such as Bentyl, Lomotil, Levsin, etc.   Proceed with  colonoscopy and EGD on propofol/MAC by Dr. Gala Romney in near future: the risks, benefits, and alternatives have been discussed with the patient in detail. The patient states understanding and desires to proceed.  ASA III  The patient is currently on Robaxin, Glucophage, Januvia, Cymbalta.  Additionally her BMI justifies propofol.  We will make appropriate adjustments to her medications and plan for propofol to promote adequate sedation.  Plan: 1. Stool studies including C. difficile PCR, C. difficile toxin assay, GI pathogen panel 2. Continue Dexilant 3. Add Carafate 4 times a day as needed 4. Avoid all NSAIDs 5. Colonoscopy and upper  endoscopy as noted above 6. Follow-up in 3 months 7. Call for worsening or severe symptoms    Thank you for allowing [place] to participate in the care of [place], DNP, AGNP-C Adult & Gerontological Nurse Practitioner [facility]   10/07/2020 1:49 PM   Disclaimer: This note was dictated with voice recognition software. Similar sounding words can inadvertently be transcribed and may not be corrected upon review.

## 2020-10-07 NOTE — Patient Instructions (Signed)
Your health issues we discussed today were:   Upper abdominal pain with a history of reflux: 1. As we discussed, I feel your upper abdominal pain is likely flares of GERD or gastritis (inflammation of the lining of your stomach) 2. Continue taking Dexilant 3. I sent a prescription for Carafate 1 g tablets to your pharmacy.  Crush up the Carafate mix with 2 to 3 tablespoons water and drink as a slurry when you have a flare of your abdominal pain 4. You can use up to 4 times a day as needed 5. Avoid all NSAIDs (ibuprofen, Motrin, Advil, Aleve, naproxen, Naprosyn, Goody powders, BC powders, anything with "NSAID" on the bottle/container) 6. We will also plan to do a upper endoscopy to further evaluate for causes 7. Call us for any worsening or severe symptoms  Diarrhea with one episode of rectal bleeding and need for repeat colonoscopy: 1. As we discussed, have your stool studies completed as soon as you can 2. Your stools must be loose/diarrhea for them to complete the testing 3. If this is negative for infection we can make recommendations for medications to help with your diarrhea 4. We will also plan to do a colonoscopy to further evaluate as well 5. Further recommendations will follow 6. Call us if any worsening or severe symptoms  Overall I recommend:  1. Continue your other current medications 2. Return for follow-up in 3 months 3. Call us for any questions or concerns   ---------------------------------------------------------------  I am glad you have gotten your COVID-19 vaccination!  Even though you are fully vaccinated you should continue to follow CDC and state/local guidelines.  ---------------------------------------------------------------   At [facility] we value your feedback. You may receive a survey about your visit today. Please share your experience as we strive to create trusting relationships with our patients to provide genuine, compassionate,  quality care.  We appreciate your understanding and patience as we review any laboratory studies, imaging, and other diagnostic tests that are ordered as we care for you. Our office policy is 5 business days for review of these results, and any emergent or urgent results are addressed in a timely manner for your best interest. If you do not hear from our office in 1 week, please contact us.   We also encourage the use of MyChart, which contains your medical information for your review as well. If you are not enrolled in this feature, an access code is on this after visit summary for your convenience. Thank you for allowing [place] to be involved in your care.  It was great to see you today!  I hope you have a Merry Christmas and Happy Holidays!!

## 2020-10-07 NOTE — Progress Notes (Signed)
  Primary Care Physician:  Maier, Andrew C, PA-C Primary Gastroenterologist:  Dr. Rourk  Chief Complaint  Patient presents with  . Abdominal Pain    for couple weeks  . Diarrhea    seen blood once  . Nausea    HPI:   [name] is a 59 y.o. female who presents on referral from emergency department for abdominal pain.  We have not seen the patient since April 2017 when she was seen for family history of colon cancer, fatty liver, cyst of the left ovary.  Previous ultrasound elastography with a Matavir score of F3/F4 but no laboratory evidence of chronic liver disease.  Marginal intermittent ALT elevation but also some normalization as well.  Pelvic ultrasounds and abdominal CT in 2016 that was inconclusive and referred to OB/GYN.  Colonoscopy up-to-date 12/24/2015 which found inadequate preparation, anal canal hemorrhoids, difficult to assess.  Recommended repeat colonoscopy in 1 year.  This is not appear to have happened.  She was seen in the emergency department 09/30/2020 for epigastric pain and diarrhea.  At that time noted history of diabetes, reflux, Graves' disease, hypertension, lupus, hypothyroidism, pancreatitis.  Pain has been ongoing for 1 week described as sharp and nonradiating rated as 8 out of 10 with no alleviating or aggravating factors identified.  2 days prior to her ER visit she began with "explosive" watery diarrhea that was nonbloody as well as associated nausea but no vomiting.  No recent sick contacts.  History of pancreatitis and history of cholecystectomy.  Labs appear to been essentially normal.    In the emergency department she underwent CT of the abdomen and pelvis which found no acute process in the abdomen or pelvis, no evidence of bowel obstruction or inflammation, mild diffuse fatty liver.  Overall in the ED differentials included reflux, gastroenteritis, pancreatitis and less likely appendicitis.  She was given pain and nausea medicine as well as Imodium  and Bentyl.  Lipase specifically normal.  She reported ongoing pain despite treatment.  Recommended outpatient follow-up with GI.  Today she states doing okay overall.  She notes persistent abdominal pain for the past 2 weeks as well as diarrhea for which she has seen fresh blood once.  She was given omeprazole by the emergency department but she is already on Dexilant so she did not take the omeprazole.  They also gave her Bentyl but she is only taking it once. Her stools seemed to have been trying to solidify, more Bristol 6 than Bristol 7/watery stools. Has a little brbpr in the commode x 1 episode. Abdominal pain is epigastric and a lot of excessive belching, some nausea but no vomiting. Seems to be worse when laying down. She has been on Dexilant. Was given Bentyl and Imodium, only took Bentyl once which helped solidify her stool a little but no improvement in pain. She was supposed to repeat her TCS but her mother passed away and she had to put it off and then forgot about it. Denies melena, fever, chills, unintentional weight loss. Denies URI or flu-like symptoms. Denies loss of sense of taste or smell. The patient has received COVID-19 vaccination(s). She did have COVID-19 about a year ago. Denies chest pain, dyspnea, dizziness, syncope, near syncope. Denies any other upper or lower GI symptoms.  Takes daily ASA 81 mg. Does take Ibuprofen 1-2 times a week.  Past Medical History:  Diagnosis Date  . Arthritis   . Depression   . Diabetes mellitus without complication (HCC)   .   Fibromyalgia   . GERD (gastroesophageal reflux disease)   . Graves disease    s/p radioactive therapy  . Hypercholesteremia   . Hypertension   . Hypothyroidism   . IBS (irritable bowel syndrome)   . Kidney stone   . Lupus (HCC)   . Pancreatitis   . Restless leg   . Sleep apnea   . Uvular swelling    pt states it happened after c-section but since then happens randomly at home.    Past Surgical History:   Procedure Laterality Date  . BILATERAL SALPINGOOPHORECTOMY  2005   SCH, RSO done by Dr. Eure for AUB, chronic pelvic pain  . COLONOSCOPY  2006   RMR: internal/external hemorrhoids, proctitis  . COLONOSCOPY N/A 02/06/2013   RMR: anal canal hemorrhoids, inadequate prep. Needs screening again in April 2015  . COLONOSCOPY WITH PROPOFOL N/A 12/24/2015   Dr. Rourk: redundant colonic, colonic diverticulosis, cecal AVMs, tubular adenoma, surveillance in 5 years  . CYSTOSCOPY WITH RETROGRADE PYELOGRAM, URETEROSCOPY AND STENT PLACEMENT Right 01/21/2016   Procedure: RIGHT RETROGRADE, PYLEGRAM, RIGHT URETEROSCOPY LITHOTRIPSY WITH STONE  BASKET EXTRACTION, STENT PLACEMENT;  Surgeon: Benjamin W Herrick, MD;  Location: [facility];  Service: Urology;  Laterality: Right;  . LAPAROSCOPIC CHOLECYSTECTOMY W/ CHOLANGIOGRAPHY  2004  . POLYPECTOMY  12/24/2015   Procedure: POLYPECTOMY;  Surgeon: Robert M Rourk, MD;  Location: [facility]ITE;  Service: Endoscopy;;  . SUPRACERVICAL ABDOMINAL HYSTERECTOMY  2005    Current Outpatient Medications  Medication Sig Dispense Refill  . amLODipine (NORVASC) 5 MG tablet Take 5 mg by mouth daily. Reported on 05/24/2016    . aspirin EC 81 MG tablet Take 81 mg by mouth daily.    . atorvastatin (LIPITOR) 40 MG tablet TAKE 1 TABLET (40 MG TOTAL) BY MOUTH DAILY. (Patient taking differently: Take 40 mg by mouth daily. ) 30 tablet 1  . cholecalciferol (VITAMIN D3) 25 MCG (1000 UNIT) tablet Take 1,000 Units by mouth daily.     . DEXILANT 60 MG capsule TAKE 1 CAPSULE (60 MG TOTAL) BY MOUTH DAILY. 90 capsule 3  . dicyclomine (BENTYL) 20 MG tablet Take 1 tablet (20 mg total) by mouth 2 (two) times daily. (Patient taking differently: Take 20 mg by mouth as needed. ) 20 tablet 0  . DULoxetine (CYMBALTA) 60 MG capsule Take 60 mg by mouth daily.     . fluticasone (FLONASE) 50 MCG/ACT nasal spray Place 2 sprays into the nose as needed for rhinitis or allergies.    . hydrochlorothiazide (HYDRODIURIL)  25 MG tablet Take 25 mg by mouth daily.    . hydroxychloroquine (PLAQUENIL) 200 MG tablet Take 200 mg by mouth daily.    . JANUVIA 100 MG tablet Take 100 mg by mouth daily.    . levothyroxine (SYNTHROID) 137 MCG tablet Take 137 mcg by mouth daily before breakfast.     . lisinopril (PRINIVIL,ZESTRIL) 40 MG tablet Take 40 mg by mouth daily.    . loperamide (IMODIUM) 2 MG capsule Take 1 capsule (2 mg total) by mouth 4 (four) times daily as needed for diarrhea or loose stools. 12 capsule 0  . loratadine (CLARITIN) 10 MG tablet Take 10 mg by mouth daily.    . metFORMIN (GLUCOPHAGE) 500 MG tablet Take 1 tablet (500 mg total) by mouth daily with breakfast. (Patient taking differently: Take 1,000 mg by mouth 2 (two) times daily with a meal. ) 30 tablet 1  . methocarbamol (ROBAXIN) 750 MG tablet Take 750 mg by mouth daily   as needed (for muscle cramps or pains). Reported on 05/24/2016    . pramipexole (MIRAPEX) 0.125 MG tablet Take 0.125 mg by mouth daily as needed (restles legs). Reported on 05/24/2016    . predniSONE (DELTASONE) 5 MG tablet Take 5 mg by mouth daily as needed (lupus). Reported on 05/24/2016    . vitamin E 400 UNIT capsule Take 400 Units by mouth daily.     No current facility-administered medications for this visit.    Allergies as of 10/07/2020  . (No Known Allergies)    Family History  Adopted: Yes  Problem Relation Age of Onset  . Colon cancer Father        patient was adopted, but she knows birth father. Believes he had colon cancer  . Cancer Neg Hx     Social History   Socioeconomic History  . Marital status: Divorced    Spouse name: Not on file  . Number of children: Not on file  . Years of education: Not on file  . Highest education level: Not on file  Occupational History  . Not on file  Tobacco Use  . Smoking status: Never Smoker  . Smokeless tobacco: Never Used  . Tobacco comment: Never smoked  Substance and Sexual Activity  . Alcohol use: No     Alcohol/week: 0.0 standard drinks  . Drug use: No  . Sexual activity: Never    Birth control/protection: Surgical  Other Topics Concern  . Not on file  Social History Narrative  . Not on file   Social Determinants of Health   Financial Resource Strain:   . Difficulty of Paying Living Expenses: Not on file  Food Insecurity:   . Worried About Running Out of Food in the Last Year: Not on file  . Ran Out of Food in the Last Year: Not on file  Transportation Needs:   . Lack of Transportation (Medical): Not on file  . Lack of Transportation (Non-Medical): Not on file  Physical Activity:   . Days of Exercise per Week: Not on file  . Minutes of Exercise per Session: Not on file  Stress:   . Feeling of Stress : Not on file  Social Connections:   . Frequency of Communication with Friends and Family: Not on file  . Frequency of Social Gatherings with Friends and Family: Not on file  . Attends Religious Services: Not on file  . Active Member of Clubs or Organizations: Not on file  . Attends Club or Organization Meetings: Not on file  . Marital Status: Not on file  Intimate Partner Violence:   . Fear of Current or Ex-Partner: Not on file  . Emotionally Abused: Not on file  . Physically Abused: Not on file  . Sexually Abused: Not on file    Subjective: Review of Systems  Constitutional: Negative for chills, fever, malaise/fatigue and weight loss.  HENT: Negative for congestion and sore throat.   Respiratory: Negative for cough and shortness of breath.   Cardiovascular: Negative for chest pain and palpitations.  Gastrointestinal: Positive for abdominal pain, blood in stool, diarrhea, heartburn and nausea. Negative for constipation, melena and vomiting.  Musculoskeletal: Negative for joint pain and myalgias.  Skin: Negative for rash.  Neurological: Negative for dizziness and weakness.  Endo/Heme/Allergies: Does not bruise/bleed easily.  Psychiatric/Behavioral: Negative for  depression. The patient is not nervous/anxious.   All other systems reviewed and are negative.      Objective: BP (!) 150/82   Pulse 92     Temp (!) 96.9 F (36.1 C) (Temporal)   Ht 5' 5" (1.651 m)   Wt (!) 334 lb 9.6 oz (151.8 kg)   BMI 55.68 kg/m  Physical Exam Vitals and nursing note reviewed.  Constitutional:      General: She is not in acute distress.    Appearance: Normal appearance. She is well-developed. She is obese. She is not ill-appearing, toxic-appearing or diaphoretic.  HENT:     Head: Normocephalic and atraumatic.     Nose: No congestion or rhinorrhea.  Eyes:     General: No scleral icterus. Cardiovascular:     Rate and Rhythm: Normal rate and regular rhythm.     Heart sounds: Normal heart sounds.  Pulmonary:     Effort: Pulmonary effort is normal. No respiratory distress.     Breath sounds: Normal breath sounds.  Abdominal:     General: Bowel sounds are normal.     Palpations: Abdomen is soft. There is no hepatomegaly, splenomegaly or mass.     Tenderness: There is abdominal tenderness in the epigastric area. There is no guarding or rebound.     Hernia: No hernia is present.  Skin:    General: Skin is warm and dry.     Coloration: Skin is not jaundiced.     Findings: No rash.  Neurological:     General: No focal deficit present.     Mental Status: She is alert and oriented to person, place, and time.  Psychiatric:        Attention and Perception: Attention normal.        Mood and Affect: Mood normal.        Speech: Speech normal.        Behavior: Behavior normal.        Thought Content: Thought content normal.        Cognition and Memory: Cognition and memory normal.      Assessment:  Very pleasant 59-year-old female presents for follow-up from the emergency department for abdominal pain, nausea, diarrhea.  Symptoms as described in HPI.  She is on Dexilant currently.  She is overdue for colonoscopy as well, as per HPI.  She has had some rectal  bleeding which is a bit concerning.  Abdominal pain with history of GERD: Based on her description of her symptoms, located epigastric, associated with nausea and frequent belching I feel she is likely having GERD exacerbations.  She is on daily low-dose aspirin.  However, she does take ibuprofen about twice a week.  I recommended she avoid all NSAIDs for now.  She is on Dexilant which does not seem to be managing her symptoms quite well.  I will start her on Carafate to try to get better improvement.  We will also plan for an upper endoscopy to further evaluate for etiologies.  Follow-up in 3 months and call us for any worsening.  Diarrhea: She notes diarrhea that seems to be trying to solidify, although she did have another bout of watery stools yesterday.  Differentials are quite broad including gastroenteritis, microscopic colitis/collagenous colitis, less likely malignant process or inflammatory bowel disease given no blood.  I will check stool studies at this time for any occult infection and we can treat.  If these are normal and colonoscopy (which is currently due anyway) but also help shed light on possible etiologies.  Follow-up in 3 months.  If her stool studies are negative we can offer treatment regimen such as Bentyl, Lomotil, Levsin, etc.   Proceed with   colonoscopy and EGD on propofol/MAC by Dr. Rourk in near future: the risks, benefits, and alternatives have been discussed with the patient in detail. The patient states understanding and desires to proceed.  ASA III  The patient is currently on Robaxin, Glucophage, Januvia, Cymbalta.  Additionally her BMI justifies propofol.  We will make appropriate adjustments to her medications and plan for propofol to promote adequate sedation.  Plan: 1. Stool studies including C. difficile PCR, C. difficile toxin assay, GI pathogen panel 2. Continue Dexilant 3. Add Carafate 4 times a day as needed 4. Avoid all NSAIDs 5. Colonoscopy and upper  endoscopy as noted above 6. Follow-up in 3 months 7. Call for worsening or severe symptoms    Thank you for allowing us to participate in the care of Randalyn M Enlow  Maryagnes Carrasco, DNP, AGNP-C Adult & Gerontological Nurse Practitioner [facility]   10/07/2020 1:49 PM   Disclaimer: This note was dictated with voice recognition software. Similar sounding words can inadvertently be transcribed and may not be corrected upon review.  

## 2020-10-08 ENCOUNTER — Encounter: Payer: Self-pay | Admitting: *Deleted

## 2020-10-09 ENCOUNTER — Other Ambulatory Visit: Payer: Self-pay

## 2020-10-09 ENCOUNTER — Other Ambulatory Visit (HOSPITAL_COMMUNITY)
Admission: RE | Admit: 2020-10-09 | Discharge: 2020-10-09 | Disposition: A | Discharge status: Home / Self Care | Payer: Medicare Other | Source: Ambulatory Visit | Attending: Nurse Practitioner | Admitting: Nurse Practitioner

## 2020-10-09 ENCOUNTER — Telehealth: Payer: Self-pay

## 2020-10-09 DIAGNOSIS — R1013 Epigastric pain: Secondary | ICD-10-CM | POA: Insufficient documentation

## 2020-10-09 DIAGNOSIS — R197 Diarrhea, unspecified: Secondary | ICD-10-CM | POA: Insufficient documentation

## 2020-10-09 DIAGNOSIS — K219 Gastro-esophageal reflux disease without esophagitis: Secondary | ICD-10-CM | POA: Insufficient documentation

## 2020-10-09 DIAGNOSIS — K625 Hemorrhage of anus and rectum: Secondary | ICD-10-CM | POA: Insufficient documentation

## 2020-10-09 NOTE — Telephone Encounter (Signed)
FYI, the lab from [facility]. Pt turned in formed stool for her Cdiff collection this morning and they had to cancel the cdiff testing.   Spoke with pt. Pt was advised to call back when she has diarrhea and we can submit for stool studies at that time.

## 2020-10-13 NOTE — Telephone Encounter (Signed)
Noted, thanks for the update 

## 2020-10-26 NOTE — Patient Instructions (Signed)
Your procedure is scheduled on: 10/29/2020  Report to [facility] at    9:30 AM.  Call this number if you have problems the morning of surgery: [phone]   Remember:              Follow Directions on the letter you received from Your Physician's office regarding the Bowel Prep              No Smoking the day of Procedure :   Take these medicines the morning of surgery with A SIP OF WATER: Dexilant, Cymbalta, levothyroxine   (Flonase, Pramipexole, Predisone, and/or Claritin  If needed)  No diabetic medication am of procedure   Do not wear jewelry, make-up or nail polish.    Do not bring valuables to the hospital.  Contacts, dentures or bridgework may not be worn into surgery.  .   Patients discharged the day of surgery will not be allowed to drive home.     Colonoscopy, Adult, Care After This sheet gives you information about how to care for yourself after your procedure. Your health care provider may also give you more specific instructions. If you have problems or questions, contact your health care provider. What can I expect after the procedure? After the procedure, it is common to have:  A small amount of blood in your stool for 24 hours after the procedure.  Some gas.  Mild abdominal cramping or bloating.  Follow these instructions at home: General instructions   For the first 24 hours after the procedure: ? Do not drive or use machinery. ? Do not sign important documents. ? Do not drink alcohol. ? Do your regular daily activities at a slower pace than normal. ? Eat soft, easy-to-digest foods. ? Rest often.  Take over-the-counter or prescription medicines only as told by your health care provider.  It is up to you to get the results of your procedure. Ask your health care provider, or the department performing the procedure, when your results will be ready. Relieving cramping and bloating  Try walking around when you have cramps or feel bloated.  Apply  heat to your abdomen as told by your health care provider. Use a heat source that your health care provider recommends, such as a moist heat pack or a heating pad. ? Place a towel between your skin and the heat source. ? Leave the heat on for 20-30 minutes. ? Remove the heat if your skin turns bright red. This is especially important if you are unable to feel pain, heat, or cold. You may have a greater risk of getting burned. Eating and drinking  Drink enough fluid to keep your urine clear or pale yellow.  Resume your normal diet as instructed by your health care provider. Avoid heavy or fried foods that are hard to digest.  Avoid drinking alcohol for as long as instructed by your health care provider. Contact a health care provider if:  You have blood in your stool 2-3 days after the procedure. Get help right away if:  You have more than a small spotting of blood in your stool.  You pass large blood clots in your stool.  Your abdomen is swollen.  You have nausea or vomiting.  You have a fever.  You have increasing abdominal pain that is not relieved with medicine. This information is not intended to replace advice given to you by your health care provider. Make sure you discuss any questions you have with your health care  provider. Document Released: 06/07/2004 Document Revised: 07/18/2016 Document Reviewed: 01/05/2016 Elsevier Interactive Patient Education  2018 [facility], Adult, Care After This sheet gives you information about how to care for yourself after your procedure. Your health care provider may also give you more specific instructions. If you have problems or questions, contact your health care provider. What can I expect after the procedure? After the procedure, it is common to have:  A sore throat.  Mild stomach pain or discomfort.  Bloating.  Nausea. Follow these instructions at home:   Follow instructions from your health care  provider about what to eat or drink after your procedure.  Return to your normal activities as told by your health care provider. Ask your health care provider what activities are safe for you.  Take over-the-counter and prescription medicines only as told by your health care provider.  Do not drive for 24 hours if you were given a sedative during your procedure.  Keep all follow-up visits as told by your health care provider. This is important. Contact a health care provider if you have:  A sore throat that lasts longer than one day.  Trouble swallowing. Get help right away if:  You vomit blood or your vomit looks like coffee grounds.  You have: ? A fever. ? Bloody, black, or tarry stools. ? A severe sore throat or you cannot swallow. ? Difficulty breathing. ? Severe pain in your chest or abdomen. Summary  After the procedure, it is common to have a sore throat, mild stomach discomfort, bloating, and nausea.  Do not drive for 24 hours if you were given a sedative during the procedure.  Follow instructions from your health care provider about what to eat or drink after your procedure.  Return to your normal activities as told by your health care provider. This information is not intended to replace advice given to you by your health care provider. Make sure you discuss any questions you have with your health care provider. Document Revised: 04/17/2018 Document Reviewed: 03/26/2018 Elsevier Patient Education  [place].

## 2020-10-27 ENCOUNTER — Encounter (HOSPITAL_COMMUNITY)
Admission: RE | Admit: 2020-10-27 | Discharge: 2020-10-27 | Disposition: A | Discharge status: Home / Self Care | Payer: Medicare Other | Source: Ambulatory Visit | Attending: Internal Medicine | Admitting: Internal Medicine

## 2020-10-27 ENCOUNTER — Encounter (HOSPITAL_COMMUNITY): Payer: Self-pay

## 2020-10-27 ENCOUNTER — Other Ambulatory Visit (HOSPITAL_COMMUNITY)
Admission: RE | Admit: 2020-10-27 | Discharge: 2020-10-27 | Disposition: A | Discharge status: Home / Self Care | Payer: Medicare Other | Source: Ambulatory Visit | Attending: Internal Medicine | Admitting: Internal Medicine

## 2020-10-27 ENCOUNTER — Other Ambulatory Visit: Payer: Self-pay

## 2020-10-27 DIAGNOSIS — Z01818 Encounter for other preprocedural examination: Secondary | ICD-10-CM | POA: Insufficient documentation

## 2020-10-27 DIAGNOSIS — I10 Essential (primary) hypertension: Secondary | ICD-10-CM | POA: Insufficient documentation

## 2020-10-27 DIAGNOSIS — Z20822 Contact with and (suspected) exposure to covid-19: Secondary | ICD-10-CM | POA: Diagnosis not present

## 2020-10-27 LAB — SARS CORONAVIRUS 2 (TAT 6-24 HRS): SARS Coronavirus 2: NEGATIVE

## 2020-10-28 ENCOUNTER — Telehealth: Payer: Self-pay

## 2020-10-28 NOTE — Telephone Encounter (Signed)
Pt called office and LMOVM. Called pt back, she has started her prep for TCS tomorrow. She hasn't started going to bathroom yet. Advised her to continue w/prep per instructions and if not clear and water by 6:00pm to do Miralax every 30 minutes per instructions. Encouraged plenty of clear liquids. Gave her phone# for [facility] in case she has any further problems this evening. Advised her we have GI on call if needed. Verbalized understanding.

## 2020-10-29 ENCOUNTER — Ambulatory Visit (HOSPITAL_COMMUNITY): Payer: Medicare Other | Admitting: Anesthesiology

## 2020-10-29 ENCOUNTER — Other Ambulatory Visit: Payer: Self-pay

## 2020-10-29 ENCOUNTER — Encounter (HOSPITAL_COMMUNITY)
Admission: RE | Disposition: A | Discharge status: Home / Self Care | Payer: Self-pay | Source: Home / Self Care | Attending: Internal Medicine

## 2020-10-29 ENCOUNTER — Ambulatory Visit (HOSPITAL_COMMUNITY)
Admission: RE | Admit: 2020-10-29 | Discharge: 2020-10-29 | Disposition: A | Discharge status: Home / Self Care | Payer: Medicare Other | Attending: Internal Medicine | Admitting: Internal Medicine

## 2020-10-29 ENCOUNTER — Encounter (HOSPITAL_COMMUNITY): Payer: Self-pay | Admitting: Internal Medicine

## 2020-10-29 DIAGNOSIS — K635 Polyp of colon: Secondary | ICD-10-CM

## 2020-10-29 DIAGNOSIS — Z79899 Other long term (current) drug therapy: Secondary | ICD-10-CM | POA: Diagnosis not present

## 2020-10-29 DIAGNOSIS — R233 Spontaneous ecchymoses: Secondary | ICD-10-CM | POA: Diagnosis not present

## 2020-10-29 DIAGNOSIS — K3189 Other diseases of stomach and duodenum: Secondary | ICD-10-CM

## 2020-10-29 DIAGNOSIS — D123 Benign neoplasm of transverse colon: Secondary | ICD-10-CM | POA: Diagnosis not present

## 2020-10-29 DIAGNOSIS — K921 Melena: Secondary | ICD-10-CM | POA: Diagnosis present

## 2020-10-29 DIAGNOSIS — R1013 Epigastric pain: Secondary | ICD-10-CM | POA: Diagnosis not present

## 2020-10-29 DIAGNOSIS — Z7982 Long term (current) use of aspirin: Secondary | ICD-10-CM | POA: Diagnosis not present

## 2020-10-29 DIAGNOSIS — Z7984 Long term (current) use of oral hypoglycemic drugs: Secondary | ICD-10-CM | POA: Diagnosis not present

## 2020-10-29 DIAGNOSIS — Z800 Family history of malignant neoplasm of digestive organs: Secondary | ICD-10-CM | POA: Insufficient documentation

## 2020-10-29 DIAGNOSIS — K640 First degree hemorrhoids: Secondary | ICD-10-CM | POA: Diagnosis not present

## 2020-10-29 HISTORY — PX: POLYPECTOMY: SHX5525

## 2020-10-29 HISTORY — PX: ESOPHAGOGASTRODUODENOSCOPY (EGD) WITH PROPOFOL: SHX5813

## 2020-10-29 HISTORY — PX: COLONOSCOPY WITH PROPOFOL: SHX5780

## 2020-10-29 LAB — GLUCOSE, CAPILLARY
Glucose-Capillary: 120 mg/dL — ABNORMAL HIGH (ref 70–99)
Glucose-Capillary: 100 mg/dL — ABNORMAL HIGH (ref 70–99)

## 2020-10-29 SURGERY — COLONOSCOPY WITH PROPOFOL
Anesthesia: General

## 2020-10-29 MED ORDER — GLYCOPYRROLATE 0.2 MG/ML IJ SOLN
INTRAMUSCULAR | Status: AC
  Filled 2020-10-29: qty 1

## 2020-10-29 MED ORDER — KETAMINE HCL 10 MG/ML IJ SOLN
INTRAMUSCULAR | Status: DC | PRN
  Administered 2020-10-29 (×2): 10 mg via INTRAVENOUS

## 2020-10-29 MED ORDER — LIDOCAINE VISCOUS HCL 2 % MT SOLN
OROMUCOSAL | Status: AC
  Filled 2020-10-29: qty 15

## 2020-10-29 MED ORDER — PROPOFOL 10 MG/ML IV BOLUS
INTRAVENOUS | Status: DC | PRN
  Administered 2020-10-29: 50 mg via INTRAVENOUS

## 2020-10-29 MED ORDER — GLYCOPYRROLATE 0.2 MG/ML IJ SOLN
0.2000 mg | Freq: Once | INTRAMUSCULAR | Status: AC
  Administered 2020-10-29: 10:00:00 0.2 mg via INTRAVENOUS

## 2020-10-29 MED ORDER — LIDOCAINE HCL (PF) 2 % IJ SOLN
INTRAMUSCULAR | Status: AC
  Filled 2020-10-29: qty 20

## 2020-10-29 MED ORDER — STERILE WATER FOR IRRIGATION IR SOLN
Status: DC | PRN
  Administered 2020-10-29: 10:00:00 200 mL

## 2020-10-29 MED ORDER — EPHEDRINE 5 MG/ML INJ
INTRAVENOUS | Status: AC
  Filled 2020-10-29: qty 10

## 2020-10-29 MED ORDER — KETAMINE HCL 50 MG/5ML IJ SOSY
PREFILLED_SYRINGE | INTRAMUSCULAR | Status: AC
  Filled 2020-10-29: qty 5

## 2020-10-29 MED ORDER — LIDOCAINE VISCOUS HCL 2 % MT SOLN
15.0000 mL | Freq: Once | OROMUCOSAL | Status: AC
  Administered 2020-10-29: 10:00:00 15 mL via OROMUCOSAL

## 2020-10-29 MED ORDER — LIDOCAINE HCL (CARDIAC) PF 100 MG/5ML IV SOSY
PREFILLED_SYRINGE | INTRAVENOUS | Status: DC | PRN
  Administered 2020-10-29: 100 mg via INTRAVENOUS

## 2020-10-29 MED ORDER — LACTATED RINGERS IV SOLN
Freq: Once | INTRAVENOUS | Status: AC
  Administered 2020-10-29: 10:00:00 1000 mL via INTRAVENOUS

## 2020-10-29 MED ORDER — PROPOFOL 500 MG/50ML IV EMUL
INTRAVENOUS | Status: DC | PRN
  Administered 2020-10-29: 150 ug/kg/min via INTRAVENOUS

## 2020-10-29 NOTE — Interval H&P Note (Signed)
History and Physical Interval Note:  10/29/2020 10:01 AM  [name]  has presented today for surgery, with the diagnosis of diarrhea, hematochezia, epigastric pain.  The various methods of treatment have been discussed with the patient and family. After consideration of risks, benefits and other options for treatment, the patient has consented to  Procedure(s) with comments: COLONOSCOPY WITH PROPOFOL (N/A) - 11:00am ESOPHAGOGASTRODUODENOSCOPY (EGD) WITH PROPOFOL (N/A) as a surgical intervention.  The patient's history has been reviewed, patient examined, no change in status, stable for surgery.  I have reviewed the patient's chart and labs.  Questions were answered to the patient's satisfaction.     Manus Rudd   All of patient's GI symptoms have resolved.  Never submitted stool because diarrhea resolved.  No dysphagia.  EGD and colonoscopy per plan.  The risks, benefits, limitations, imponderables and alternatives regarding both EGD and colonoscopy have been reviewed with the patient. Questions have been answered. All parties agreeable.    Patient's

## 2020-10-29 NOTE — Anesthesia Preprocedure Evaluation (Addendum)
Anesthesia Evaluation  Patient identified by MRN, date of birth, ID band Patient awake    Reviewed: Allergy & Precautions, NPO status , Patient's Chart, lab work & pertinent test results  History of Anesthesia Complications Negative for: history of anesthetic complications  Airway Mallampati: II  TM Distance: >3 FB Neck ROM: Full    Dental  (+) Dental Advisory Given,    Pulmonary sleep apnea and Continuous Positive Airway Pressure Ventilation ,    Pulmonary exam normal breath sounds clear to auscultation       Cardiovascular Exercise Tolerance: Good hypertension, Pt. on medications Normal cardiovascular exam Rhythm:Regular Rate:Normal     Neuro/Psych PSYCHIATRIC DISORDERS Depression  Neuromuscular disease    GI/Hepatic Neg liver ROS, GERD  Medicated and Controlled,  Endo/Other  diabetes, Well Controlled, Type 2, Oral Hypoglycemic AgentsHypothyroidism Morbid obesity  Renal/GU Renal disease     Musculoskeletal  (+) Arthritis , Fibromyalgia -  Abdominal   Peds  Hematology   Anesthesia Other Findings   Reproductive/Obstetrics                            Anesthesia Physical Anesthesia Plan  ASA: III  Anesthesia Plan: General   Post-op Pain Management:    Induction: Intravenous  PONV Risk Score and Plan: TIVA  Airway Management Planned: Nasal Cannula, Natural Airway and Simple Face Mask  Additional Equipment:   Intra-op Plan:   Post-operative Plan:   Informed Consent: I have reviewed the patients History and Physical, chart, labs and discussed the procedure including the risks, benefits and alternatives for the proposed anesthesia with the patient or authorized representative who has indicated his/her understanding and acceptance.       Plan Discussed with: CRNA and Surgeon  Anesthesia Plan Comments:        Anesthesia Quick Evaluation

## 2020-10-29 NOTE — Transfer of Care (Signed)
Immediate Anesthesia Transfer of Care Note  Patient: [name]  Procedure(s) Performed: COLONOSCOPY WITH PROPOFOL (N/A ) ESOPHAGOGASTRODUODENOSCOPY (EGD) WITH PROPOFOL (N/A ) POLYPECTOMY  Patient Location: PACU  Anesthesia Type:MAC and General  Level of Consciousness: awake, alert , oriented and patient cooperative  Airway & Oxygen Therapy: Patient Spontanous Breathing  Post-op Assessment: Report given to RN and Post -op Vital signs reviewed and stable  Post vital signs: Reviewed and stable  Last Vitals:  Vitals Value Taken Time  BP 105/82 10/29/20 1050  Temp    Pulse 81 10/29/20 1051  Resp 20 10/29/20 1051  SpO2 98 % 10/29/20 1051  Vitals shown include unvalidated device data.  Last Pain:  Vitals:   10/29/20 1011  TempSrc:   PainSc: 0-No pain      Patients Stated Pain Goal: 8 (36/12/24 4975)  Complications: No complications documented.

## 2020-10-29 NOTE — Discharge Instructions (Signed)
Colonoscopy Discharge Instructions  Read the instructions outlined below and refer to this sheet in the next few weeks. These discharge instructions provide you with general information on caring for yourself after you leave the hospital. Your doctor may also give you specific instructions. While your treatment has been planned according to the most current medical practices available, unavoidable complications occasionally occur. If you have any problems or questions after discharge, call Dr. Gala Romney at [phone]. ACTIVITY  You may resume your regular activity, but move at a slower pace for the next 24 hours.   Take frequent rest periods for the next 24 hours.   Walking will help get rid of the air and reduce the bloated feeling in your belly (abdomen).   No driving for 24 hours (because of the medicine (anesthesia) used during the test).    Do not sign any important legal documents or operate any machinery for 24 hours (because of the anesthesia used during the test).  NUTRITION  Drink plenty of fluids.   You may resume your normal diet as instructed by your doctor.   Begin with a light meal and progress to your normal diet. Heavy or fried foods are harder to digest and may make you feel sick to your stomach (nauseated).   Avoid alcoholic beverages for 24 hours or as instructed.  MEDICATIONS  You may resume your normal medications unless your doctor tells you otherwise.  WHAT YOU CAN EXPECT TODAY  Some feelings of bloating in the abdomen.   Passage of more gas than usual.   Spotting of blood in your stool or on the toilet paper.  IF YOU HAD POLYPS REMOVED DURING THE COLONOSCOPY:  No aspirin products for 7 days or as instructed.   No alcohol for 7 days or as instructed.   Eat a soft diet for the next 24 hours.  FINDING OUT THE RESULTS OF YOUR TEST Not all test results are available during your visit. If your test results are not back during the visit, make an appointment  with your caregiver to find out the results. Do not assume everything is normal if you have not heard from your caregiver or the medical facility. It is important for you to follow up on all of your test results.  SEEK IMMEDIATE MEDICAL ATTENTION IF:  You have more than a spotting of blood in your stool.   Your belly is swollen (abdominal distention).   You are nauseated or vomiting.   You have a temperature over 101.   You have abdominal pain or discomfort that is severe or gets worse throughout the day.   EGD Discharge instructions Please read the instructions outlined below and refer to this sheet in the next few weeks. These discharge instructions provide you with general information on caring for yourself after you leave the hospital. Your doctor may also give you specific instructions. While your treatment has been planned according to the most current medical practices available, unavoidable complications occasionally occur. If you have any problems or questions after discharge, please call your doctor. ACTIVITY  You may resume your regular activity but move at a slower pace for the next 24 hours.   Take frequent rest periods for the next 24 hours.   Walking will help expel (get rid of) the air and reduce the bloated feeling in your abdomen.   No driving for 24 hours (because of the anesthesia (medicine) used during the test).   You may shower.   Do not sign any  important legal documents or operate any machinery for 24 hours (because of the anesthesia used during the test).  NUTRITION  Drink plenty of fluids.   You may resume your normal diet.   Begin with a light meal and progress to your normal diet.   Avoid alcoholic beverages for 24 hours or as instructed by your caregiver.  MEDICATIONS  You may resume your normal medications unless your caregiver tells you otherwise.  WHAT YOU CAN EXPECT TODAY  You may experience abdominal discomfort such as a feeling of  fullness or "gas" pains.  FOLLOW-UP  Your doctor will discuss the results of your test with you.  SEEK IMMEDIATE MEDICAL ATTENTION IF ANY OF THE FOLLOWING OCCUR:  Excessive nausea (feeling sick to your stomach) and/or vomiting.   Severe abdominal pain and distention (swelling).   Trouble swallowing.   Temperature over 101 F (37.8 C).   Rectal bleeding or vomiting of blood.   1 polyp removed from your colon today  Your upper GI tract looked good  Further recommendations to follow pending review of pathology report  Office appointment with [facility] Field February 2022 if not already scheduled  At patient request, I called Althea Charon [phone]  -reviewed results             Colon Polyps  Polyps are tissue growths inside the body. Polyps can grow in many places, including the large intestine (colon). A polyp may be a round bump or a mushroom-shaped growth. You could have one polyp or several. Most colon polyps are noncancerous (benign). However, some colon polyps can become cancerous over time. Finding and removing the polyps early can help prevent this. What are the causes? The exact cause of colon polyps is not known. What increases the risk? You are more likely to develop this condition if you:  Have a family history of colon cancer or colon polyps.  Are older than 42 or older than 45 if you are African American.  Have inflammatory bowel disease, such as ulcerative colitis or Crohn's disease.  Have certain hereditary conditions, such as: ? Familial adenomatous polyposis. ? Lynch syndrome. ? Turcot syndrome. ? Peutz-Jeghers syndrome.  Are overweight.  Smoke cigarettes.  Do not get enough exercise.  Drink too much alcohol.  Eat a diet that is high in fat and red meat and low in fiber.  Had childhood cancer that was treated with abdominal radiation. What are the signs or symptoms? Most polyps do not cause symptoms. If you have symptoms, they may  include:  Blood coming from your rectum when having a bowel movement.  Blood in your stool. The stool may look dark red or black.  Abdominal pain.  A change in bowel habits, such as constipation or diarrhea. How is this diagnosed? This condition is diagnosed with a colonoscopy. This is a procedure in which a lighted, flexible scope is inserted into the anus and then passed into the colon to examine the area. Polyps are sometimes found when a colonoscopy is done as part of routine cancer screening tests. How is this treated? Treatment for this condition involves removing any polyps that are found. Most polyps can be removed during a colonoscopy. Those polyps will then be tested for cancer. Additional treatment may be needed depending on the results of testing. Follow these instructions at home: Lifestyle  Maintain a healthy weight, or lose weight if recommended by your health care provider.  Exercise every day or as told by your health care provider.  Do  not use any products that contain nicotine or tobacco, such as cigarettes and e-cigarettes. If you need help quitting, ask your health care provider.  If you drink alcohol, limit how much you have: ? 0-1 drink a day for women. ? 0-2 drinks a day for men.  Be aware of how much alcohol is in your drink. In the [place] one drink equals one 12 oz bottle of beer (355 mL), one 5 oz glass of wine (148 mL), or one 1 oz shot of hard liquor (44 mL). Eating and drinking   Eat foods that are high in fiber, such as fruits, vegetables, and whole grains.  Eat foods that are high in calcium and vitamin D, such as milk, cheese, yogurt, eggs, liver, fish, and broccoli.  Limit foods that are high in fat, such as fried foods and desserts.  Limit the amount of red meat and processed meat you eat, such as hot dogs, sausage, bacon, and lunch meats. General instructions  Keep all follow-up visits as told by your health care provider. This is  important. ? This includes having regularly scheduled colonoscopies. ? Talk to your health care provider about when you need a colonoscopy. Contact a health care provider if:  You have new or worsening bleeding during a bowel movement.  You have new or increased blood in your stool.  You have a change in bowel habits.  You lose weight for no known reason. Summary  Polyps are tissue growths inside the body. Polyps can grow in many places, including the colon.  Most colon polyps are noncancerous (benign), but some can become cancerous over time.  This condition is diagnosed with a colonoscopy.  Treatment for this condition involves removing any polyps that are found. Most polyps can be removed during a colonoscopy. This information is not intended to replace advice given to you by your health care provider. Make sure you discuss any questions you have with your health care provider. Document Revised: 02/08/2018 Document Reviewed: 02/08/2018 Elsevier Patient Education  [place] After These instructions provide you with information about caring for yourself after your procedure. Your health care provider may also give you more specific instructions. Your treatment has been planned according to current medical practices, but problems sometimes occur. Call your health care provider if you have any problems or questions after your procedure. What can I expect after the procedure? After your procedure, you may:  Feel sleepy for several hours.  Feel clumsy and have poor balance for several hours.  Feel forgetful about what happened after the procedure.  Have poor judgment for several hours.  Feel nauseous or vomit.  Have a sore throat if you had a breathing tube during the procedure. Follow these instructions at home: For at least 24 hours after the procedure:      Have a responsible adult stay with you. It is important to have  someone help care for you until you are awake and alert.  Rest as needed.  Do not: ? Participate in activities in which you could fall or become injured. ? Drive. ? Use heavy machinery. ? Drink alcohol. ? Take sleeping pills or medicines that cause drowsiness. ? Make important decisions or sign legal documents. ? Take care of children on your own. Eating and drinking  Follow the diet that is recommended by your health care provider.  If you vomit, drink water, juice, or soup when you can drink without vomiting.  Make sure you have little or no nausea before eating solid foods. General instructions  Take over-the-counter and prescription medicines only as told by your health care provider.  If you have sleep apnea, surgery and certain medicines can increase your risk for breathing problems. Follow instructions from your health care provider about wearing your sleep device: ? Anytime you are sleeping, including during daytime naps. ? While taking prescription pain medicines, sleeping medicines, or medicines that make you drowsy.  If you smoke, do not smoke without supervision.  Keep all follow-up visits as told by your health care provider. This is important. Contact a health care provider if:  You keep feeling nauseous or you keep vomiting.  You feel light-headed.  You develop a rash.  You have a fever. Get help right away if:  You have trouble breathing. Summary  For several hours after your procedure, you may feel sleepy and have poor judgment.  Have a responsible adult stay with you for at least 24 hours or until you are awake and alert. This information is not intended to replace advice given to you by your health care provider. Make sure you discuss any questions you have with your health care provider. Document Revised: 01/22/2018 Document Reviewed: 02/14/2016 Elsevier Patient Education  [place].

## 2020-10-29 NOTE — Op Note (Signed)
[facility] Patient Name: [name] Procedure Date: 10/29/2020 10:22 AM MRN: [id number] Date of Birth: 06/29/1961 Attending MD: Gennette Pac , MD CSN: [id number] Age: 59 Admit Type: Outpatient Procedure:                Colonoscopy Indications:              Hematochezia [place] Neighbor per pt report) Providers:                Gennette Pac, MD, Sheran Fava,                            Pandora Leiter, Technician Referring MD:              Medicines:                Propofol per Anesthesia Complications:            No immediate complications. Estimated Blood Loss:     Estimated blood loss was minimal. Procedure:                Pre-Anesthesia Assessment:                           - Prior to the procedure, a History and Physical                            was performed, and patient medications and                            allergies were reviewed. The patient's tolerance of                            previous anesthesia was also reviewed. The risks                            and benefits of the procedure and the sedation                            options and risks were discussed with the patient.                            All questions were answered, and informed consent                            was obtained. Prior Anticoagulants: The patient has                            taken no previous anticoagulant or antiplatelet                            agents. ASA Grade Assessment: III - A patient with                            severe systemic disease. After reviewing the risks  and benefits, the patient was deemed in                            satisfactory condition to undergo the procedure.                           After obtaining informed consent, the colonoscope                            was passed under direct vision. Throughout the                            procedure, the patient's blood pressure, pulse, and                             oxygen saturations were monitored continuously. The                            CF-[id number] ([id number]) scope was introduced through                            the anus and advanced to the the cecum, identified                            by appendiceal orifice and ileocecal valve. The                            colonoscopy was performed without difficulty. The                            patient tolerated the procedure well. The quality                            of the bowel preparation was adequate. Scope In: 10:23:51 AM Scope Out: 10:42:04 AM Scope Withdrawal Time: 0 hours 8 minutes 33 seconds  Total Procedure Duration: 0 hours 18 minutes 13 seconds  Findings:      The perianal and digital rectal examinations were normal.      Non-bleeding internal hemorrhoids were found during retroflexion. The       hemorrhoids were moderate, medium-sized and Grade I (internal       hemorrhoids that do not prolapse).      A 6 mm polyp was found in the mid transverse colon. The polyp was       semi-pedunculated. The polyp was removed with a cold snare. Resection       and retrieval were complete. Estimated blood loss was minimal.      The exam was otherwise without abnormality on direct and retroflexion       views. Impression:               - Non-bleeding internal hemorrhoids.                           - One 6 mm polyp in the mid transverse colon,  removed with a cold snare. Resected and retrieved.                           - The examination was otherwise normal on direct                            and retroflexion views. Moderate Sedation:      Moderate (conscious) sedation was personally administered by an       anesthesia professional. The following parameters were monitored: oxygen       saturation, heart rate, blood pressure, respiratory rate, EKG, adequacy       of pulmonary ventilation, and response to care. Recommendation:           - Patient has a contact number  available for                            emergencies. The signs and symptoms of potential                            delayed complications were discussed with the                            patient. Return to normal activities tomorrow.                            Written discharge instructions were provided to the                            patient.                           - Resume previous diet.                           - Continue present medications.                           - Repeat colonoscopy date to be determined after                            pending pathology results are reviewed for                            surveillance.                           - Return to GI office in 2 months. See EGD report Procedure Code(s):        --- Professional ---                           [phone], Colonoscopy, flexible; with removal of                            tumor(s), polyp(s), or other lesion(s) by snare  technique Diagnosis Code(s):        --- Professional ---                           K63.5, Polyp of colon                           K64.0, First degree hemorrhoids                           K92.1, Melena (includes Hematochezia) CPT copyright 2019 American Medical Association. All rights reserved. The codes documented in this report are preliminary and upon coder review may  be revised to meet current compliance requirements. Gerrit Friends. Earlisha Sharples, MD Gennette Pac, MD 10/29/2020 11:00:45 AM This report has been signed electronically. Number of Addenda: 0

## 2020-10-29 NOTE — Anesthesia Postprocedure Evaluation (Signed)
Anesthesia Post Note  Patient: [name]  Procedure(s) Performed: COLONOSCOPY WITH PROPOFOL (N/A ) ESOPHAGOGASTRODUODENOSCOPY (EGD) WITH PROPOFOL (N/A ) POLYPECTOMY  Patient location during evaluation: PACU Anesthesia Type: General Level of consciousness: awake and alert Pain management: pain level controlled Vital Signs Assessment: post-procedure vital signs reviewed and stable Respiratory status: spontaneous breathing Cardiovascular status: stable Postop Assessment: no apparent nausea or vomiting Anesthetic complications: no   No complications documented.   Last Vitals:  Vitals:   10/29/20 0928  BP: (!) 143/51  Pulse: 74  Resp: 20  Temp: 36.8 C  SpO2: 93%    Last Pain:  Vitals:   10/29/20 1011  TempSrc:   PainSc: 0-No pain                 Everette Rank

## 2020-10-29 NOTE — Op Note (Signed)
[facility] Patient Name: [name] Procedure Date: 10/29/2020 10:03 AM MRN: [id number] Date of Birth: 1961-07-12 Attending MD: Gennette Pac , MD CSN: [id number] Age: 59 Admit Type: Outpatient Procedure:                Upper GI endoscopy Indications:              Epigastric abdominal pain(resolved) Providers:                Gennette Pac, MD, Sheran Fava,                            Pandora Leiter, Technician Referring MD:              Medicines:                Propofol per Anesthesia Complications:            No immediate complications. Estimated Blood Loss:     Estimated blood loss: none. Procedure:                Pre-Anesthesia Assessment:                           - Prior to the procedure, a History and Physical                            was performed, and patient medications and                            allergies were reviewed. The patient's tolerance of                            previous anesthesia was also reviewed. The risks                            and benefits of the procedure and the sedation                            options and risks were discussed with the patient.                            All questions were answered, and informed consent                            was obtained. Prior Anticoagulants: The patient has                            taken no previous anticoagulant or antiplatelet                            agents. ASA Grade Assessment: III - A patient with                            severe systemic disease. After reviewing the risks  and benefits, the patient was deemed in                            satisfactory condition to undergo the procedure.                           After obtaining informed consent, the endoscope was                            passed under direct vision. Throughout the                            procedure, the patient's blood pressure, pulse, and                             oxygen saturations were monitored continuously. The                            Endoscope was introduced through the mouth, and                            advanced to the second part of duodenum. The upper                            GI endoscopy was accomplished without difficulty.                            The patient tolerated the procedure well. Scope In: 10:14:27 AM Scope Out: 10:17:56 AM Total Procedure Duration: 0 hours 3 minutes 29 seconds  Findings:      The examined esophagus was normal.      (2) small petechiae found in the gastric antrum. Stomach otherwise       appeared normal.      The duodenal bulb and second portion of the duodenum were normal. Impression:               - Normal esophagus.                           - Gastric petechia(e).                           - Normal duodenal bulb and second portion of the                            duodenum.                           - No specimens collected. Moderate Sedation:      Moderate (conscious) sedation was personally administered by an       anesthesia professional. The following parameters were monitored: oxygen       saturation, heart rate, blood pressure, respiratory rate, EKG, adequacy       of pulmonary ventilation, and response to care. Recommendation:           - Patient has a contact number available for  emergencies. The signs and symptoms of potential                            delayed complications were discussed with the                            patient. Return to normal activities tomorrow.                            Written discharge instructions were provided to the                            patient.                           - Resume previous diet.                           - Continue present medications.                           - Return to my office in 2 months. See Colonoscopy                            report Procedure Code(s):        --- Professional ---                            [phone], Esophagogastroduodenoscopy, flexible,                            transoral; diagnostic, including collection of                            specimen(s) by brushing or washing, when performed                            (separate procedure) Diagnosis Code(s):        --- Professional ---                           K31.89, Other diseases of stomach and duodenum                           R10.13, Epigastric pain CPT copyright 2019 American Medical Association. All rights reserved. The codes documented in this report are preliminary and upon coder review may  be revised to meet current compliance requirements. Gerrit Friends. Drianna Chandran, MD Gennette Pac, MD 10/29/2020 10:56:58 AM This report has been signed electronically. Number of Addenda: 0

## 2020-11-02 LAB — SURGICAL PATHOLOGY

## 2020-11-03 ENCOUNTER — Encounter: Payer: Self-pay | Admitting: Internal Medicine

## 2020-11-05 ENCOUNTER — Encounter (HOSPITAL_COMMUNITY): Payer: Self-pay | Admitting: Internal Medicine

## 2021-01-05 ENCOUNTER — Other Ambulatory Visit: Payer: Self-pay

## 2021-01-05 ENCOUNTER — Ambulatory Visit (INDEPENDENT_AMBULATORY_CARE_PROVIDER_SITE_OTHER): Payer: Medicare Other | Admitting: Gastroenterology

## 2021-01-05 ENCOUNTER — Encounter: Payer: Self-pay | Admitting: Gastroenterology

## 2021-01-05 VITALS — BP 155/78 | HR 72 | Temp 97.3°F | Ht 65.0 in | Wt 342.2 lb

## 2021-01-05 DIAGNOSIS — K219 Gastro-esophageal reflux disease without esophagitis: Secondary | ICD-10-CM

## 2021-01-05 DIAGNOSIS — Z86010 Personal history of colonic polyps: Secondary | ICD-10-CM

## 2021-01-05 DIAGNOSIS — K5900 Constipation, unspecified: Secondary | ICD-10-CM | POA: Diagnosis not present

## 2021-01-05 MED ORDER — DEXLANSOPRAZOLE 60 MG PO CPDR: 60.0000 mg | DELAYED_RELEASE_CAPSULE | Freq: Every day | ORAL | 3 refills | Status: AC

## 2021-01-05 NOTE — Patient Instructions (Signed)
Continue Dexilant once daily.  I recommend Benefiber 2 teaspoons daily, and increase water intake. If you need additional help with bowel movements, let us know!  We will see you in 1 year or sooner if needed.  I enjoyed seeing you again today! As you know, I value our relationship and want to provide genuine, compassionate, and quality care. I welcome your feedback. If you receive a survey regarding your visit,  I greatly appreciate you taking time to fill this out. See you next time!  Annitta Needs, PhD, ANP-BC [facility]

## 2021-01-05 NOTE — Progress Notes (Signed)
Referring Provider: Elease Etienne Primary Care Physician:  Gearlean Alf, PA-C Primary GI: Dr. Gala Romney   Chief Complaint  Patient presents with  . Follow-up    No complaints    HPI:   [name] is a 60 y.o. female presenting today with a history of GERD, fatty liver, in follow-up after EGD/colonoscopy. Surveillance colonoscopy due in 2026 due to history of adenomas. EGD with normal esophagus, gastric petechia, normal duodenum. LFTs normal Nov 2021. At prior visit, she was dealing with N/V/D abdominal pain. This has now resolved. Query gastroenteritis.   Now dealing with constipation. BM once to twice a week. No abdominal pain. Not taking anything to help go. Doesn't feel bloated. Started after N/V/D abdominal pain in Nov/Dec 2021. Doesn't want to try a medication. Doesn't drink enough water. Dicyclomine rare. Dexilant for GERD. Had previously been dealing with bloating, belching, and nausea, but this has resolved.      Past Medical History:  Diagnosis Date  . Arthritis   . Depression   . Diabetes mellitus without complication ([place])   . Fibromyalgia   . GERD (gastroesophageal reflux disease)   . Graves disease    s/p radioactive therapy  . Hypercholesteremia   . Hypertension   . Hypothyroidism   . IBS (irritable bowel syndrome)   . Kidney stone   . Lupus ([place])   . Pancreatitis   . Restless leg   . Sleep apnea   . Uvular swelling    pt states it happened after c-section but since then happens randomly at home.    Past Surgical History:  Procedure Laterality Date  . BILATERAL SALPINGOOPHORECTOMY  2005   [facility] [place] done by Dr. Elonda Husky for AUB, chronic pelvic pain  . CESAREAN SECTION     x2  . CHOLECYSTECTOMY    . COLONOSCOPY  2006   RMR: internal/external hemorrhoids, proctitis  . COLONOSCOPY N/A 02/06/2013   RMR: anal canal hemorrhoids, inadequate prep. Needs screening again in April 2015  . COLONOSCOPY WITH PROPOFOL N/A 12/24/2015   Dr. Gala Romney:  redundant colonic, colonic diverticulosis, cecal AVMs, tubular adenoma, surveillance in 5 years  . COLONOSCOPY WITH PROPOFOL N/A 10/29/2020    non-bleeding internal hemorrhoids, one 6 mm polyp in mid transverse colon, otherwise normal. Tubular adenoma, 5 year surveillance.   . CYSTOSCOPY WITH RETROGRADE PYELOGRAM, URETEROSCOPY AND STENT PLACEMENT Right 01/21/2016   Procedure: RIGHT RETROGRADE, PYLEGRAM, RIGHT URETEROSCOPY LITHOTRIPSY WITH STONE  BASKET EXTRACTION, STENT PLACEMENT;  Surgeon: Ardis Hughs, MD;  Location: [facility];  Service: Urology;  Laterality: Right;  . ESOPHAGOGASTRODUODENOSCOPY (EGD) WITH PROPOFOL N/A 10/29/2020   normal esophagus, gastric petechia, normal duodenum.   Marland Kitchen LAPAROSCOPIC CHOLECYSTECTOMY W/ CHOLANGIOGRAPHY  2004  . POLYPECTOMY  12/24/2015   Procedure: POLYPECTOMY;  Surgeon: Daneil Dolin, MD;  Location: [facility];  Service: Endoscopy;;  . POLYPECTOMY  10/29/2020   Procedure: POLYPECTOMY;  Surgeon: Daneil Dolin, MD;  Location: [facility];  Service: Endoscopy;;  . SUPRACERVICAL ABDOMINAL HYSTERECTOMY  2005    Current Outpatient Medications  Medication Sig Dispense Refill  . aspirin EC 81 MG tablet Take 81 mg by mouth daily.    Marland Kitchen atorvastatin (LIPITOR) 40 MG tablet TAKE 1 TABLET (40 MG TOTAL) BY MOUTH DAILY. (Patient taking differently: Take 20 mg by mouth daily.) 30 tablet 1  . dicyclomine (BENTYL) 20 MG tablet Take 1 tablet (20 mg total) by mouth 2 (two) times daily. (Patient taking differently: Take 20  mg by mouth 2 (two) times daily as needed for spasms.) 20 tablet 0  . DULoxetine (CYMBALTA) 60 MG capsule Take 60 mg by mouth daily.     . fluticasone (FLONASE) 50 MCG/ACT nasal spray Place 2 sprays into the nose daily as needed for rhinitis or allergies.    . hydrochlorothiazide (HYDRODIURIL) 25 MG tablet Take 25 mg by mouth daily.    . hydroxychloroquine (PLAQUENIL) 200 MG tablet Take 200 mg by mouth daily.    Marland Kitchen JANUVIA 100 MG tablet Take 100 mg by  mouth daily.    Marland Kitchen levothyroxine (SYNTHROID) 137 MCG tablet Take 137 mcg by mouth daily before breakfast.     . lisinopril (PRINIVIL,ZESTRIL) 40 MG tablet Take 40 mg by mouth daily.    Marland Kitchen loperamide (IMODIUM) 2 MG capsule Take 1 capsule (2 mg total) by mouth 4 (four) times daily as needed for diarrhea or loose stools. 12 capsule 0  . loratadine (CLARITIN) 10 MG tablet Take 10 mg by mouth daily.    . metFORMIN (GLUCOPHAGE) 500 MG tablet Take 1 tablet (500 mg total) by mouth daily with breakfast. (Patient taking differently: Take 1,000 mg by mouth daily with breakfast.) 30 tablet 1  . methocarbamol (ROBAXIN) 750 MG tablet Take 750 mg by mouth daily as needed (for muscle cramps or pains). Reported on 05/24/2016    . pramipexole (MIRAPEX) 0.125 MG tablet Take 0.125 mg by mouth daily as needed (restles legs). Reported on 05/24/2016    . predniSONE (DELTASONE) 5 MG tablet Take 5 mg by mouth daily as needed (lupus). Reported on 05/24/2016    . sucralfate (CARAFATE) 1 g tablet Take 1 tablet (1 g total) by mouth 4 (four) times daily as needed (upper abdominal pain). 60 tablet 3  . vitamin E 400 UNIT capsule Take 400 Units by mouth daily.    Marland Kitchen dexlansoprazole (DEXILANT) 60 MG capsule Take 1 capsule (60 mg total) by mouth daily. 90 capsule 3   No current facility-administered medications for this visit.    Allergies as of 01/05/2021  . (No Known Allergies)    Family History  Adopted: Yes  Problem Relation Age of Onset  . Colon cancer Father        patient was adopted, but she knows birth father. Believes he had colon cancer  . Cancer Neg Hx     Social History   Socioeconomic History  . Marital status: Divorced    Spouse name: Not on file  . Number of children: Not on file  . Years of education: Not on file  . Highest education level: Not on file  Occupational History  . Not on file  Tobacco Use  . Smoking status: Never Smoker  . Smokeless tobacco: Never Used  . Tobacco comment: Never smoked   Vaping Use  . Vaping Use: Never used  Substance and Sexual Activity  . Alcohol use: No    Alcohol/week: 0.0 standard drinks  . Drug use: No  . Sexual activity: Never    Birth control/protection: Surgical  Other Topics Concern  . Not on file  Social History Narrative  . Not on file   Social Determinants of Health   Financial Resource Strain: Not on file  Food Insecurity: Not on file  Transportation Needs: Not on file  Physical Activity: Not on file  Stress: Not on file  Social Connections: Not on file    Review of Systems: Gen: Denies fever, chills, anorexia. Denies fatigue, weakness, weight loss.  CV: Denies  chest pain, palpitations, syncope, peripheral edema, and claudication. Resp: Denies dyspnea at rest, cough, wheezing, coughing up blood, and pleurisy. GI: see HPI Derm: Denies rash, itching, dry skin Psych: Denies depression, anxiety, memory loss, confusion. No homicidal or suicidal ideation.  Heme: Denies bruising, bleeding, and enlarged lymph nodes.  Physical Exam: BP (!) 155/78   Pulse 72   Temp (!) 97.3 F (36.3 C)   Ht 5\' 5"  (1.651 m)   Wt (!) 342 lb 3.2 oz (155.2 kg)   BMI 56.95 kg/m  General:   Alert and oriented. No distress noted. Pleasant and cooperative.  Head:  Normocephalic and atraumatic. Eyes:  Conjuctiva clear without scleral icterus. Mouth:  Mask in place Abdomen:  +BS, soft, mild epigastric TTP and non-distended. No rebound or guarding. No HSM or masses noted. Msk:  Symmetrical without gross deformities. Normal posture. Extremities:  Without edema. Neurologic:  Alert and  oriented x4 Psych:  Alert and cooperative. Normal mood and affect.  ASSESSMENT: [name] is a 60 y.o. female presenting today with likely episode of gastroenteritis a few months ago now resolved, chronic GERD, recent colonoscopy and EGD on file, for routine follow-up.  Dexilant overall controlling GERD symptoms. Notes infrequent BMs but does not want to try  prescriptive agents. I have asked she start fiber and increase water intake. She is to call if no improvement.  Surveillance colonoscopy in 2026.    PLAN:  Dexilant refilled Return in 1 year Benefiber handout Increase water intake  Annitta Needs, PhD, [facility]

## 2021-01-06 NOTE — Progress Notes (Signed)
CC'ED TO PCP 

## 2021-11-28 ENCOUNTER — Emergency Department (HOSPITAL_COMMUNITY): Payer: Medicare Other

## 2021-11-28 ENCOUNTER — Encounter (HOSPITAL_COMMUNITY): Payer: Self-pay

## 2021-11-28 ENCOUNTER — Emergency Department (HOSPITAL_COMMUNITY)
Admission: EM | Admit: 2021-11-28 | Discharge: 2021-11-28 | Disposition: A | Discharge status: Home / Self Care | Payer: Medicare Other | Attending: Emergency Medicine | Admitting: Emergency Medicine

## 2021-11-28 DIAGNOSIS — Z7984 Long term (current) use of oral hypoglycemic drugs: Secondary | ICD-10-CM | POA: Diagnosis not present

## 2021-11-28 DIAGNOSIS — R0602 Shortness of breath: Secondary | ICD-10-CM | POA: Insufficient documentation

## 2021-11-28 DIAGNOSIS — N201 Calculus of ureter: Secondary | ICD-10-CM | POA: Diagnosis not present

## 2021-11-28 DIAGNOSIS — I10 Essential (primary) hypertension: Secondary | ICD-10-CM | POA: Diagnosis not present

## 2021-11-28 DIAGNOSIS — Z79899 Other long term (current) drug therapy: Secondary | ICD-10-CM | POA: Insufficient documentation

## 2021-11-28 DIAGNOSIS — R1013 Epigastric pain: Secondary | ICD-10-CM | POA: Diagnosis present

## 2021-11-28 DIAGNOSIS — Z7982 Long term (current) use of aspirin: Secondary | ICD-10-CM | POA: Diagnosis not present

## 2021-11-28 DIAGNOSIS — E119 Type 2 diabetes mellitus without complications: Secondary | ICD-10-CM | POA: Insufficient documentation

## 2021-11-28 DIAGNOSIS — Z20822 Contact with and (suspected) exposure to covid-19: Secondary | ICD-10-CM | POA: Insufficient documentation

## 2021-11-28 LAB — CBC WITH DIFFERENTIAL/PLATELET
Abs Immature Granulocytes: 0.02 10*3/uL (ref 0.00–0.07)
Basophils Absolute: 0 10*3/uL (ref 0.0–0.1)
Basophils Relative: 1 %
Eosinophils Absolute: 0.2 10*3/uL (ref 0.0–0.5)
Eosinophils Relative: 3 %
HCT: 40.5 % (ref 36.0–46.0)
Hemoglobin: 13.2 g/dL (ref 12.0–15.0)
Immature Granulocytes: 0 %
Lymphocytes Relative: 26 %
Lymphs Abs: 1.8 10*3/uL (ref 0.7–4.0)
MCH: 29.3 pg (ref 26.0–34.0)
MCHC: 32.6 g/dL (ref 30.0–36.0)
MCV: 90 fL (ref 80.0–100.0)
Monocytes Absolute: 0.7 10*3/uL (ref 0.1–1.0)
Monocytes Relative: 9 %
Neutro Abs: 4.3 10*3/uL (ref 1.7–7.7)
Neutrophils Relative %: 61 %
Platelets: 176 10*3/uL (ref 150–400)
RBC: 4.5 MIL/uL (ref 3.87–5.11)
RDW: 13.2 % (ref 11.5–15.5)
WBC: 7 10*3/uL (ref 4.0–10.5)
nRBC: 0 % (ref 0.0–0.2)

## 2021-11-28 LAB — URINALYSIS, ROUTINE W REFLEX MICROSCOPIC
Bilirubin Urine: NEGATIVE
Glucose, UA: NEGATIVE mg/dL
Hgb urine dipstick: NEGATIVE
Ketones, ur: NEGATIVE mg/dL
Leukocytes,Ua: NEGATIVE
Nitrite: NEGATIVE
Protein, ur: 30 mg/dL — AB
Specific Gravity, Urine: 1.026 (ref 1.005–1.030)
pH: 5 (ref 5.0–8.0)

## 2021-11-28 LAB — COMPREHENSIVE METABOLIC PANEL
ALT: 27 U/L (ref 0–44)
AST: 16 U/L (ref 15–41)
Albumin: 4.2 g/dL (ref 3.5–5.0)
Alkaline Phosphatase: 71 U/L (ref 38–126)
Anion gap: 9 (ref 5–15)
BUN: 13 mg/dL (ref 6–20)
CO2: 26 mmol/L (ref 22–32)
Calcium: 9 mg/dL (ref 8.9–10.3)
Chloride: 105 mmol/L (ref 98–111)
Creatinine, Ser: 0.65 mg/dL (ref 0.44–1.00)
GFR, Estimated: >60 mL/min (ref >60)
Glucose, Bld: 129 mg/dL — ABNORMAL HIGH (ref 70–99)
Potassium: 3.6 mmol/L (ref 3.5–5.1)
Sodium: 140 mmol/L (ref 135–145)
Total Bilirubin: 0.7 mg/dL (ref 0.3–1.2)
Total Protein: 7.4 g/dL (ref 6.5–8.1)

## 2021-11-28 LAB — RESP PANEL BY RT-PCR (FLU A&B, COVID) ARPGX2
Influenza A by PCR: NEGATIVE
Influenza B by PCR: NEGATIVE
SARS Coronavirus 2 by RT PCR: NEGATIVE

## 2021-11-28 LAB — TROPONIN I (HIGH SENSITIVITY): Troponin I (High Sensitivity): 4 ng/L (ref <18)

## 2021-11-28 LAB — LIPASE, BLOOD: Lipase: 32 U/L (ref 11–51)

## 2021-11-28 LAB — D-DIMER, QUANTITATIVE: D-Dimer, Quant: 0.68 ug/mL-FEU — ABNORMAL HIGH (ref 0.00–0.50)

## 2021-11-28 MED ORDER — MORPHINE SULFATE (PF) 4 MG/ML IV SOLN
4.0000 mg | Freq: Once | INTRAVENOUS | Status: AC
  Administered 2021-11-28: 4 mg via INTRAVENOUS
  Filled 2021-11-28: qty 1

## 2021-11-28 MED ORDER — OXYCODONE-ACETAMINOPHEN 5-325 MG PO TABS
1.0000 | ORAL_TABLET | Freq: Three times a day (TID) | ORAL | 0 refills | Status: AC | PRN
Start: 2021-11-28 — End: 2021-12-01

## 2021-11-28 MED ORDER — ONDANSETRON 4 MG PO TBDP: 4.0000 mg | ORAL_TABLET | Freq: Three times a day (TID) | ORAL | 0 refills | Status: DC | PRN

## 2021-11-28 MED ORDER — SUCRALFATE 1 G PO TABS: 1.0000 g | ORAL_TABLET | Freq: Three times a day (TID) | ORAL | 0 refills | Status: DC

## 2021-11-28 MED ORDER — TAMSULOSIN HCL 0.4 MG PO CAPS: 0.4000 mg | ORAL_CAPSULE | Freq: Every day | ORAL | 0 refills | Status: AC

## 2021-11-28 MED ORDER — SODIUM CHLORIDE 0.9 % IV BOLUS
1000.0000 mL | Freq: Once | INTRAVENOUS | Status: AC
  Administered 2021-11-28: 1000 mL via INTRAVENOUS

## 2021-11-28 MED ORDER — IOHEXOL 350 MG/ML SOLN
100.0000 mL | Freq: Once | INTRAVENOUS | Status: AC | PRN
  Administered 2021-11-28: 100 mL via INTRAVENOUS

## 2021-11-28 NOTE — ED Provider Notes (Signed)
[facility] DEPT Provider Note   CSN: [id number] Arrival date & time: 11/28/21  1133     History  Chief Complaint  Patient presents with   Shortness of Breath   Abdominal Pain    [name] is a 61 y.o. female with past medical history of hyperlipidemia, hypertension, diabetes mellitus, systemic lupus erythematous, morbid obesity, obstructive sleep apnea, GERD, status postcholecystectomy, status post hysterectomy.  Patient presents to the ED emergency department with a chief plaint of shortness of breath and abdominal pain.  Patient states that her shortness of breath started on Thursday.  Shortness of breath has been intermittent since then.  Patient states that shortness of breath is only present when she is active.  Patient has no shortness of breath at rest.  Patient denies any associated chest pain, palpitations, lightheadedness, or hemoptysis.  Patient states that she developed a minimally productive cough yesterday.  Patient describes mucus that is clear in color.  Patient reports that she went to her PCP on 1/19 and had chest x-ray.  Patient reports that abdominal pain started on Wednesday.  Pain is gotten progressively worse since then.  Pain has been constant.  Pain originally was located to her right flank however has gradually moved to her epigastric area.  Patient rates pain 6/10 on the pain scale.  Patient has not tried any modalities to alleviate her symptoms.  Patient states that pain is worse when laying flat and with p.o. intake.  Patient describes pain as "someone hit me with a sledgehammer."  Patient endorses 1 episode of diarrhea on Thursday.  Patient reports she has been vaccinated for COVID-19 and influenza.  Denies any known sick contacts.  Denies any EtOH or illicit drug use.  Patient denies any fever, chills, nausea, vomiting, aspiration, blood in stool, melena, abdominal distention, dysuria, hematuria, urinary urgency, vaginal pain,  vaginal bleeding, vaginal discharge, leg swelling or tenderness, history of DVT/PE, hormone therapy, cancer history, recent surgery last 4 weeks.   Shortness of Breath Associated symptoms: abdominal pain and cough   Associated symptoms: no chest pain, no fever, no headaches, no neck pain, no rash, no sore throat and no vomiting   Abdominal Pain Associated symptoms: cough, diarrhea and shortness of breath   Associated symptoms: no chest pain, no chills, no constipation, no dysuria, no fever, no hematuria, no nausea, no sore throat, no vaginal bleeding, no vaginal discharge and no vomiting       Home Medications Prior to Admission medications   Medication Sig Start Date End Date Taking? Authorizing Provider  aspirin EC 81 MG tablet Take 81 mg by mouth daily.    [provider]  atorvastatin (LIPITOR) 40 MG tablet TAKE 1 TABLET (40 MG TOTAL) BY MOUTH DAILY. Patient taking differently: Take 20 mg by mouth daily. 03/19/14   Lysbeth Penner, FNP  dexlansoprazole (DEXILANT) 60 MG capsule Take 1 capsule (60 mg total) by mouth daily. 01/05/21   Annitta Needs, NP  dicyclomine (BENTYL) 20 MG tablet Take 1 tablet (20 mg total) by mouth 2 (two) times daily. Patient taking differently: Take 20 mg by mouth 2 (two) times daily as needed for spasms. 09/30/20   Horton, Barbette Hair, MD  DULoxetine (CYMBALTA) 60 MG capsule Take 60 mg by mouth daily.     [provider]  fluticasone (FLONASE) 50 MCG/ACT nasal spray Place 2 sprays into the nose daily as needed for rhinitis or allergies.    [provider]  hydrochlorothiazide (HYDRODIURIL)  25 MG tablet Take 25 mg by mouth daily.    [provider]  hydroxychloroquine (PLAQUENIL) 200 MG tablet Take 200 mg by mouth daily.    [provider]  JANUVIA 100 MG tablet Take 100 mg by mouth daily. 08/29/20   [provider]  levothyroxine (SYNTHROID) 137 MCG tablet Take 137 mcg by mouth daily before breakfast.      [provider]  lisinopril (PRINIVIL,ZESTRIL) 40 MG tablet Take 40 mg by mouth daily.    [provider]  loperamide (IMODIUM) 2 MG capsule Take 1 capsule (2 mg total) by mouth 4 (four) times daily as needed for diarrhea or loose stools. 09/30/20   Horton, Barbette Hair, MD  loratadine (CLARITIN) 10 MG tablet Take 10 mg by mouth daily.    [provider]  metFORMIN (GLUCOPHAGE) 500 MG tablet Take 1 tablet (500 mg total) by mouth daily with breakfast. Patient taking differently: Take 1,000 mg by mouth daily with breakfast. 03/07/13   Chipper Herb, MD  methocarbamol (ROBAXIN) 750 MG tablet Take 750 mg by mouth daily as needed (for muscle cramps or pains). Reported on 05/24/2016    [provider]  pramipexole (MIRAPEX) 0.125 MG tablet Take 0.125 mg by mouth daily as needed (restles legs). Reported on 05/24/2016    [provider]  predniSONE (DELTASONE) 5 MG tablet Take 5 mg by mouth daily as needed (lupus). Reported on 05/24/2016    [provider]  sucralfate (CARAFATE) 1 g tablet Take 1 tablet (1 g total) by mouth 4 (four) times daily as needed (upper abdominal pain). 10/07/20   Carlis Stable, NP  vitamin E 400 UNIT capsule Take 400 Units by mouth daily.    [provider]      Allergies    Patient has no known allergies.    Review of Systems   Review of Systems  Constitutional:  Negative for chills and fever.  HENT:  Positive for congestion. Negative for rhinorrhea and sore throat.   Eyes:  Negative for visual disturbance.  Respiratory:  Positive for cough and shortness of breath.   Cardiovascular:  Negative for chest pain, palpitations and leg swelling.  Gastrointestinal:  Positive for abdominal pain and diarrhea. Negative for abdominal distention, anal bleeding, blood in stool, constipation, nausea, rectal pain and vomiting.  Genitourinary:  Negative for decreased urine volume, difficulty urinating, dysuria, flank pain, frequency,  genital sores, hematuria, pelvic pain, urgency, vaginal bleeding, vaginal discharge and vaginal pain.  Musculoskeletal:  Negative for back pain and neck pain.  Skin:  Negative for color change and rash.  Neurological:  Negative for dizziness, syncope, light-headedness and headaches.  Psychiatric/Behavioral:  Negative for confusion.    Physical Exam Updated Vital Signs BP (!) 147/89    Pulse 68    Temp 98 F (36.7 C) (Oral)    Resp 18    SpO2 97%  Physical Exam Vitals and nursing note reviewed.  Constitutional:      General: She is not in acute distress.    Appearance: She is morbidly obese. She is not ill-appearing, toxic-appearing or diaphoretic.  HENT:     Head: Normocephalic.  Eyes:     General: No scleral icterus.       Right eye: No discharge.        Left eye: No discharge.  Cardiovascular:     Rate and Rhythm: Normal rate.     Pulses:          Radial pulses  are 2+ on the right side and 2+ on the left side.  Pulmonary:     Effort: Pulmonary effort is normal. No tachypnea, bradypnea or respiratory distress.     Breath sounds: Normal breath sounds. No stridor.     Comments: Speaks in full sentences without difficulty Abdominal:     General: Abdomen is protuberant. Bowel sounds are normal. There is no distension. There are no signs of injury.     Palpations: Abdomen is soft. There is no mass or pulsatile mass.     Tenderness: There is abdominal tenderness in the epigastric area and periumbilical area. There is no right CVA tenderness, left CVA tenderness, guarding or rebound.     Comments: Abdominal exam was complicated by patient's body habitus  Musculoskeletal:     Right lower leg: Normal.     Left lower leg: Normal.  Skin:    General: Skin is warm and dry.  Neurological:     General: No focal deficit present.     Mental Status: She is alert.  Psychiatric:        Behavior: Behavior is cooperative.    ED Results / Procedures / Treatments   Labs (all labs ordered are  listed, but only abnormal results are displayed) Labs Reviewed  COMPREHENSIVE METABOLIC PANEL - Abnormal; Notable for the following components:      Result Value   Glucose, Bld 129 (*)    All other components within normal limits  URINALYSIS, ROUTINE W REFLEX MICROSCOPIC - Abnormal; Notable for the following components:   Protein, ur 30 (*)    Bacteria, UA RARE (*)    All other components within normal limits  D-DIMER, QUANTITATIVE - Abnormal; Notable for the following components:   D-Dimer, Quant 0.68 (*)    All other components within normal limits  RESP PANEL BY RT-PCR (FLU A&B, COVID) ARPGX2  CBC WITH DIFFERENTIAL/PLATELET  LIPASE, BLOOD  TROPONIN I (HIGH SENSITIVITY)    EKG None  Radiology CT Angio Chest PE W and/or Wo Contrast  Result Date: 11/28/2021 CLINICAL DATA:  PE suspected, shortness of breath EXAM: CT ANGIOGRAPHY CHEST WITH CONTRAST TECHNIQUE: Multidetector CT imaging of the chest was performed using the standard protocol during bolus administration of intravenous contrast. Multiplanar CT image reconstructions and MIPs were obtained to evaluate the vascular anatomy. RADIATION DOSE REDUCTION: This exam was performed according to the departmental dose-optimization program which includes automated exposure control, adjustment of the mA and/or kV according to patient size and/or use of iterative reconstruction technique. CONTRAST:  130mL OMNIPAQUE IOHEXOL 350 MG/ML SOLN COMPARISON:  None. FINDINGS: Cardiovascular: Satisfactory opacification of the pulmonary arteries to the segmental level. No evidence of pulmonary embolism. Cardiomegaly. Three-vessel coronary artery calcifications. No pericardial effusion. Scattered aortic atherosclerosis. Mediastinum/Nodes: No enlarged mediastinal, hilar, or axillary lymph nodes. Thyroid gland, trachea, and esophagus demonstrate no significant findings. Lungs/Pleura: Lungs are clear. Trace right pleural effusion. Bibasilar interlobular septal  thickening. Upper Abdomen: Please see separately reported examination of the abdomen and pelvis. Musculoskeletal: No chest wall abnormality. No acute osseous findings. Review of the MIP images confirms the above findings. IMPRESSION: 1. Negative examination for pulmonary embolism. 2. Trace right pleural effusion with bibasilar interlobular septal thickening, consistent with pulmonary edema. 3. Cardiomegaly with three-vessel coronary artery calcifications. Aortic Atherosclerosis (ICD10-I70.0). Electronically Signed   By: Delanna Ahmadi M.D.   On: 11/28/2021 14:30   CT ABDOMEN PELVIS W CONTRAST  Result Date: 11/28/2021 CLINICAL DATA:  Patient with mid abdominal pain. EXAM: CT ABDOMEN AND PELVIS  WITH CONTRAST TECHNIQUE: Multidetector CT imaging of the abdomen and pelvis was performed using the standard protocol following bolus administration of intravenous contrast. RADIATION DOSE REDUCTION: This exam was performed according to the departmental dose-optimization program which includes automated exposure control, adjustment of the mA and/or kV according to patient size and/or use of iterative reconstruction technique. CONTRAST:  155mL OMNIPAQUE IOHEXOL 350 MG/ML SOLN COMPARISON:  CT abdomen pelvis 09/30/2020. FINDINGS: Lower chest: Normal heart size. Dependent atelectasis right lower lobe. No pleural effusion. Hepatobiliary: Liver is normal in size and contour. Prior cholecystectomy. No intrahepatic or extrahepatic biliary ductal dilatation. Pancreas: Unremarkable Spleen: Unremarkable Adrenals/Urinary Tract: Normal adrenal glands. Kidneys enhance symmetrically with contrast. There is a 3 mm stone within the mid right ureter (image 55; series 9). No hydronephrosis. Urinary bladder is unremarkable. Stomach/Bowel: Sigmoid colonic diverticulosis. No CT evidence for acute diverticulitis. Normal appendix. No abnormal bowel wall thickening or evidence for bowel obstruction. No free fluid or free intraperitoneal air.  Vascular/Lymphatic: Normal caliber abdominal aorta. Peripheral calcified atherosclerotic plaque. No retroperitoneal lymphadenopathy. Reproductive: Uterus is surgically absent. Grossly unchanged cystic density measuring approximally 4.6 cm within the left adnexa (image 64; series 14). Other: None. Musculoskeletal: Lumbar spine degenerative changes. No aggressive or acute appearing osseous lesions. IMPRESSION: There is a 3 mm stone within the mid right ureter. No hydronephrosis. Electronically Signed   By: Lovey Newcomer M.D.   On: 11/28/2021 14:59   DG CHEST PORT 1 VIEW  Result Date: 11/28/2021 CLINICAL DATA:  Shortness of breath EXAM: PORTABLE CHEST 1 VIEW COMPARISON:  Chest x-ray 05/08/2013 FINDINGS: Heart size and mediastinal contours are within normal limits. No suspicious pulmonary opacities identified. No pleural effusion or pneumothorax visualized. No acute osseous abnormality appreciated. IMPRESSION: No acute intrathoracic process identified. Electronically Signed   By: Ofilia Neas M.D.   On: 11/28/2021 12:56    Procedures Procedures    Medications Ordered in ED Medications  morphine 4 MG/ML injection 4 mg (4 mg Intravenous Given 11/28/21 1218)  sodium chloride 0.9 % bolus 1,000 mL (0 mLs Intravenous Stopped 11/28/21 1505)  iohexol (OMNIPAQUE) 350 MG/ML injection 100 mL (100 mLs Intravenous Contrast Given 11/28/21 1402)  morphine 4 MG/ML injection 4 mg (4 mg Intravenous Given 11/28/21 1504)    ED Course/ Medical Decision Making/ A&P                           Medical Decision Making Amount and/or Complexity of Data Reviewed Labs: ordered. Radiology: ordered.  Risk Prescription drug management.   This patient presents to the ED for concern of exertional shortness of breath and abdominal pain, this involves an extensive number of treatment options, and is a complaint that carries with it a high risk of complications and morbidity.  The differential diagnosis includes but is not  limited to unstable angina, ACS, acute pancreatitis, gastroenteritis, 19, influenza.   Co morbidities that complicate the patient evaluation  Morbid obesity, SLE, hypertension, hyperlipidemia   Additional history obtained:  External records from outside source obtained and reviewed including previous provider notes, lab work, and imaging   Lab Tests:  I Ordered, and personally interpreted labs.  The pertinent results include:   D-dimer elevated 0.68 Troponin 4 CMP, CBC, lipase unremarkable UA shows no signs of infection COVID-19 and influenza negative   Imaging Studies ordered:  I ordered imaging studies including chest x-ray, CTA chest, CT abdomen pelvis with contrast I independently visualized and interpreted imaging which showed  Chest  x-ray shows no active cardiopulmonary disease CT abdomen pelvis shows 3 mm right mid ureteral stone with no associated hydronephrosis CTA chest shows no PE.  Trace right pleural effusion with bibasilar interlobular septal thickening consistent with pulmonary edema. I agree with the radiologist interpretation   Cardiac Monitoring:  The patient was maintained on a cardiac monitor.  I personally viewed and interpreted the cardiac monitored which showed an underlying rhythm of: Sinus rhythm   Medicines ordered and prescription drug management:  I ordered medication including morphine for pain management Reevaluation of the patient after these medicines showed that the patient improved I have reviewed the patients home medicines and have made adjustments as needed   Problem List / ED Course:  Abdominal pain Patient's tenderness on exam CT abdomen pelvis was obtained.  3 mm right ureteral stone with no signs of hydronephrosis found. We will start patient on Flomax, given short course of Percocet.  Patient will follow-up with [facility]. Patient currently on PPI for GERD.  We will add Carafate.  Patient to follow-up with her  gastroenterologist. Shortness of breath with exertion Troponin within normal limits, EKG shows sinus rhythm.  Patient denies any associated chest pain with exertion.  Low suspicion for ACS at this time D-dimer was elevated, CTA shows no signs of PE.  Trace right pleural effusion noted.   Patient is currently on HCTZ however has not taken this medication in the last few days.  Patient advised to continue taking HCTZ as prescribed and follow-up closely with PCP. Patient's shortness of breath exertion amatory referral to cardiology was placed.   Reevaluation:  After the interventions noted above, I reevaluated the patient and found that they have :improved   Disposition:  After consideration of the diagnostic results and the patients response to treatment, I feel that the patent would benefit from discharge and follow-up with cardiology, PCP, gastroenterology, and neurology..          Final Clinical Impression(s) / ED Diagnoses Final diagnoses:  SOB (shortness of breath)  Ureteral stone  Epigastric pain    Rx / DC Orders ED Discharge Orders          Ordered    ondansetron (ZOFRAN-ODT) 4 MG disintegrating tablet  Every 8 hours PRN        11/28/21 1526    sucralfate (CARAFATE) 1 g tablet  3 times daily with meals & bedtime        11/28/21 1526    tamsulosin (FLOMAX) 0.4 MG CAPS capsule  Daily after breakfast        11/28/21 1526    oxyCODONE-acetaminophen (PERCOCET/ROXICET) 5-325 MG tablet  Every 8 hours PRN        11/28/21 1526              Dyann Ruddle 11/28/21 1742    Milton Ferguson, MD 12/01/21 [phone]

## 2021-11-28 NOTE — ED Notes (Addendum)
Before ambulating, pt's resting pulse remained at 75 bpm. After thirty seconds of ambulating pt's pulse reached 110 bpm.

## 2021-11-28 NOTE — ED Triage Notes (Signed)
Pt arrived via POV, c/o SOB, worsening with ambulation as well as middle abd pain. Denies any other sx.

## 2021-11-28 NOTE — Discharge Instructions (Addendum)
You came to the emergency department today to be evaluated for your shortness of breath and abdominal pain.  The CT scan of your abdomen showed that you have a 3 mm kidney stone.  Due to this she was started on Flomax and given pain medication.  Please follow-up with [facility] for further management of your kidney stone.  The CTA of your chest showed that you had a small amount of fluid in your lungs.  It did not show any signs of blood clots.  Due to your shortness of breath with exertion I have placed a consult to have you follow-up with cardiology.  If you do not hear from cardiology in the next few business days to schedule an appointment please call the number listed on this paperwork.  Also please follow-up with your primary care provider.  Get help right away if: Your pain does not go away as soon as your health care provider told you to expect. You cannot stop vomiting. Your pain is only in areas of the abdomen, such as the right side or the left lower portion of the abdomen. Pain on the right side could be caused by appendicitis. You have bloody or black stools, or stools that look like tar. You have severe pain, cramping, or bloating in your abdomen. You have signs of dehydration, such as: Dark urine, very little urine, or no urine. Cracked lips. Dry mouth. Sunken eyes. Sleepiness. Weakness. You have trouble breathing or chest pain. You have a fever or chills. You develop severe pain. You develop new abdominal pain. You faint. You are unable to urinate.

## 2021-12-11 NOTE — Progress Notes (Signed)
Cardiology Office Note:    Date:  12/11/2021   ID:  [name], DOB 05/30/1961, MRN [id number]  PCP:  Heather Roberts, NP   [facility] Providers Cardiologist:  Alverda Skeans, MD Referring MD: Bethann Berkshire, MD   Chief Complaint/Reason for Referral: Dyspnea  ASSESSMENT:    Dyspnea, unspecified type  Type 2 diabetes mellitus without complication, without long-term current use of insulin (HCC)  Hypertension, unspecified type  Hyperlipidemia, unspecified hyperlipidemia type  BMI 40.0-44.9, adult (HCC)    PLAN:    In order of problems listed above:  1.  This is likely multifactorial including elements of hypertension and body habitus.  We will obtain a echocardiogram and coronary CTA since the patient has multivessel coronary artery calcium.  While I believe the patient's shortness of breath is likely due to her weight gain cannot exclude the possibility of obstructive coronary artery disease as contributing.  Follow up 6 months.  2.  Continue aspirin, statin, lisinopril.  Consider SGLT2 inhibitor.  3.  Start amlodipine 10 mg.  4.  This is being followed by her primary care provider.  Goal LDL is less than 70.  5.  We will refer to pharmacy for recommendations regarding pharmacotherapy.   Dispo:  No follow-ups on file.     Medication Adjustments/Labs and Tests Ordered: Current medicines are reviewed at length with the patient today.  Concerns regarding medicines are outlined above.   Tests Ordered: No orders of the defined types were placed in this encounter.   Medication Changes: No orders of the defined types were placed in this encounter.   History of Present Illness:    FOCUSED CARDIOVASCULAR PROBLEM LIST:   1.  Type 2 diabetes 2.  Hyperlipidemia 3.  Hypertension 4.  Lupus 5.  Elevated BMI 6.  Obstructive sleep apnea on CPAP 7.  Lupus 8.  Hypothyroidism 9.  Three-vessel coronary artery calcification on CT scan   The patient is a 61 y.o.  female with the indicated medical history here for emergency room follow-up.  The patient was seen recently in the emergency department due to abdominal pain and shortness of breath.  She was found to have a kidney stone.  She underwent a PE protocol CT which was negative.  Cardiac biomarkers were negative.  An EKG demonstrated sinus rhythm with anterior infarct pattern which was unchanged versus prior.  She is also being discharged on pain medicines and Flomax.  She was referred for shortness of breath.  She tells me she has had increasing shortness of breath over the last month or so.  About 3 months ago she gained about 25 pounds and she is unsure how this happened.  She would quite short of breath when she exerts herself.  This is occasionally associated with some chest tightness.  Prior to her gaining this weight she did not have the symptoms.        Previous Medical History: Past Medical History:  Diagnosis Date   Arthritis    Depression    Diabetes mellitus without complication (HCC)    Fibromyalgia    GERD (gastroesophageal reflux disease)    Graves disease    s/p radioactive therapy   Hypercholesteremia    Hypertension    Hypothyroidism    IBS (irritable bowel syndrome)    Kidney stone    Lupus (HCC)    Pancreatitis    Restless leg    Sleep apnea    Uvular swelling    pt states it happened after  c-section but since then happens randomly at home.     Current Medications: No outpatient medications have been marked as taking for the 12/13/21 encounter (Appointment) with Orbie Pyo, MD.     Allergies:    Patient has no known allergies.   Social History:   Social History   Tobacco Use   Smoking status: Never   Smokeless tobacco: Never   Tobacco comments:    Never smoked  Vaping Use   Vaping Use: Never used  Substance Use Topics   Alcohol use: No    Alcohol/week: 0.0 standard drinks   Drug use: No     Family Hx: Family History  Adopted: Yes  Problem  Relation Age of Onset   Colon cancer Father        patient was adopted, but she knows birth father. Believes he had colon cancer   Cancer Neg Hx      Review of Systems:   Please see the history of present illness.    All other systems reviewed and are negative.     EKGs/Labs/Other Test Reviewed:    EKG:    Prior CV studies: None available  Imaging studies that I have independently reviewed today:   CT 1/23 1. Negative examination for pulmonary embolism. 2. Trace right pleural effusion with bibasilar interlobular septal thickening, consistent with pulmonary edema. 3. Cardiomegaly with three-vessel coronary artery calcifications.  Recent Labs: 11/28/2021: ALT 27; BUN 13; Creatinine, Ser 0.65; Hemoglobin 13.2; Platelets 176; Potassium 3.6; Sodium 140   Recent Lipid Panel No results found for: CHOL, TRIG, HDL, LDLCALC, LDLDIRECT  Risk Assessment/Calculations:          Physical Exam:    VS:  There were no vitals taken for this visit.   Wt Readings from Last 3 Encounters:  01/05/21 (!) 342 lb 3.2 oz (155.2 kg)  10/27/20 (!) 338 lb (153.3 kg)  10/07/20 (!) 334 lb 9.6 oz (151.8 kg)    GENERAL:  No apparent distress, AOx3 HEENT:  No carotid bruits, +2 carotid impulses, no scleral icterus CAR: RRR no murmurs, gallops, rubs, or thrills RES:  Clear to auscultation bilaterally ABD:  Soft, nontender, nondistended, positive bowel sounds x 4 VASC:  +2 radial pulses, +2 carotid pulses, palpable pedal pulses NEURO:  CN 2-12 grossly intact; motor and sensory grossly intact PSYCH:  No active depression or anxiety EXT:  No edema, ecchymosis, or cyanosis  Signed, Orbie Pyo, MD  12/11/2021 6:54 AM    [facility] [address] [place], [place], [place]  [address] Phone: [phone]; Fax: [phone]   Note:  This document was prepared using Dragon voice recognition software and may include unintentional dictation errors.

## 2021-12-13 ENCOUNTER — Other Ambulatory Visit: Payer: Self-pay

## 2021-12-13 ENCOUNTER — Encounter: Payer: Self-pay | Admitting: Internal Medicine

## 2021-12-13 ENCOUNTER — Ambulatory Visit (INDEPENDENT_AMBULATORY_CARE_PROVIDER_SITE_OTHER): Payer: Medicare Other | Admitting: Internal Medicine

## 2021-12-13 VITALS — BP 188/94 | HR 72 | Ht 65.0 in | Wt 374.0 lb

## 2021-12-13 DIAGNOSIS — E119 Type 2 diabetes mellitus without complications: Secondary | ICD-10-CM

## 2021-12-13 DIAGNOSIS — E785 Hyperlipidemia, unspecified: Secondary | ICD-10-CM | POA: Diagnosis not present

## 2021-12-13 DIAGNOSIS — Z6841 Body Mass Index (BMI) 40.0 and over, adult: Secondary | ICD-10-CM

## 2021-12-13 DIAGNOSIS — I10 Essential (primary) hypertension: Secondary | ICD-10-CM | POA: Diagnosis not present

## 2021-12-13 DIAGNOSIS — R0600 Dyspnea, unspecified: Secondary | ICD-10-CM

## 2021-12-13 LAB — BASIC METABOLIC PANEL
BUN/Creatinine Ratio: 19 (ref 9–23)
BUN: 13 mg/dL (ref 6–24)
CO2: 27 mmol/L (ref 20–29)
Calcium: 9.5 mg/dL (ref 8.7–10.2)
Chloride: 105 mmol/L (ref 96–106)
Creatinine, Ser: 0.7 mg/dL (ref 0.57–1.00)
Glucose: 96 mg/dL (ref 70–99)
Potassium: 3.7 mmol/L (ref 3.5–5.2)
Sodium: 142 mmol/L (ref 134–144)
eGFR: 100 mL/min/{1.73_m2} (ref >59)

## 2021-12-13 LAB — TSH+FREE T4
Free T4: 1.65 ng/dL (ref 0.82–1.77)
TSH: 7.64 u[IU]/mL — ABNORMAL HIGH (ref 0.450–4.500)

## 2021-12-13 MED ORDER — METOPROLOL TARTRATE 100 MG PO TABS: ORAL_TABLET | ORAL | 0 refills | Status: DC

## 2021-12-13 MED ORDER — AMLODIPINE BESYLATE 10 MG PO TABS: 10.0000 mg | ORAL_TABLET | Freq: Every day | ORAL | 3 refills | Status: DC

## 2021-12-13 NOTE — Patient Instructions (Addendum)
Medication Instructions:  1) START Amlodipine 10mg  once daily  *If you need a refill on your cardiac medications before your next appointment, please call your pharmacy*   Lab Work: BMET and TSH/T4 today  If you have labs (blood work) drawn today and your tests are completely normal, you will receive your results only by: [place] (if you have MyChart) OR A paper copy in the mail If you have any lab test that is abnormal or we need to change your treatment, we will call you to review the results.   Testing/Procedures: Your physician has requested that you have an echocardiogram. Echocardiography is a painless test that uses sound waves to create images of your heart. It provides your doctor with information about the size and shape of your heart and how well your hearts chambers and valves are working. This procedure takes approximately one hour. There are no restrictions for this procedure.  Your physician recommends that you have a Coronary CT performed.  The CT scheduler from the hospital will contact you to get this scheduled.  See next two pages for instructions.   Follow-Up:  Your physician recommends that you schedule a follow-up appointment next available with the Pharmacy team.  At [facility], you and your health needs are our priority.  As part of our continuing mission to provide you with exceptional heart care, we have created designated Provider Care Teams.  These Care Teams include your primary Cardiologist (physician) and Advanced Practice Providers (APPs -  Physician Assistants and Nurse Practitioners) who all work together to provide you with the care you need, when you need it.  We recommend signing up for the patient portal called "MyChart".  Sign up information is provided on this After Visit Summary.  MyChart is used to connect with patients for Virtual Visits (Telemedicine).  Patients are able to view lab/test results, encounter notes, upcoming  appointments, etc.  Non-urgent messages can be sent to your provider as well.   To learn more about what you can do with MyChart, go to [email or web address].    Your next appointment:   6 month(s)  The format for your next appointment:   In Person  Provider:   Lenna Sciara, MD    Other Instructions    Your cardiac CT will be scheduled at one of the below locations:   [facility] [address] [place], [place] [address] [phone]  [facility] [address] [place], [place] [address] [phone]  If scheduled at [facility], please arrive at the [facility] main entrance (entrance A) of [facility] 30 minutes prior to test start time. You can use the FREE valet parking offered at the main entrance (encouraged to control the heart rate for the test) Proceed to the [facility] Radiology Department (first floor) to check-in and test prep.  If scheduled at [facility], please arrive 15 mins early for check-in and test prep.  Please follow these instructions carefully (unless otherwise directed):   On the Night Before the Test: Be sure to Drink plenty of water. Do not consume any caffeinated/decaffeinated beverages or chocolate 12 hours prior to your test. Do not take any antihistamines 12 hours prior to your test.  On the Day of the Test: Drink plenty of water until 1 hour prior to the test. Do not eat any food 4 hours prior to the test. You may take your regular medications prior to  the test.  Take metoprolol (Lopressor) two hours prior to test. HOLD Furosemide/Hydrochlorothiazide morning of the test. FEMALES- please wear underwire-free bra if available, avoid dresses & tight clothing       After the Test: Drink plenty of water. After receiving IV contrast, you may experience a mild flushed feeling. This is normal. On occasion, you may experience a mild  rash up to 24 hours after the test. This is not dangerous. If this occurs, you can take Benadryl 25 mg and increase your fluid intake. If you experience trouble breathing, this can be serious. If it is severe call 911 IMMEDIATELY. If it is mild, please call our office. If you take any of these medications: Glipizide/Metformin, Avandament, Glucavance, please do not take 48 hours after completing test unless otherwise instructed.  We will call to schedule your test 2-4 weeks out understanding that some insurance companies will need an authorization prior to the service being performed.   For non-scheduling related questions, please contact the cardiac imaging nurse navigator should you have any questions/concerns: Marchia Bond, Cardiac Imaging Nurse Navigator Gordy Clement, Cardiac Imaging Nurse Navigator [facility] Direct Office Dial: [phone]   For scheduling needs, including cancellations and rescheduling, please call [place], [phone].

## 2021-12-24 ENCOUNTER — Ambulatory Visit: Payer: Medicare Other | Admitting: Internal Medicine

## 2021-12-27 ENCOUNTER — Other Ambulatory Visit: Payer: Self-pay

## 2021-12-27 ENCOUNTER — Ambulatory Visit (HOSPITAL_COMMUNITY): Payer: Medicare Other | Attending: Cardiovascular Disease

## 2021-12-27 DIAGNOSIS — R0600 Dyspnea, unspecified: Secondary | ICD-10-CM | POA: Diagnosis not present

## 2021-12-27 LAB — ECHOCARDIOGRAM COMPLETE
Area-P 1/2: 3.26 cm2
S' Lateral: 2.8 cm

## 2021-12-27 MED ORDER — PERFLUTREN LIPID MICROSPHERE
1.0000 mL | INTRAVENOUS | Status: AC | PRN
  Administered 2021-12-27: 3 mL via INTRAVENOUS

## 2022-01-03 ENCOUNTER — Telehealth (HOSPITAL_COMMUNITY): Payer: Self-pay | Admitting: Emergency Medicine

## 2022-01-03 NOTE — Telephone Encounter (Signed)
Reaching out to patient to offer assistance regarding upcoming cardiac imaging study; pt verbalizes understanding of appt date/time, parking situation and where to check in, pre-test NPO status and medications ordered, and verified current allergies; name and call back number provided for further questions should they arise Marchia Bond RN Navigator Cardiac Imaging [facility] [phone] office [phone] cell  Denies iv issues 100mg  metoprolol tart  Holding HCTZ Arrival 800  Additionally - denies issues lying flat on back for 47min, denies shoulder mobility issues

## 2022-01-04 ENCOUNTER — Ambulatory Visit (HOSPITAL_COMMUNITY)
Admission: RE | Admit: 2022-01-04 | Discharge: 2022-01-04 | Disposition: A | Discharge status: Home / Self Care | Payer: Medicare Other | Source: Ambulatory Visit | Attending: Cardiology | Admitting: Cardiology

## 2022-01-04 ENCOUNTER — Encounter (HOSPITAL_COMMUNITY): Payer: Self-pay

## 2022-01-04 ENCOUNTER — Other Ambulatory Visit: Payer: Self-pay | Admitting: Cardiology

## 2022-01-04 ENCOUNTER — Ambulatory Visit (HOSPITAL_COMMUNITY)
Admission: RE | Admit: 2022-01-04 | Discharge: 2022-01-04 | Disposition: A | Discharge status: Home / Self Care | Payer: Medicare Other | Source: Ambulatory Visit | Attending: Internal Medicine | Admitting: Internal Medicine

## 2022-01-04 ENCOUNTER — Other Ambulatory Visit: Payer: Self-pay

## 2022-01-04 DIAGNOSIS — R931 Abnormal findings on diagnostic imaging of heart and coronary circulation: Secondary | ICD-10-CM

## 2022-01-04 DIAGNOSIS — R0600 Dyspnea, unspecified: Secondary | ICD-10-CM | POA: Insufficient documentation

## 2022-01-04 DIAGNOSIS — I2510 Atherosclerotic heart disease of native coronary artery without angina pectoris: Secondary | ICD-10-CM | POA: Diagnosis not present

## 2022-01-04 MED ORDER — IOHEXOL 350 MG/ML SOLN
100.0000 mL | Freq: Once | INTRAVENOUS | Status: AC | PRN
  Administered 2022-01-04: 100 mL via INTRAVENOUS

## 2022-01-04 MED ORDER — NITROGLYCERIN 0.4 MG SL SUBL
SUBLINGUAL_TABLET | SUBLINGUAL | Status: AC
  Filled 2022-01-04: qty 2

## 2022-01-04 MED ORDER — NITROGLYCERIN 0.4 MG SL SUBL
0.8000 mg | SUBLINGUAL_TABLET | Freq: Once | SUBLINGUAL | Status: AC
  Administered 2022-01-04: 0.8 mg via SUBLINGUAL

## 2022-01-06 ENCOUNTER — Other Ambulatory Visit: Payer: Self-pay

## 2022-01-06 ENCOUNTER — Ambulatory Visit (INDEPENDENT_AMBULATORY_CARE_PROVIDER_SITE_OTHER): Payer: Medicare Other | Admitting: Pharmacist

## 2022-01-06 DIAGNOSIS — I10 Essential (primary) hypertension: Secondary | ICD-10-CM | POA: Diagnosis not present

## 2022-01-06 MED ORDER — OZEMPIC (0.25 OR 0.5 MG/DOSE) 2 MG/1.5ML ~~LOC~~ SOPN: 0.5000 mg | PEN_INJECTOR | SUBCUTANEOUS | 1 refills | Status: DC

## 2022-01-06 NOTE — Patient Instructions (Addendum)
Please continue amlodipine 10mg  daily, lisinopril 40mg  daily, hydrochlorothiazide 25mg  daily.  Try to add in a daily exercise. Start with 5-10 min and increase as able. I have referred you to the [facility] prep class  STOP Januvia Start Ozempic 0.25mg  once a week   GLP-1 Receptor Agonist Counseling Points This medication reduces your appetite and may make you feel fuller longer.  Stop eating when your body tells you that you are full. This will likely happen sooner than you are used to. Store your medication in the fridge until you are ready to use it. Inject your medication in the fatty tissue of your lower abdominal area (2 inches away from belly button) or upper outer thigh. Rotate injection sites. Each pen will last you about 1 month (the first month it will last a few weeks longer). Use a different needle with each weekly injection. Common side effects include: nausea, diarrhea/constipation, and heartburn, and are more likely to occur if you overeat.  Dosing schedule: - Month 1: Inject 0.25mg  subcutaneously once weekly for 4 weeks - Month 2: Inject 0.5mg  subcutaneously once weekly for 4 weeks - Month 3: Inject 1mg  subcutaneously once weekly for 4 weeks - Month 4: Inject 2mg  subcutaneously once weekly  Tips for living a healthier life     Building a Healthy and Balanced Diet Make most of your meal vegetables and fruits -  of your plate. Aim for color and variety, and remember that potatoes dont count as vegetables on the Healthy Eating Plate because of their negative impact on blood sugar.  Go for whole grains -  of your plate. Whole and intact grains--whole wheat, barley, wheat berries, quinoa, oats, brown rice, and foods made with them, such as whole wheat pasta--have a milder effect on blood sugar and insulin than white bread, white rice, and other refined grains.  Protein power -  of your plate. Fish, poultry, beans, and nuts are all healthy, versatile protein sources--they  can be mixed into salads, and pair well with vegetables on a plate. Limit red meat, and avoid processed meats such as bacon and sausage.  Healthy plant oils - in moderation. Choose healthy vegetable oils like olive, canola, soy, corn, sunflower, peanut, and others, and avoid partially hydrogenated oils, which contain unhealthy trans fats. Remember that low-fat does not mean healthy.  Drink water, coffee, or tea. Skip sugary drinks, limit milk and dairy products to one to two servings per day, and limit juice to a small glass per day.  Stay active. The red figure running across the [place] is a reminder that staying active is also important in weight control.  The main message of the Healthy Eating Plate is to focus on diet quality:  The type of carbohydrate in the diet is more important than the amount of carbohydrate in the diet, because some sources of carbohydrate--like vegetables (other than potatoes), fruits, whole grains, and beans--are healthier than others. The Healthy Eating Plate also advises consumers to avoid sugary beverages, a major source of calories--usually with little nutritional value--in the American diet. The Healthy Eating Plate encourages consumers to use healthy oils, and it does not set a maximum on the percentage of calories people should get each day from healthy sources of fat. In this way, the Healthy Eating Plate recommends the opposite of the low-fat message promoted for decades by the USDA.  [email or web address].no  SUGAR  Sugar is a huge problem in the modern day diet. Sugar is a big contributor to  heart disease, diabetes, high triglyceride levels, fatty liver disease and obesity. Sugar is hidden in almost all packaged foods/beverages. Added sugar is extra sugar that is added beyond what is naturally found and has no nutritional benefit for your body. The American Heart Association recommends  limiting added sugars to no more than 25g for women and 36 grams for men per day. There are many names for sugar including maltose, sucrose (names ending in "ose"), high fructose corn syrup, molasses, cane sugar, corn sweetener, raw sugar, syrup, honey or fruit juice concentrate.   One of the best ways to limit your added sugars is to stop drinking sweetened beverages such as soda, sweet tea, and fruit juice.  There is 65g of added sugars in one 20oz bottle of Coke! That is equal to 7.5 donuts.   Pay attention and read all nutrition facts labels. Below is an examples of a nutrition facts label. The #1 is showing you the total sugars where the # 2 is showing you the added sugars. This one serving has almost the max amount of added sugars per day!     20 oz Soda 65g Sugar = 7.5 Glazed Donuts  16oz Energy  Drink 54g Sugar = 6.5 Glazed Donuts  Large Sweet  Tea 38g Sugar = 4 Glazed Donuts  20oz Sports  Drink 34g Sugar = 3.5 Glazed Donuts  8oz Chocolate Milk 24g Sugar =2.5 Glazed Donuts  8oz Orange  Juice 21g Sugar = 2 Glazed Donuts  1 Juice Box 14g Sugar = 1.5 Glazed Donuts  16oz Water= NO SUGAR!!  EXERCISE  Exercise is good. Weve all heard that. In an ideal world, we would all have time and resources to get plenty of it. When you are active, your heart pumps more efficiently and you will feel better.  Multiple studies show that even walking regularly has benefits that include living a longer life. The American Heart Association recommends 150 minutes per week of exercise (30 minutes per day most days of the week). You can do this in any increment you wish. Nine or more 10-minute walks count. So does an hour-long exercise class. Break the time apart into what will work in your life. Some of the best things you can do include walking briskly, jogging, cycling or swimming laps. Not everyone is ready to exercise. Sometimes we need to start with just getting active. Here are some  easy ways to be more active throughout the day:  Take the stairs instead of the elevator  Go for a 10-15 minute walk during your lunch break (find a friend to make it more enjoyable)  When shopping, park at the back of the parking lot  If you take public transportation, get off one stop early and walk the extra distance  Pace around while making phone calls  Check with your doctor if you arent sure what your limitations may be. Always remember to drink plenty of water when doing any type of exercise. Dont feel like a failure if youre not getting the 90-150 minutes per week. If you started by being a couch potato, then just a 10-minute walk each day is a huge improvement. Start with little victories and work your way up.   HEALTHY EATING TIPS  When looking to improve your eating habits, whether to lose weight, lower blood pressure or just be healthier, it helps to know what a serving size is.   Grains 1 slice of bread,  bagel,  cup pasta or rice  Vegetables  1 cup fresh or raw vegetables,  cup cooked or canned Fruits 1 piece of medium sized fruit,  cup canned,   Meats/Proteins  cup dried       1 oz meat, 1 egg,  cup cooked beans, nuts or seeds  Dairy        Fats Individual yogurt container, 1 cup (8oz)    1 teaspoon margarine/butter or vegetable  milk or milk alternative, 1 slice of cheese          oil; 1 tablespoon mayonnaise or salad dressing                  Plan ahead: make a menu of the meals for a week then create a grocery list to go with that menu. Consider meals that easily stretch into a night of leftovers, such as stews or casseroles. Or consider making two of your favorite meal and put one in the freezer for another night. Try a night or two each week that is meatless or no cook such as salads. When you get home from the grocery store wash and prepare your vegetables and fruits. Then when you need them they are ready to go.   Tips for going to the grocery store:   [place] store or generic brands  Check the weekly ad from your store on-line or in their in-store flyer  Look at the unit price on the shelf tag to compare/contrast the costs of different items  Buy fruits/vegetables in season  Carrots, bananas and apples are low-cost, naturally healthy items  If meats or frozen vegetables are on sale, buy some extras and put in your freezer  Limit buying prepared or ready to eat items, even if they are pre-made salads or fruit snacks  Do not shop when youre hungry  Foods at eye level tend to be more expensive. Look on the high and low shelves for deals.  Consider shopping at the farmers market for fresh foods in season.  Avoid the cookie and chip aisles (these are expensive, high in calories and low in nutritional value). Shop on the outside of the grocery store.  Healthy food preparations:  If you cant get lean hamburger, be sure to drain the fat when cooking  Steam, saut (in olive oil), grill or bake foods  Experiment with different seasonings to avoid adding salt to your foods. Kosher salt, sea salt and Himalayan salt are all still salt and should be avoided. Try seasoning food with onion, garlic, thyme, rosemary, basil ect. Onion powder or garlic powder is ok. Avoid if it says salt (ie garlic salt).

## 2022-01-06 NOTE — Progress Notes (Signed)
Patient ID: [name]                 DOB: Jul 20, 1961                      MRN: [id number] ? ? ? ? ?HPI: ?[name] is a 61 y.o. female referred by Dr. Lynnette Caffey to HTN clinic. PMH is significant for DM, HTN, HLD and obesity. Patient saw Dr. Lynnette Caffey on 12/13/21 for SOB. BP was 188/94. She was started on amlodipine 10mg  daily. ? ?Patient presents today to HTN clinic. She has not been checking her blood pressure at home. She does not have a cuff and thinks she goes to the MD enough that she doesn't need to check at home. Upper arm is too big for large cuff in office. Her BP was 132/82 when she was at her [facility] MD. She denies dizziness, lightheadedness, headache, blurred vision, SOB and swelling. Does not take NSAIDs. Does not really exercise. Drives a school bus. ? ?Patient also has questions about GLP-1. Does not feel like she eats a lot. Does not eat lunch. Goes through spurts where sometimes she craves sweets, crunchy things. But other times not really. Does not drink much water at all. More lemonade. Grew up eating "meat and potatoes" ? ?Current HTN meds: amlodipine 10mg  daily, lisinopril 40mg  daily, hydrochlorothiazide 25mg  daily ?Previously tried:  ?BP goal: <130/80 ? ?Family History:  ?Family History  ?Adopted: Yes  ?Problem Relation Age of Onset  ? Colon cancer Father   ?     patient was adopted, but she knows birth father. Believes he had colon cancer  ? Cancer Neg Hx   ? ? ?Social History: never smoked, no ETOH, no illict drugs  ? ?Diet: breakfast: egg on english muffin w/ cheese ?Lunch: none ?Dinner: meat (ground beef, venison, pork chops, chicken) and potatoes and vegetable (broccoli, cauliflower, corn, carrot), pasta (less than before) ?Snack: fruit here and there, sometime she has a sweet kick ?Drinks: pink lemonade, decaf tea, canberry juice, very little water ? ?Exercise: walks her dog sometimes ? ?Home BP readings:  ? 132/82 (sleep apnea Dr.)  ? ? ?Wt Readings from Last 3 Encounters:  ?12/13/21 (!)  374 lb (169.6 kg)  ?01/05/21 (!) 342 lb 3.2 oz (155.2 kg)  ?10/27/20 (!) 338 lb (153.3 kg)  ? ?BP Readings from Last 3 Encounters:  ?01/04/22 116/64  ?12/13/21 (!) 188/94  ?11/28/21 (!) 190/68  ? ?Pulse Readings from Last 3 Encounters:  ?01/04/22 62  ?12/13/21 72  ?11/28/21 (!) 58  ? ? ?Renal function: ?CrCl cannot be calculated (Patient's most recent lab result is older than the maximum 21 days allowed.). ? ?Past Medical History:  ?Diagnosis Date  ? Arthritis   ? Depression   ? Diabetes mellitus without complication (HCC)   ? Fibromyalgia   ? GERD (gastroesophageal reflux disease)   ? Graves disease   ? s/p radioactive therapy  ? Hypercholesteremia   ? Hypertension   ? Hypothyroidism   ? IBS (irritable bowel syndrome)   ? Kidney stone   ? Lupus (HCC)   ? Pancreatitis   ? Restless leg   ? Sleep apnea   ? Uvular swelling   ? pt states it happened after c-section but since then happens randomly at home.  ? ? ?Current Outpatient Medications on File Prior to Visit  ?Medication Sig Dispense Refill  ? albuterol (VENTOLIN HFA) 108 (90 Base) MCG/ACT inhaler Inhale 2 puffs into the  lungs daily as needed for shortness of breath.    ? amLODipine (NORVASC) 10 MG tablet Take 1 tablet (10 mg total) by mouth daily. 90 tablet 3  ? aspirin EC 81 MG tablet Take 81 mg by mouth daily.    ? atorvastatin (LIPITOR) 40 MG tablet TAKE 1 TABLET (40 MG TOTAL) BY MOUTH DAILY. 30 tablet 1  ? dexlansoprazole (DEXILANT) 60 MG capsule Take 1 capsule (60 mg total) by mouth daily. 90 capsule 3  ? FLUoxetine (PROZAC) 40 MG capsule Take 40 mg by mouth daily.    ? hydrochlorothiazide (HYDRODIURIL) 25 MG tablet Take 25 mg by mouth daily.    ? hydroxychloroquine (PLAQUENIL) 200 MG tablet Take 200 mg by mouth daily.    ? JANUVIA 100 MG tablet Take 100 mg by mouth daily.    ? levothyroxine (SYNTHROID) 137 MCG tablet Take 137 mcg by mouth daily before breakfast.     ? lisinopril (PRINIVIL,ZESTRIL) 40 MG tablet Take 40 mg by mouth daily.    ? metFORMIN  (GLUCOPHAGE) 500 MG tablet Take 1 tablet (500 mg total) by mouth daily with breakfast. (Patient taking differently: Take 500 mg by mouth 2 (two) times daily with a meal.) 30 tablet 1  ? metoprolol tartrate (LOPRESSOR) 100 MG tablet Take one tablet by mouth 2 hours prior to CT 1 tablet 0  ? pramipexole (MIRAPEX) 0.125 MG tablet Take 0.125 mg by mouth daily as needed (restles legs). Reported on 05/24/2016    ? RESTASIS 0.05 % ophthalmic emulsion Place 1 drop into both eyes daily as needed for dry eyes.    ? vitamin E 400 UNIT capsule Take 400 Units by mouth daily.    ? ?No current facility-administered medications on file prior to visit.  ? ? ?No Known Allergies ? ?There were no vitals taken for this visit. ? ? ?Assessment/Plan: ? ?1. Hypertension - Blood pressure well controlled in clinic today, at goal of <130/80. Taken on right wrist. No home readings available. Patient not really interested in checking at home. We did talk about the importance of having home readings to know if her BP is being consistently well controlled. Continue amlodipine 10mg  daily, lisinopril 40mg  daily and hydrochlorothiazide 25mg  daily. Follow up as needed. Patient referred to the [facility] prep-class and encouraged to increase physical activity. Benefits on BP,blood sugar and mood discussed. I again encouraged her to check BP at home and call us if >130/80. ? ?2. DM and Weight loss-  Patient on medicare/medicaid.They do not pay for "weight loss medications" but do pay for GLP-1 for DM. Patient is a good candidate for Ozempic- she has DM, is over weight and she has CAD (per her CTA w/ FFR). We discussed the side effects (including nausea, vomiting, diarrhea, constipation and dyspepsia) and CV benefit of Ozempic. She is adopted but does not know if any personal or family hx of MTC or MEN 2. She has had pancreatitis. I did warn her that this medication could put her at increased risk of another attack. She stated that she accepted the risk and it  was worth it to her. No hx of gallstones (does not have gallbladder) and no hx of DM retinopathy. I demonstrated how to use the pen and reviewed the dose titration. Rx sent to pharmacy. We talked about the importance of making sure she doesn't eat too large of a meal. Eating smaller more frequent meals to make sure she gets enough protein (1g/kg). Cut out sugars drinks and limit pasta  and potatoes. I encouraged exercise - starting with cardio and adding in resistance training to assure that she does not loose to much lean body mass/bone. She has been referred to the [facility] prep class. I will follow up with patient in ~ 1 month via telephone. Stop Januvia. ? ?Thank you ? ?Olene Floss, Pharm.D, BCPS, CPP ?[facility]  ?[address], [place], [place] [address]  ?Phone: [phone]; Fax: [phone]  ? ? ?

## 2022-01-07 ENCOUNTER — Telehealth: Payer: Self-pay

## 2022-01-07 NOTE — Telephone Encounter (Signed)
Called to discuss PREP program referral; left voicemail. 

## 2022-01-07 NOTE — Telephone Encounter (Signed)
She returned my call, wants to attend late mornings at [facility]; next class will most likely start mid-late April; will contact later this month with [facility] schedule once dates/times are set.  ?

## 2022-01-24 ENCOUNTER — Encounter: Payer: Self-pay | Admitting: Internal Medicine

## 2022-02-03 ENCOUNTER — Telehealth: Payer: Self-pay | Admitting: Pharmacist

## 2022-02-03 ENCOUNTER — Encounter (INDEPENDENT_AMBULATORY_CARE_PROVIDER_SITE_OTHER): Payer: Self-pay

## 2022-02-03 NOTE — Telephone Encounter (Signed)
Calling patient to f/u on how she is going on Ozempic. ?LVM for pt to return call ?

## 2022-02-10 NOTE — Telephone Encounter (Signed)
Patient doing well on Ozempic 0.'25mg'$ . She has completed all 4 injections and will increase to 0.'5mg'$  weekly. Just a little indigestion. Will start [facility] prep [facility] mid August. Doing a little more exercise at home. ?F/u in 4 weeks. ?

## 2022-02-16 ENCOUNTER — Other Ambulatory Visit: Payer: Self-pay | Admitting: Pharmacist

## 2022-02-16 MED ORDER — OZEMPIC (0.25 OR 0.5 MG/DOSE) 2 MG/3ML ~~LOC~~ SOPN: 0.5000 mg | PEN_INJECTOR | SUBCUTANEOUS | 0 refills | Status: DC

## 2022-02-22 ENCOUNTER — Ambulatory Visit: Payer: Medicare Other | Admitting: Internal Medicine

## 2022-03-03 ENCOUNTER — Telehealth: Payer: Self-pay

## 2022-03-03 NOTE — Telephone Encounter (Signed)
Called to confirm participation in next PREP class at [name] on May 9, left voicemail ?

## 2022-03-07 ENCOUNTER — Telehealth: Payer: Self-pay

## 2022-03-07 NOTE — Telephone Encounter (Signed)
Called to confirm participation in next PREP class starting May 9 and to set up assessment visit this week; left voicemail ?

## 2022-03-08 ENCOUNTER — Ambulatory Visit (INDEPENDENT_AMBULATORY_CARE_PROVIDER_SITE_OTHER): Payer: Medicare Other | Admitting: Internal Medicine

## 2022-03-08 ENCOUNTER — Encounter: Payer: Self-pay | Admitting: Internal Medicine

## 2022-03-08 VITALS — BP 118/62 | HR 61 | Temp 97.8°F | Ht 65.0 in | Wt 324.4 lb

## 2022-03-08 DIAGNOSIS — R197 Diarrhea, unspecified: Secondary | ICD-10-CM

## 2022-03-08 NOTE — Progress Notes (Signed)
? ? ?Primary Care Physician:  Noreene Larsson, NP ?Primary Gastroenterologist:  Dr.  Marland Kitchen ?Pre-Procedure History & Physical: ?HPI:  [name] is a 61 y.o. female here for follow-up of GERD and IBS.  Previously constipated predominant amount of time.  Now, diarrhea about half the time.  Almost always postprandial.  However, may go a day or 2 without a bowel movement.  No bleeding.  Adenomatous colon polyp removed previously; due for surveillance 2026. ? ?GERD well-controlled on Dexilant.  No dysphagia.  She has lost 42 pounds trying to become more physically fit.  On Ozempic now.  Also, on metformin. ?Past Medical History:  ?Diagnosis Date  ? Arthritis   ? Depression   ? Diabetes mellitus without complication ([place])   ? Fibromyalgia   ? GERD (gastroesophageal reflux disease)   ? Graves disease   ? s/p radioactive therapy  ? Hypercholesteremia   ? Hypertension   ? Hypothyroidism   ? IBS (irritable bowel syndrome)   ? Kidney stone   ? Lupus ([place])   ? Pancreatitis   ? Restless leg   ? Sleep apnea   ? Uvular swelling   ? pt states it happened after c-section but since then happens randomly at home.  ? ? ?Past Surgical History:  ?Procedure Laterality Date  ? BILATERAL SALPINGOOPHORECTOMY  2005  ? [facility] [place] done by Dr. Elonda Husky for AUB, chronic pelvic pain  ? CESAREAN SECTION    ? x2  ? CHOLECYSTECTOMY    ? COLONOSCOPY  2006  ? RMR: internal/external hemorrhoids, proctitis  ? COLONOSCOPY N/A 02/06/2013  ? RMR: anal canal hemorrhoids, inadequate prep. Needs screening again in April 2015  ? COLONOSCOPY WITH PROPOFOL N/A 12/24/2015  ? Dr. Gala Romney: redundant colonic, colonic diverticulosis, cecal AVMs, tubular adenoma, surveillance in 5 years  ? COLONOSCOPY WITH PROPOFOL N/A 10/29/2020  ?  non-bleeding internal hemorrhoids, one 6 mm polyp in mid transverse colon, otherwise normal. Tubular adenoma, 5 year surveillance.   ? CYSTOSCOPY WITH RETROGRADE PYELOGRAM, URETEROSCOPY AND STENT PLACEMENT Right 01/21/2016  ? Procedure: RIGHT  RETROGRADE, PYLEGRAM, RIGHT URETEROSCOPY LITHOTRIPSY WITH STONE  BASKET EXTRACTION, STENT PLACEMENT;  Surgeon: Ardis Hughs, MD;  [facility]: [facility];  Service: Urology;  Laterality: Right;  ? ESOPHAGOGASTRODUODENOSCOPY (EGD) WITH PROPOFOL N/A 10/29/2020  ? normal esophagus, gastric petechia, normal duodenum.   ? LAPAROSCOPIC CHOLECYSTECTOMY W/ CHOLANGIOGRAPHY  2004  ? POLYPECTOMY  12/24/2015  ? Procedure: POLYPECTOMY;  Surgeon: Daneil Dolin, MD;  [facility];  Service: Endoscopy;;  ? POLYPECTOMY  10/29/2020  ? Procedure: POLYPECTOMY;  Surgeon: Daneil Dolin, MD;  [facility];  Service: Endoscopy;;  ? SUPRACERVICAL ABDOMINAL HYSTERECTOMY  2005  ? ? ?Prior to Admission medications   ?Medication Sig Start Date End Date Taking? Authorizing Provider  ?albuterol (VENTOLIN HFA) 108 (90 Base) MCG/ACT inhaler Inhale 2 puffs into the lungs daily as needed for shortness of breath. 11/25/21  Yes [provider]  ?amLODipine (NORVASC) 10 MG tablet Take 1 tablet (10 mg total) by mouth daily. 12/13/21  Yes Early Osmond, MD  ?aspirin EC 81 MG tablet Take 81 mg by mouth daily.   Yes [provider]  ?atorvastatin (LIPITOR) 40 MG tablet TAKE 1 TABLET (40 MG TOTAL) BY MOUTH DAILY. 03/19/14  Yes Lysbeth Penner, FNP  ?dexlansoprazole (DEXILANT) 60 MG capsule Take 1 capsule (60 mg total) by mouth daily. 01/05/21  Yes Annitta Needs, NP  ?FLUoxetine (PROZAC) 40 MG capsule Take 40  mg by mouth daily.   Yes [provider]  ?hydrochlorothiazide (HYDRODIURIL) 25 MG tablet Take 25 mg by mouth daily.   Yes [provider]  ?hydroxychloroquine (PLAQUENIL) 200 MG tablet Take 200 mg by mouth daily.   Yes [provider]  ?levothyroxine (SYNTHROID) 137 MCG tablet Take 137 mcg by mouth daily before breakfast.    Yes [provider]  ?lisinopril (PRINIVIL,ZESTRIL) 40 MG tablet Take 40 mg by mouth daily.   Yes [provider]  ?metFORMIN (GLUCOPHAGE) 500 MG  tablet Take 1 tablet (500 mg total) by mouth daily with breakfast. ?Patient taking differently: Take 500 mg by mouth 2 (two) times daily with a meal. 03/07/13  Yes Chipper Herb, MD  ?pramipexole (MIRAPEX) 0.125 MG tablet Take 0.125 mg by mouth daily as needed (restles legs). Reported on 05/24/2016   Yes [provider]  ?RESTASIS 0.05 % ophthalmic emulsion Place 1 drop into both eyes daily as needed for dry eyes. 08/04/21  Yes [provider]  ?Semaglutide,0.25 or 0.'5MG'$ /DOS, (OZEMPIC, 0.25 OR 0.5 MG/DOSE,) 2 MG/3ML SOPN Inject 0.5 mg into the skin once a week. 02/16/22  Yes Early Osmond, MD  ?vitamin E 400 UNIT capsule Take 400 Units by mouth daily.   Yes [provider]  ? ? ?Allergies as of 03/08/2022  ? (No Known Allergies)  ? ? ?Family History  ?Adopted: Yes  ?Problem Relation Age of Onset  ? Colon cancer Father   ?     patient was adopted, but she knows birth father. Believes he had colon cancer  ? Cancer Neg Hx   ? ? ?Social History  ? ?Socioeconomic History  ? Marital status: Divorced  ?  Spouse name: Not on file  ? Number of children: Not on file  ? Years of education: Not on file  ? Highest education level: Not on file  ?Occupational History  ? Not on file  ?Tobacco Use  ? Smoking status: Never  ? Smokeless tobacco: Never  ? Tobacco comments:  ?  Never smoked  ?Vaping Use  ? Vaping Use: Never used  ?Substance and Sexual Activity  ? Alcohol use: No  ?  Alcohol/week: 0.0 standard drinks  ? Drug use: No  ? Sexual activity: Never  ?  Birth control/protection: Surgical  ?Other Topics Concern  ? Not on file  ?Social History Narrative  ? Not on file  ? ?Social Determinants of Health  ? ?Financial Resource Strain: Not on file  ?Food Insecurity: Not on file  ?Transportation Needs: Not on file  ?Physical Activity: Not on file  ?Stress: Not on file  ?Social Connections: Not on file  ?Intimate Partner Violence: Not on file  ? ? ?Review of Systems: ?See HPI, otherwise negative  ROS ? ?Physical Exam: ?BP 118/62 (BP Location: Right Arm, Patient Position: Sitting, Cuff Size: Large)   Pulse 61   Temp 97.8 ?F (36.6 ?C) (Oral)   Ht '5\' 5"'$  (1.651 m)   Wt (!) 324 lb 6.4 oz (147.1 kg)   SpO2 98%   BMI 53.98 kg/m?  ?General:   Alert,  Well-developed, well-nourished, pleasant and cooperative in NAD ?Neck:  Supple; no masses or thyromegaly. No significant cervical adenopathy. ?Lungs:  Clear throughout to auscultation.   No wheezes, crackles, or rhonchi. No acute distress. ?Heart:  Regular rate and rhythm; no murmurs, clicks, rubs,  or gallops. ?Abdomen: Obese.  Positive bowel sounds soft no obvious mass organomegaly no  ?pulses:  Normal pulses noted. ?Extremities:  Without clubbing or edema. ? ?Impression/Plan: 61 year old morbidly obese lady with diabetes and multiple other comorbidities.  Chronically altered bowel function consistent with IBS.  More phenotypically now mixed.  Had a lengthy discussion about the treatable but noncurable nature of IBS.  She is not interested in any more prescription pharmacotherapy for IBS at this time.  No alarm features. ? ?GERD well-controlled.  History colonic adenoma; due for surveillance 2026. ? ?She needs a one-time screen for celiac disease ? ? ?Recommendations: ? ? ?Continue Dexilant 60 mg daily for GERD ? ?Information on IBS provided ? ?Try Imodium 2 mg 30 minutes before you eat during times where you have diarrhea after eating. ? ?You will need a repeat colonoscopy in 2026 ? ?We will draw blood for celiac disease to cover all the bases ? ?We will plan to see back in 6 months ? ? ? ? ?Notice: This dictation was prepared with Dragon dictation along with smaller phrase technology. Any transcriptional errors that result from this process are unintentional and may not be corrected upon review.  ?

## 2022-03-08 NOTE — Patient Instructions (Signed)
It was good to see you again today! ? ?Continue Dexilant 60 mg daily for GERD ? ?Information on IBS provided ? ?Try Imodium 2 mg 30 minutes before you eat during times where you have diarrhea after eating. ? ?You will need a repeat colonoscopy in 2026 ? ?We will draw blood for celiac disease to cover all the bases ? ?We will plan to see back in 6 months ?

## 2022-03-09 ENCOUNTER — Telehealth: Payer: Self-pay

## 2022-03-09 ENCOUNTER — Other Ambulatory Visit: Payer: Self-pay | Admitting: Internal Medicine

## 2022-03-09 NOTE — Telephone Encounter (Signed)
She returned my call to confirm she wants to attend PREP at [name] starting May 9; assessment visit scheduled for May 8 at 11:30 ?

## 2022-03-10 LAB — IGA: Immunoglobulin A: 175 mg/dL (ref 47–310)

## 2022-03-10 LAB — TISSUE TRANSGLUTAMINASE, IGA: (tTG) Ab, IgA: <1 U/mL

## 2022-03-10 MED ORDER — OZEMPIC (1 MG/DOSE) 4 MG/3ML ~~LOC~~ SOPN: 1.0000 mg | PEN_INJECTOR | SUBCUTANEOUS | 0 refills | Status: DC

## 2022-03-10 NOTE — Addendum Note (Signed)
Addended by: Marcelle Overlie D on: 03/10/2022 11:57 AM ? ? Modules accepted: Orders ? ?

## 2022-03-10 NOTE — Telephone Encounter (Signed)
Called patient to follow up on how she is doing on ozempic. Start she is doing well. I asked about her IBS. She states it is a little worse but she is managing it. I advised that the ozempic could be making it worse. She still wishes to increase the dose to '1mg'$ . She is cutting back on sugary drinks and will start the [facility] prep class next week. ?I advised her to call me if her diarrhea gets worse. Will call in '1mg'$  ozempic. ?F/u in 4 weeks. ? ?

## 2022-03-14 NOTE — Progress Notes (Signed)
[facility] PREP Evaluation ? ?Patient Details  ?Name: AREE SMITS ?MRN: [id number] ?Date of Birth: 04/27/1961 ?Age: 61 y.o. ?PCP: Heather Roberts, NP ? ?Vitals:  ? 03/14/22 1154  ?BP: 118/74  ?Pulse: 86  ?SpO2: 94%  ?Weight: (!) 324 lb 12.8 oz (147.3 kg)  ? ? ? [facility] Eval - 03/14/22 1100   ? ?  ? [facility]PREP[facility]  ? [facility]   ?  ? Referral   ? Referring Provider PharmD   ? Reason for referral Diabetes;Hypertension;Inactivity;Obesitity/Overweight   ? Program Start Date 03/15/22   ?  ? Measurement  ? Waist Circumference 57.5 inches   ? Hip Circumference 61 inches   ? Body fat --   E4  ?  ? Information for Trainer  ? Goals --   Establish exercise routine; establish healthy  eating routine  ? Current Exercise --   walking to mailbox and back  ? Orthopedic Concerns --   lupus affects joints, OA, fibromyalgia  ? Pertinent Medical History --   HTN, lupus, pre-diabetes  ?  ? Timed Up and Go (TUGS)  ? Timed Up and Go Low risk <9 seconds   ?  ? Mobility and Daily Activities  ? I find it easy to walk up or down two or more flights of stairs. 3   ? I have no trouble taking out the trash. 4   ? I do housework such as vacuuming and dusting on my own without difficulty. 2   ? I can easily lift a gallon of milk (8lbs). 4   ? I can easily walk a mile. 1   ? I have no trouble reaching into high cupboards or reaching down to pick up something from the floor. 4   ? I do not have trouble doing out-door work such as Loss adjuster, chartered, raking leaves, or gardening. 1   ?  ? Mobility and Daily Activities  ? I feel younger than my age. 2   ? I feel independent. 4   ? I feel energetic. 1   ? I live an active life.  1   ? I feel strong. 2   ? I feel healthy. 2   ? I feel active as other people my age. 1   ?  ? How fit and strong are you.  ? Fit and Strong Total Score 32   ? ?  ?  ? ?  ? ?Past Medical History:  ?Diagnosis Date  ? Arthritis   ? Depression   ? Diabetes mellitus without complication (HCC)   ? Fibromyalgia    ? GERD (gastroesophageal reflux disease)   ? Graves disease   ? s/p radioactive therapy  ? Hypercholesteremia   ? Hypertension   ? Hypothyroidism   ? IBS (irritable bowel syndrome)   ? Kidney stone   ? Lupus (HCC)   ? Pancreatitis   ? Restless leg   ? Sleep apnea   ? Uvular swelling   ? pt states it happened after c-section but since then happens randomly at home.  ? ?Past Surgical History:  ?Procedure Laterality Date  ? BILATERAL SALPINGOOPHORECTOMY  2005  ? [facility] [place] done by Dr. Despina Hidden for AUB, chronic pelvic pain  ? CESAREAN SECTION    ? x2  ? CHOLECYSTECTOMY    ? COLONOSCOPY  2006  ? RMR: internal/external hemorrhoids, proctitis  ? COLONOSCOPY N/A 02/06/2013  ? RMR: anal canal hemorrhoids, inadequate prep. Needs screening again  in April 2015  ? COLONOSCOPY WITH PROPOFOL N/A 12/24/2015  ? Dr. Jena Gauss: redundant colonic, colonic diverticulosis, cecal AVMs, tubular adenoma, surveillance in 5 years  ? COLONOSCOPY WITH PROPOFOL N/A 10/29/2020  ?  non-bleeding internal hemorrhoids, one 6 mm polyp in mid transverse colon, otherwise normal. Tubular adenoma, 5 year surveillance.   ? CYSTOSCOPY WITH RETROGRADE PYELOGRAM, URETEROSCOPY AND STENT PLACEMENT Right 01/21/2016  ? Procedure: RIGHT RETROGRADE, PYLEGRAM, RIGHT URETEROSCOPY LITHOTRIPSY WITH STONE  BASKET EXTRACTION, STENT PLACEMENT;  Surgeon: [name], MD;  [facility];  Service: Urology;  Laterality: Right;  ? ESOPHAGOGASTRODUODENOSCOPY (EGD) WITH PROPOFOL N/A 10/29/2020  ? normal esophagus, gastric petechia, normal duodenum.   ? LAPAROSCOPIC CHOLECYSTECTOMY W/ CHOLANGIOGRAPHY  2004  ? POLYPECTOMY  12/24/2015  ? Procedure: POLYPECTOMY;  Surgeon: Corbin Ade, MD;  [facility];  Service: Endoscopy;;  ? POLYPECTOMY  10/29/2020  ? Procedure: POLYPECTOMY;  Surgeon: Corbin Ade, MD;  [facility];  Service: Endoscopy;;  ? SUPRACERVICAL ABDOMINAL HYSTERECTOMY  2005  ? ?Social History  ? ?Tobacco Use  ?Smoking Status Never  ?Smokeless  Tobacco Never  ?Tobacco Comments  ? Never smoked  ?To begin PREP at [facility] May 9, every T/Th 10-11:15 ? ?Ashley Royalty Charizma Gardiner ?03/14/2022, 11:58 AM ? ? ?

## 2022-03-15 NOTE — Progress Notes (Signed)
[facility] PREP Weekly Session ? ?Patient Details  ?Name: [name] ?MRN: [id number] ?Date of Birth: 21-Jul-1961 ?Age: 61 y.o. ?PCP: Noreene Larsson, NP ? ?There were no vitals filed for this visit. ? ? [facility] Weekly seesion - 03/15/22 1100   ? ?  ? [facility]PREP[facility]  ? [facility]   ?  ? Weekly Session  ? Topic Discussed Goal setting and welcome to the program   Tour of facilty, review of workbook, offered intro to cardio machine  ? Classes attended to date 1   ? ?  ?  ? ?  ? ? ?[name] ?03/15/2022, 11:56 AM ? ? ?

## 2022-03-22 NOTE — Progress Notes (Signed)
[facility] PREP Weekly Session ? ?Patient Details  ?Name: [name] ?MRN: [id number] ?Date of Birth: [birth year] ?Age: 61 y.o. ?PCP: Noreene Larsson, NP ? ?Vitals:  ? 03/22/22 1144  ?Weight: (!) 323 lb (146.5 kg)  ? ? ? [facility] Weekly seesion - 03/22/22 1100   ? ?  ? [facility]  ? [facility]   ?  ? Weekly Session  ? Topic Discussed Importance of resistance training;Other ways to be active   Water: 1/2 body wt in ounces OR 64 oz day and work up to goal; cardio: 150 min/wk, strength: 2-3 times/wk 20-40 minutes  ? Minutes exercised this week 20 minutes   ? Classes attended to date 3   ? ?  ?  ? ?  ? ? ?[place] ?03/22/2022, 11:45 AM ? ? ?

## 2022-03-29 NOTE — Progress Notes (Signed)
[facility] PREP Weekly Session  Patient Details  Name: [name] MRN: [id number] Date of Birth: October 28, 1961 Age: 61 y.o. PCP: Noreene Larsson, NP  Vitals:   03/29/22 1150  Weight: (!) 319 lb 12.8 oz (145.1 kg)     [facility] Weekly seesion - 03/29/22 1100       [facility] "PREP" Location   [facility] "PREP[facility] Location [facility]      Weekly Session   Topic Discussed Healthy eating tips   recipes on bulletin board, YUKA app, eat the rainbow of colors   Minutes exercised this week 275 minutes    Classes attended to date [place] 03/29/2022, 11:51 AM

## 2022-04-05 NOTE — Progress Notes (Signed)
[facility] PREP Weekly Session  Patient Details  Name: [name] MRN: [id number] Date of Birth: 04/14/1961 Age: 61 y.o. PCP: Noreene Larsson, NP  Vitals:   04/05/22 1127  Weight: (!) 318 lb (144.2 kg)     [facility] Weekly seesion - 04/05/22 1100       [facility] "PREP" Location   [facility] "PREP[facility] Location [facility]      Weekly Session   Topic Discussed Health habits   Sugar demo   Minutes exercised this week 175 minutes    Classes attended to date [place] 04/05/2022, 11:28 AM

## 2022-04-07 ENCOUNTER — Telehealth: Payer: Self-pay | Admitting: Pharmacist

## 2022-04-07 MED ORDER — OZEMPIC (2 MG/DOSE) 8 MG/3ML ~~LOC~~ SOPN: 2.0000 mg | PEN_INJECTOR | SUBCUTANEOUS | 11 refills | Status: DC

## 2022-04-07 NOTE — Telephone Encounter (Signed)
Called patient to see how she is doing on Ozempic. Doing well. Some gas. Started the [facility] prep class. Patient ready to increase to '2mg'$ .

## 2022-04-12 ENCOUNTER — Encounter: Payer: Self-pay | Admitting: *Deleted

## 2022-04-12 NOTE — Progress Notes (Signed)
[facility] PREP Weekly Session  Patient Details  Name: [name] MRN: [id number] Date of Birth: May 02, 1961 Age: 61 y.o. PCP: Noreene Larsson, NP  Vitals:   04/12/22 1144  Weight: (!) 313 lb (142 kg)     [facility] Weekly seesion - 04/12/22 1100       [facility] "PREP" Location   [facility] "PREP" Location [facility]      Weekly Session   Topic Discussed Restaurant Eating   Salt demo, reminder to limit salt intake to 1500-'2300mg'$ /day   Minutes exercised this week 190 minutes    Classes attended to date [place], [place] 04/12/2022, 11:44 AM

## 2022-04-21 ENCOUNTER — Encounter: Payer: Self-pay | Admitting: *Deleted

## 2022-04-21 NOTE — Progress Notes (Signed)
[facility] PREP Weekly Session  Patient Details  Name: [name] MRN: [id number] Date of Birth: [birth year] Age: 61 y.o. PCP: Noreene Larsson, NP  Vitals:   04/21/22 1149  Weight: (!) 315 lb (142.9 kg)     [facility] Weekly seesion - 04/21/22 1100       [facility] "PREP" Location   [facility] "PREP" Location [facility]      Weekly Session   Topic Discussed Stress management and problem solving   Finger tip meditation, food bowl guided meditation and tips for better sleep.   Minutes exercised this week 145 minutes    Classes attended to date [place], [place] 04/21/2022, 11:51 AM

## 2022-04-26 ENCOUNTER — Encounter: Payer: Self-pay | Admitting: *Deleted

## 2022-04-26 NOTE — Progress Notes (Signed)
[facility] PREP Weekly Session  Patient Details  Name: [name] MRN: [id number] Date of Birth: [birth year] Age: 60 y.o. PCP: Noreene Larsson, NP  Vitals:   04/26/22 1101  Weight: (!) 314 lb (142.4 kg)     [facility] Weekly seesion - 04/26/22 1100       [facility] "PREP" Location   [facility] "PREP" Location [facility]      Weekly Session   Topic Discussed Expectations and non-scale victories   Half way through program, Review and reset goals.   Minutes exercised this week 150 minutes    Classes attended to date [place], [place] 04/26/2022, 11:06 AM

## 2022-05-03 ENCOUNTER — Encounter: Payer: Self-pay | Admitting: *Deleted

## 2022-05-05 NOTE — Telephone Encounter (Signed)
LVM for pt to call back. Calling to f/u on how she is going with ozempic '2mg'$ . Needs to be scheduled for 6 month visit (Sept). Following up on how [facility] prep class is going and changes she has made.

## 2022-05-11 MED ORDER — OZEMPIC (2 MG/DOSE) 8 MG/3ML ~~LOC~~ SOPN: 2.0000 mg | PEN_INJECTOR | SUBCUTANEOUS | 11 refills | Status: DC

## 2022-05-11 NOTE — Telephone Encounter (Signed)
Spoke with patient. She is doing well. Has been able to implement some changes she learned at [facility] prep class. Scheduled for follow up in office on Sept 6 @ 8:30

## 2022-05-11 NOTE — Addendum Note (Signed)
Addended by: Marcelle Overlie D on: 05/11/2022 12:20 PM   Modules accepted: Orders

## 2022-05-17 ENCOUNTER — Encounter: Payer: Self-pay | Admitting: *Deleted

## 2022-05-17 NOTE — Progress Notes (Signed)
[facility] PREP Weekly Session  Patient Details  Name: [name] MRN: [id number] Date of Birth: 1961/08/13 Age: 61 y.o. PCP: Noreene Larsson, NP  Vitals:   05/17/22 1113  Weight: (!) 307 lb (139.3 kg)     [facility] Weekly seesion - 05/17/22 1100       [facility] "PREP" Location   [facility] "PREP" Location [facility]      Weekly Session   Topic Discussed Finding support   Membership talk by Merrilee Seashore, review of food labels   Minutes exercised this week 70 minutes    Classes attended to date [place], [place] 05/17/2022, 11:16 AM

## 2022-05-24 NOTE — Progress Notes (Signed)
[facility] PREP Weekly Session  Patient Details  Name: ODETH BRY MRN: [id number] Date of Birth: 08-25-1961 Age: 61 y.o. PCP: Noreene Larsson, NP  Vitals:   05/24/22 1145  Weight: (!) 305 lb (138.3 kg)     [facility] Weekly seesion - 05/24/22 1100       [facility] "PREP" Location   [facility] "PREP" Location [facility]      Weekly Session   Topic Discussed Calorie breakdown   Carbs, fats, proteins % recomendations including in depth discuss on difference between simple and complex carbs; review of goals and activity for next 12 weeks.   Minutes exercised this week 165 minutes    Classes attended to date [place] 05/24/2022, 11:46 AM

## 2022-05-31 ENCOUNTER — Encounter: Payer: Self-pay | Admitting: *Deleted

## 2022-05-31 NOTE — Progress Notes (Signed)
[facility] PREP Weekly Session  Patient Details  Name: [name] MRN: [id number] Date of Birth: 1960/12/06 Age: 61 y.o. PCP: Noreene Larsson, NP  Vitals:   05/31/22 1120  Weight: (!) 302 lb (137 kg)     [facility] Weekly seesion - 05/31/22 1100       [facility] "PREP" Location   [facility] "PREP" Location [facility]      Weekly Session   Topic Discussed Hitting roadblocks   Review of goals and activity plan to bring on thursday, Completed FIT testing and How Fit and Strong survey.   Minutes exercised this week 265 minutes    Classes attended to date [place] 05/31/2022, 11:25 AM

## 2022-06-02 NOTE — Progress Notes (Signed)
[facility] PREP Evaluation  Patient Details  Name: [name] MRN: [id number] Date of Birth: 03/20/1961 Age: 61 y.o. PCP: Heather Roberts, NP  Vitals:   06/02/22 0839  BP: 132/76  Pulse: 78  SpO2: 97%  Weight: (!) 301 lb 9.6 oz (136.8 kg)     [facility] Eval - 06/02/22 0800       [facility] "PREP" Location   [facility] "PREP" Location [facility]      Referral    Program Start Date 06/02/22   Program end date     Measurement   Waist Circumference 54.5 inches    Hip Circumference 60.5 inches    Body fat --   E4     Information for Trainer   Goals --   Goal under 300 pounds, then lose another 25 pounds by the end of the year     Mobility and Daily Activities   I find it easy to walk up or down two or more flights of stairs. 3    I have no trouble taking out the trash. 4    I do housework such as vacuuming and dusting on my own without difficulty. 3    I can easily lift a gallon of milk (8lbs). 4    I can easily walk a mile. 3    I have no trouble reaching into high cupboards or reaching down to pick up something from the floor. 4    I do not have trouble doing out-door work such as Loss adjuster, chartered, raking leaves, or gardening. 3      Mobility and Daily Activities   I feel younger than my age. 3    I feel independent. 4    I feel energetic. 3    I live an active life.  2    I feel strong. 3    I feel healthy. 3    I feel active as other people my age. 3      How fit and strong are you.   Fit and Strong Total Score 45            Past Medical History:  Diagnosis Date   Arthritis    Depression    Diabetes mellitus without complication (HCC)    Fibromyalgia    GERD (gastroesophageal reflux disease)    Graves disease    s/p radioactive therapy   Hypercholesteremia    Hypertension    Hypothyroidism    IBS (irritable bowel syndrome)    Kidney stone    Lupus (HCC)    Pancreatitis    Restless leg    Sleep apnea    Uvular swelling    pt states it happened after  c-section but since then happens randomly at home.   Past Surgical History:  Procedure Laterality Date   BILATERAL SALPINGOOPHORECTOMY  2005   [facility] [place] done by Dr. Despina Hidden for AUB, chronic pelvic pain   CESAREAN SECTION     x2   CHOLECYSTECTOMY     COLONOSCOPY  2006   RMR: internal/external hemorrhoids, proctitis   COLONOSCOPY N/A 02/06/2013   RMR: anal canal hemorrhoids, inadequate prep. Needs screening again in April 2015   COLONOSCOPY WITH PROPOFOL N/A 12/24/2015   Dr. Jena Gauss: redundant colonic, colonic diverticulosis, cecal AVMs, tubular adenoma, surveillance in 5 years   COLONOSCOPY WITH PROPOFOL N/A 10/29/2020    non-bleeding internal hemorrhoids, one 6 mm polyp in mid transverse colon, otherwise normal. Tubular adenoma, 5 year surveillance.  CYSTOSCOPY WITH RETROGRADE PYELOGRAM, URETEROSCOPY AND STENT PLACEMENT Right 01/21/2016   Procedure: RIGHT RETROGRADE, PYLEGRAM, RIGHT URETEROSCOPY LITHOTRIPSY WITH STONE  BASKET EXTRACTION, STENT PLACEMENT;  Surgeon: [name], MD;  Location: [facility];  Service: Urology;  Laterality: Right;   ESOPHAGOGASTRODUODENOSCOPY (EGD) WITH PROPOFOL N/A 10/29/2020   normal esophagus, gastric petechia, normal duodenum.    LAPAROSCOPIC CHOLECYSTECTOMY W/ CHOLANGIOGRAPHY  2004   POLYPECTOMY  12/24/2015   Procedure: POLYPECTOMY;  Surgeon: Corbin Ade, MD;  Location: [facility];  Service: Endoscopy;;   POLYPECTOMY  10/29/2020   Procedure: POLYPECTOMY;  Surgeon: Corbin Ade, MD;  Location: [facility];  Service: Endoscopy;;   SUPRACERVICAL ABDOMINAL HYSTERECTOMY  2005   Social History   Tobacco Use  Smoking Status Never  Smokeless Tobacco Never  Tobacco Comments   Never smoked  Wt loss: 23.2 pounds How fit and strong survey: 5/9: 32      7/27: 45 Educations sessions completed: 11 Workout sessions completed: 10   Salsabeel Gorelick B Thailan Sava 06/02/2022, 8:42 AM

## 2022-06-16 ENCOUNTER — Telehealth: Payer: Self-pay | Admitting: Internal Medicine

## 2022-06-16 NOTE — Telephone Encounter (Signed)
Pt c/o medication issue:  1. Name of Medication: Semaglutide, 2 MG/DOSE, (OZEMPIC, 2 MG/DOSE,) 8 MG/3ML SOPN  2. How are you currently taking this medication (dosage and times per day)? Has been out of medication for a week  3. Are you having a reaction (difficulty breathing--STAT)? No   4. What is your medication issue? Patient is calling requesting to speak with Melissa regarding being unable to get this medication. She states she has spoken with several pharmacies, and no one has it. [facility] informed her it is a production back up with the manufacture. Please advise.

## 2022-06-16 NOTE — Telephone Encounter (Signed)
Called pt back and left message.   There are currently supply chain issues and backorders with many GLPs unfortunately. Can try decreasing Ozempic dose back to '1mg'$  weekly if pharmacy is able to get that dose in stock.

## 2022-06-20 NOTE — Telephone Encounter (Signed)
Left another message for pt.

## 2022-06-21 NOTE — Telephone Encounter (Signed)
Called pt again, she states her pharmacy was able to fill Ozempic '2mg'$ , nothing further needed.

## 2022-07-13 ENCOUNTER — Ambulatory Visit: Payer: Medicare Other | Attending: Cardiology | Admitting: Pharmacist

## 2022-07-13 DIAGNOSIS — E119 Type 2 diabetes mellitus without complications: Secondary | ICD-10-CM | POA: Diagnosis not present

## 2022-07-13 DIAGNOSIS — I10 Essential (primary) hypertension: Secondary | ICD-10-CM

## 2022-07-13 NOTE — Progress Notes (Signed)
Patient ID: [name]                 DOB: 05-20-1961                      MRN: [id number]      HPI: [name] is a 61 y.o. female referred by Dr. Lynnette Caffey to HTN clinic. PMH is significant for DM, HTN, HLD and obesity. Patient saw Dr. Lynnette Caffey on 12/13/21 for SOB. BP was 188/94. She was started on amlodipine 10mg  daily. Seen in HTN clinic 01/06/22. BP in clinic was at goal. Patient was not interested in checking at home. Arm too big for upper arm cuff. She was started on Ozempic at that visit. Baseline weight ~344lb. Since then she has completed the [facility] prep class. Now on Ozempic 2mg  weekly.  Patient presents today for 6 month follow up. She states that she has stopped taking the metformin in the AM. Only takes at night. Was feeling dizzy after taking in the AM, feels fine after stopping. A1C has decreased from 6.6 to 5.4. She has not been doing much exercise lately, but plans to join the [facility] and start exercising again. Has a little bit of diarrhea and gas, but otherwise tolerating ozempic well. Has been doing better with diet. Doesn't drink sweetened beverages anymore. Tries to get her vegetable and protein in.   Current DM medications: metformin 500mg  with dinner, Ozempic 2mg  weekly. Current HTN meds: amlodipine 10mg  daily, lisinopril 40mg  daily, hydrochlorothiazide 25mg  daily Previously tried:  BP goal: <130/80  Family History:  Family History  Adopted: Yes  Problem Relation Age of Onset   Colon cancer Father        patient was adopted, but she knows birth father. Believes he had colon cancer   Cancer Neg Hx     Social History: never smoked, no ETOH, no illict drugs   Diet: breakfast: sometimes doesn't eat breakfast, cereal or eggs Lunch: none Dinner: not cooking as much, salads, chicken, veggies Snack: not much, cheese and crackers, apples Drinks: un sweet tea, little more water  Exercise: walks her dog sometimes  Home BP readings:   132/82 (sleep apnea Dr.)    Noland Fordyce  Readings from Last 3 Encounters:  06/02/22 (!) 301 lb 9.6 oz (136.8 kg)  05/31/22 (!) 302 lb (137 kg)  05/24/22 (!) 305 lb (138.3 kg)   BP Readings from Last 3 Encounters:  06/02/22 132/76  03/14/22 118/74  03/08/22 118/62   Pulse Readings from Last 3 Encounters:  06/02/22 78  03/14/22 86  03/08/22 61    Renal function: CrCl cannot be calculated (Patient's most recent lab result is older than the maximum 21 days allowed.).  Past Medical History:  Diagnosis Date   Arthritis    Depression    Diabetes mellitus without complication (HCC)    Fibromyalgia    GERD (gastroesophageal reflux disease)    Graves disease    s/p radioactive therapy   Hypercholesteremia    Hypertension    Hypothyroidism    IBS (irritable bowel syndrome)    Kidney stone    Lupus (HCC)    Pancreatitis    Restless leg    Sleep apnea    Uvular swelling    pt states it happened after c-section but since then happens randomly at home.    Current Outpatient Medications on File Prior to Visit  Medication Sig Dispense Refill   albuterol (VENTOLIN HFA) 108 (90 Base) MCG/ACT inhaler  Inhale 2 puffs into the lungs daily as needed for shortness of breath.     amLODipine (NORVASC) 10 MG tablet Take 1 tablet (10 mg total) by mouth daily. 90 tablet 3   aspirin EC 81 MG tablet Take 81 mg by mouth daily.     atorvastatin (LIPITOR) 40 MG tablet TAKE 1 TABLET (40 MG TOTAL) BY MOUTH DAILY. 30 tablet 1   dexlansoprazole (DEXILANT) 60 MG capsule Take 1 capsule (60 mg total) by mouth daily. 90 capsule 3   FLUoxetine (PROZAC) 40 MG capsule Take 40 mg by mouth daily.     hydrochlorothiazide (HYDRODIURIL) 25 MG tablet Take 25 mg by mouth daily.     hydroxychloroquine (PLAQUENIL) 200 MG tablet Take 200 mg by mouth daily.     levothyroxine (SYNTHROID) 137 MCG tablet Take 137 mcg by mouth daily before breakfast.      lisinopril (PRINIVIL,ZESTRIL) 40 MG tablet Take 40 mg by mouth daily.     metFORMIN (GLUCOPHAGE) 500 MG tablet  Take 1 tablet (500 mg total) by mouth daily with breakfast. (Patient taking differently: Take 500 mg by mouth 2 (two) times daily with a meal.) 30 tablet 1   pramipexole (MIRAPEX) 0.125 MG tablet Take 0.125 mg by mouth daily as needed (restles legs). Reported on 05/24/2016     RESTASIS 0.05 % ophthalmic emulsion Place 1 drop into both eyes daily as needed for dry eyes.     Semaglutide, 2 MG/DOSE, (OZEMPIC, 2 MG/DOSE,) 8 MG/3ML SOPN Inject 2 mg into the skin once a week. 3 mL 11   vitamin E 400 UNIT capsule Take 400 Units by mouth daily.     No current facility-administered medications on file prior to visit.    No Known Allergies  There were no vitals taken for this visit.   Assessment/Plan:  1. Hypertension - Blood pressure well controlled in clinic today, at goal of <130/80. Was at goal at PCP office a month ago as well. Does not check at home. Continue amlodipine 10mg  daily, lisinopril 40mg  daily, hydrochlorothiazide 25mg  daily.  2. DM and Weight loss-  Patient has lost ~ 46lb (13%) of body weight. A1C down to 5.4. Continue Ozempic 2mg  weekly and metformin 500mg  daily. Follow up as needed.  Thank you  Olene Floss, Pharm.D, BCPS, CPP [facility] [address] [place], [place] [address]  Phone: [phone]; Fax: [phone]

## 2022-07-13 NOTE — Patient Instructions (Addendum)
Continue taking Ozempic '2mg'$  weekly Continue amlodipine '10mg'$  daily, lisinopril '40mg'$  daily, hydrochlorothiazide '25mg'$  daily Please call me at [phone] with any questions Resume exercising both cardio and weight lifting.

## 2022-07-16 NOTE — Progress Notes (Unsigned)
Cardiology Office Note:    Date:  07/16/2022   ID:  [name], DOB 18-Jul-1961, MRN [id number]  PCP:  Heather Roberts, NP   [facility] Providers Cardiologist:  Alverda Skeans, MD Referring MD: Heather Roberts, NP   Chief Complaint/Reason for Referral: Dyspnea  ASSESSMENT:    Coronary artery calcification seen on CAT scan  Type 2 diabetes mellitus without complication, without long-term current use of insulin (HCC)  Hypertension associated with diabetes (HCC)  Hyperlipidemia associated with type 2 diabetes mellitus (HCC)  BMI 40.0-44.9, adult (HCC)  Aortic atherosclerosis (HCC)    PLAN:    In order of problems listed above:  1.  Coronary artery calcification: FFR CTA was relatively reassuring with only the distal LAD being positive.  Continue medical therapy with aspirin, atorvastatin, and strict blood pressure control. 2.  Type 2 diabetes: Continue aspirin, lisinopril, and atorvastatin.  We will start Jardiance 10 mg daily. 3.  Hypertension: 4.  Hyperlipidemia: Check lipid panel, LFTs, and LP(a) today. 5.  Elevated BMI: The patient is on Ozempic and has lost a good amount of weight. 6.  Aortic atherosclerosis: Continue aspirin, statin, and strict blood pressure control.   Dispo:  No follow-ups on file.     Medication Adjustments/Labs and Tests Ordered: Current medicines are reviewed at length with the patient today.  Concerns regarding medicines are outlined above.   Tests Ordered: No orders of the defined types were placed in this encounter.   Medication Changes: No orders of the defined types were placed in this encounter.   History of Present Illness:    FOCUSED CARDIOVASCULAR PROBLEM LIST:   1.  Type 2 diabetes 2.  Hyperlipidemia 3.  Hypertension 4.  Lupus 5.  Elevated BMI 6.  Obstructive sleep apnea on CPAP 7.  Lupus 8.  Hypothyroidism 9.  Coronary CTA 2023 with mild to moderate disease; distal LAD is FFR positive 10.  Coronary artery  calcification chest CT 2023 11.  Aortic atherosclerosis chest abdomen pelvis CT 2023   The patient is a 61 y.o. female with the indicated medical history here for follow-up.    Initial visit February 2023: The patient was seen for initial consultation regarding dyspnea.  Given the evidence of coronary artery calcification on CT scan echocardiogram and coronary CTA was pursued.  She was also referred to pharmacy for recommendations regarding elevated BMI.  Amlodipine 10 mg was also started her lisinopril at maximum.  Today: In the interim the patient has been seen multiple times by pharmacy.  She was started on Ozempic and was noted to have lost close to 50 pounds.         Current Medications: No outpatient medications have been marked as taking for the 07/18/22 encounter (Appointment) with Orbie Pyo, MD.     Allergies:    Patient has no known allergies.   Social History:   Social History   Tobacco Use   Smoking status: Never   Smokeless tobacco: Never   Tobacco comments:    Never smoked  Vaping Use   Vaping Use: Never used  Substance Use Topics   Alcohol use: No    Alcohol/week: 0.0 standard drinks of alcohol   Drug use: No     Family Hx: Family History  Adopted: Yes  Problem Relation Age of Onset   Colon cancer Father        patient was adopted, but she knows birth father. Believes he had colon cancer   Cancer Neg Hx  Review of Systems:   Please see the history of present illness.    All other systems reviewed and are negative.     EKGs/Labs/Other Test Reviewed:    EKG:    Prior CV studies: None available  Imaging studies that I have independently reviewed today:   TTE 2023 Small pericardial effusion without tamponade, ejection fraction of 55 to 60% with normal diastolic function no significant valvular abnormalities  Coronary CTA 2023 1. Coronary calcium score of 1066. This was 3 percentile for age and sex matched control. 2. Normal  coronary origin with right dominance. 3. CAD-RADS 3. Moderate stenosis. Consider symptom-guided anti-ischemic pharmacotherapy as well as risk factor modification per guideline directed care. Additional analysis with CT FFR will be submitted.  FFR CTA 2023 1. Left Main: FFR = 0.96 2. LAD: Proximal FFR = 0.96, mid FFR = 0.84, distal FFR = 0.66 - distal tip 3. LCX: Proximal FFR = 0.92, distal FFR = 0.92 4. RCA: Proximal FFR = 0.97, mid FFR =0.94, distal FFR = 0.92  CT 1/23 1. Negative examination for pulmonary embolism. 2. Trace right pleural effusion with bibasilar interlobular septal thickening, consistent with pulmonary edema. 3. Cardiomegaly with three-vessel coronary artery calcifications.  Recent Labs: 11/28/2021: ALT 27; Hemoglobin 13.2; Platelets 176 12/13/2021: BUN 13; Creatinine, Ser 0.70; Potassium 3.7; Sodium 142; TSH 7.640   Recent Lipid Panel No results found for: "CHOL", "TRIG", "HDL", "LDLCALC", "LDLDIRECT"  Risk Assessment/Calculations:          Physical Exam:    VS:  There were no vitals taken for this visit.   Wt Readings from Last 3 Encounters:  07/13/22 298 lb (135.2 kg)  06/02/22 (!) 301 lb 9.6 oz (136.8 kg)  05/31/22 (!) 302 lb (137 kg)    GENERAL:  No apparent distress, AOx3 HEENT:  No carotid bruits, +2 carotid impulses, no scleral icterus CAR: RRR no murmurs, gallops, rubs, or thrills RES:  Clear to auscultation bilaterally ABD:  Soft, nontender, nondistended, positive bowel sounds x 4 VASC:  +2 radial pulses, +2 carotid pulses, palpable pedal pulses NEURO:  CN 2-12 grossly intact; motor and sensory grossly intact PSYCH:  No active depression or anxiety EXT:  No edema, ecchymosis, or cyanosis  Signed, Orbie Pyo, MD  07/16/2022 2:15 PM    [facility] [address] [place], [place], [place]  [address] Phone: [phone]; Fax: [phone]   Note:  This document was prepared using Dragon voice recognition software and may  include unintentional dictation errors.

## 2022-07-18 ENCOUNTER — Encounter: Payer: Self-pay | Admitting: Internal Medicine

## 2022-07-18 ENCOUNTER — Ambulatory Visit: Payer: Medicare Other | Attending: Internal Medicine | Admitting: Internal Medicine

## 2022-07-18 VITALS — BP 128/72 | HR 77 | Ht 65.0 in | Wt 294.6 lb

## 2022-07-18 DIAGNOSIS — E785 Hyperlipidemia, unspecified: Secondary | ICD-10-CM

## 2022-07-18 DIAGNOSIS — E1159 Type 2 diabetes mellitus with other circulatory complications: Secondary | ICD-10-CM

## 2022-07-18 DIAGNOSIS — E1169 Type 2 diabetes mellitus with other specified complication: Secondary | ICD-10-CM | POA: Diagnosis not present

## 2022-07-18 DIAGNOSIS — I152 Hypertension secondary to endocrine disorders: Secondary | ICD-10-CM

## 2022-07-18 DIAGNOSIS — I2510 Atherosclerotic heart disease of native coronary artery without angina pectoris: Secondary | ICD-10-CM | POA: Diagnosis not present

## 2022-07-18 DIAGNOSIS — E119 Type 2 diabetes mellitus without complications: Secondary | ICD-10-CM | POA: Diagnosis not present

## 2022-07-18 DIAGNOSIS — I700 Atherosclerosis of aorta: Secondary | ICD-10-CM

## 2022-07-18 DIAGNOSIS — Z6841 Body Mass Index (BMI) 40.0 and over, adult: Secondary | ICD-10-CM

## 2022-07-18 MED ORDER — EMPAGLIFLOZIN 10 MG PO TABS: 10.0000 mg | ORAL_TABLET | Freq: Every day | ORAL | 3 refills | Status: DC

## 2022-07-18 NOTE — Patient Instructions (Signed)
Medication Instructions:  Your physician has recommended you make the following change in your medication: Start Jardiance 10 mg by mouth daily   *If you need a refill on your cardiac medications before your next appointment, please call your pharmacy*   Lab Work: Lab work to be done today  Lipoprotein A If you have labs (blood work) drawn today and your tests are completely normal, you will receive your results only by: MyChart Message (if you have MyChart) OR A paper copy in the mail If you have any lab test that is abnormal or we need to change your treatment, we will call you to review the results.   Testing/Procedures: none   Follow-Up: At [facility], you and your health needs are our priority.  As part of our continuing mission to provide you with exceptional heart care, we have created designated Provider Care Teams.  These Care Teams include your primary Cardiologist (physician) and Advanced Practice Providers (APPs -  Physician Assistants and Nurse Practitioners) who all work together to provide you with the care you need, when you need it.  We recommend signing up for the patient portal called "MyChart".  Sign up information is provided on this After Visit Summary.  MyChart is used to connect with patients for Virtual Visits (Telemedicine).  Patients are able to view lab/test results, encounter notes, upcoming appointments, etc.  Non-urgent messages can be sent to your provider as well.   To learn more about what you can do with MyChart, go to [email or web address].    Your next appointment:   12 month(s)  The format for your next appointment:   In Person  Provider:   Dr Ali Lowe    Other Instructions    Important Information About Sugar

## 2022-07-20 LAB — LIPOPROTEIN A (LPA): Lipoprotein (a): 37.8 nmol/L (ref <75.0)

## 2022-08-05 IMAGING — CT CT ABD-PELV W/ CM
2 of 5 series · 17 of 46 positions shown, 19 images · IV contrast (APPLIED)
Comparison: 01/11/2016 and 12/01/2014

CLINICAL DATA: Mid abdominal pain since last week. Diarrhea and
nausea.

EXAM:
CT ABDOMEN AND PELVIS WITH CONTRAST
TECHNIQUE: Multidetector CT imaging of the abdomen and pelvis was performed
using the standard protocol following bolus administration of
intravenous contrast.
CONTRAST:  100mL OMNIPAQUE IOHEXOL 300 MG/ML  SOLN

[Series 3: abdomen 5.0 · axial · 0.90mm/px · z∈[+784,+1199]mm · 14 of 97 slices shown, 16 images]
[im 7/97  soft-tissue]
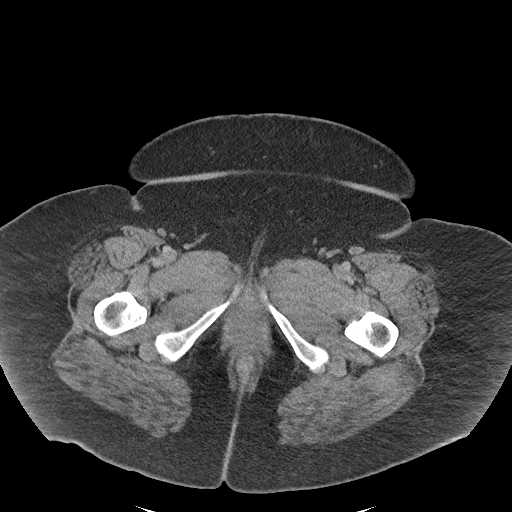
[im 7/97  bone]
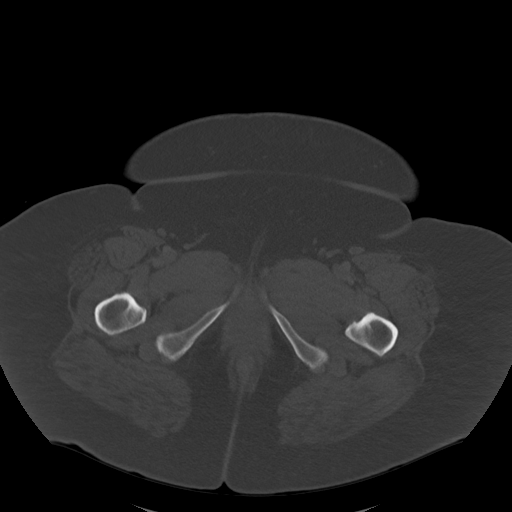
[im 13/97  soft-tissue]
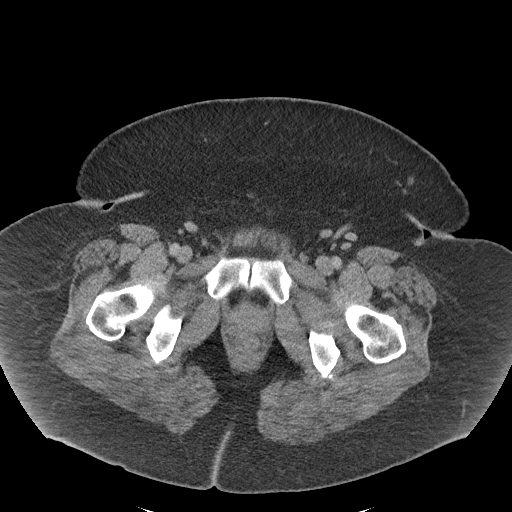
[im 20/97  soft-tissue]
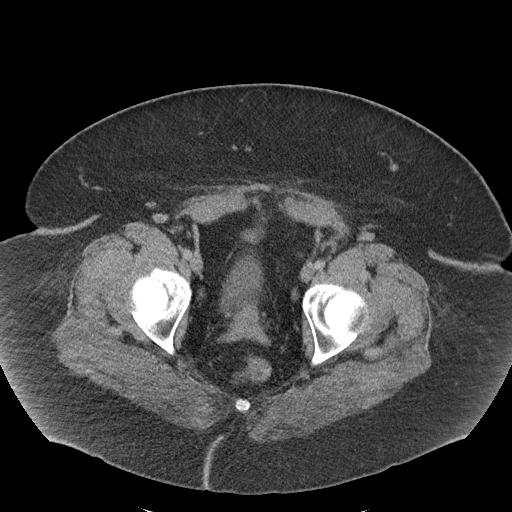
[im 26/97  soft-tissue]
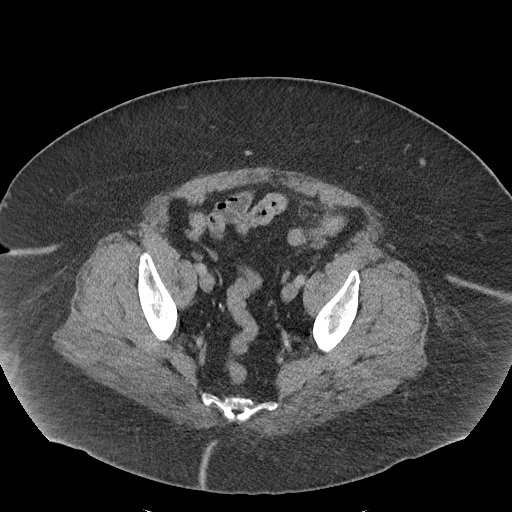
[im 33/97  soft-tissue]
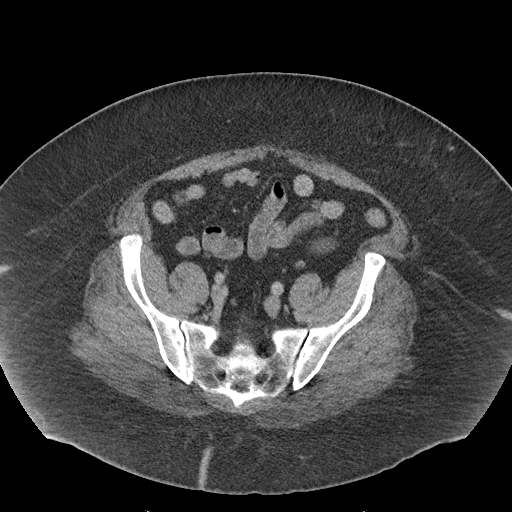
[im 39/97  soft-tissue]
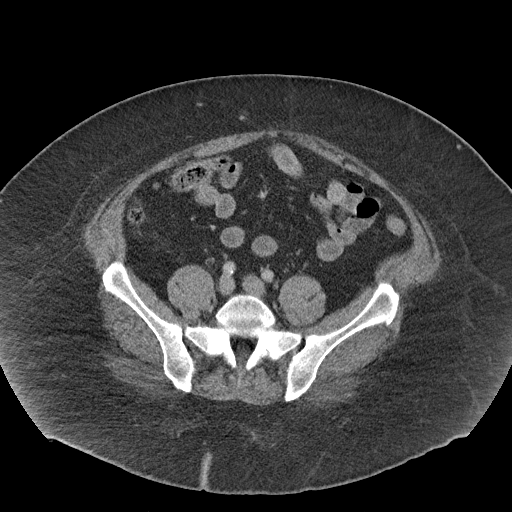
[im 45/97  soft-tissue]
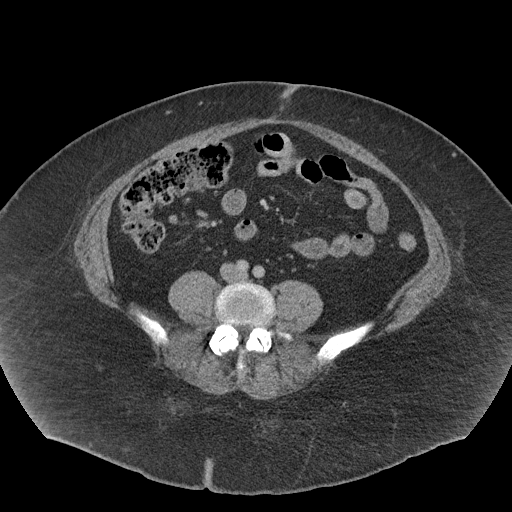
[im 52/97  soft-tissue]
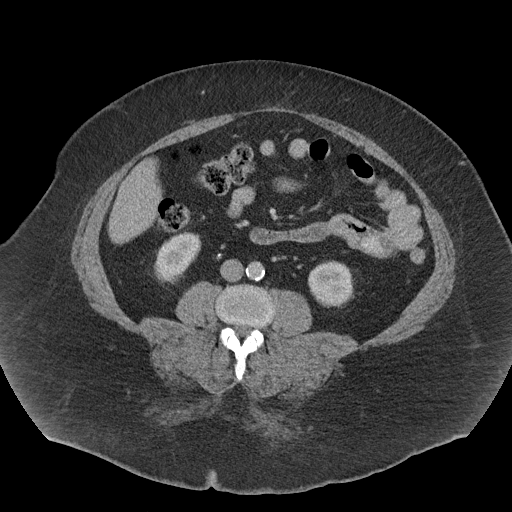
[im 58/97  soft-tissue]
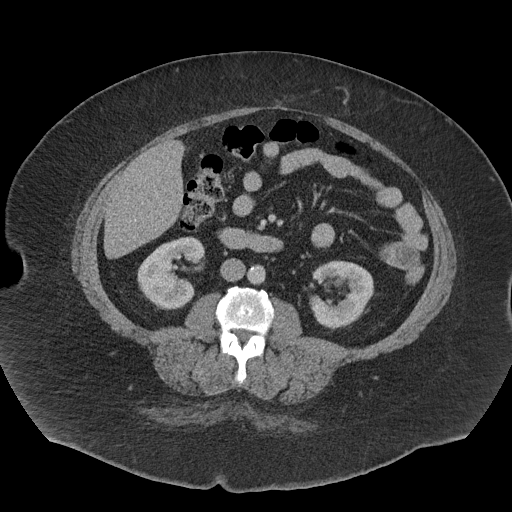
[im 58/97  bone]
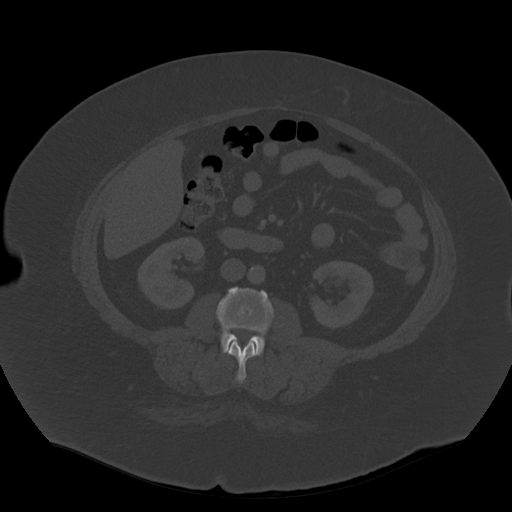
[im 65/97  soft-tissue]
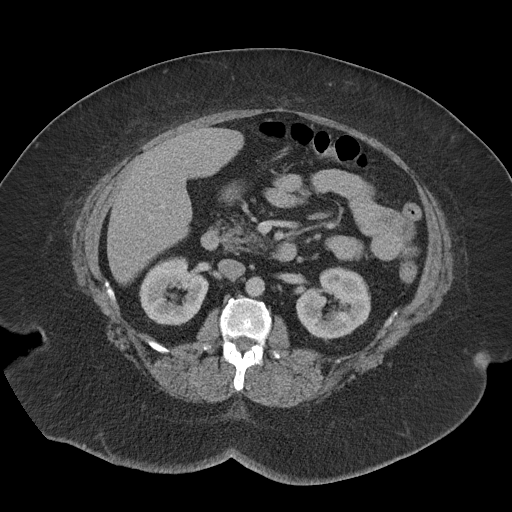
[im 71/97  soft-tissue]
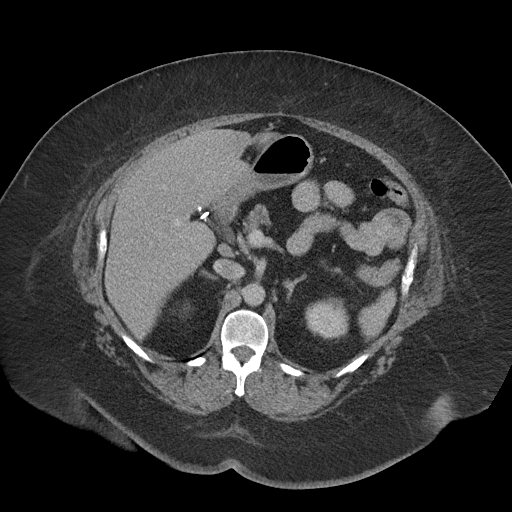
[im 77/97  soft-tissue]
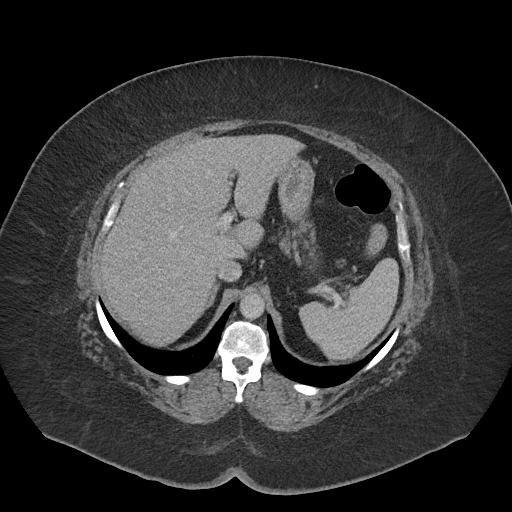
[im 84/97  soft-tissue]
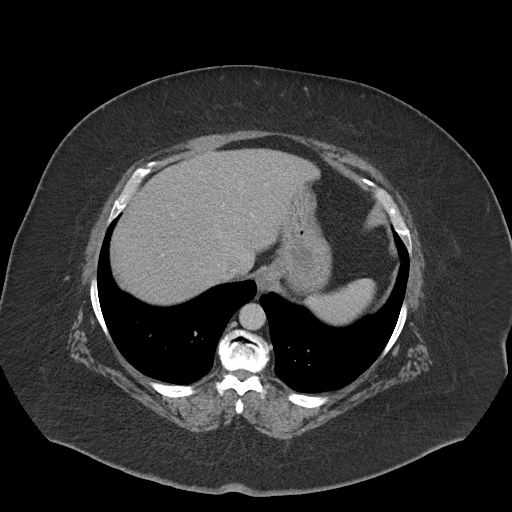
[im 90/97  soft-tissue]
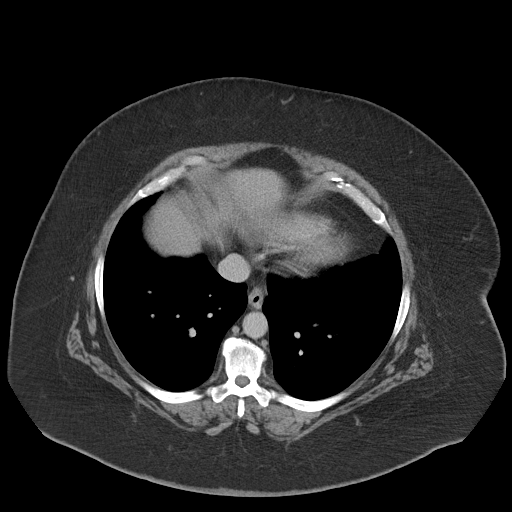

[Series 5: abdomen 3.0 mpr cor · coronal · 0.94mm/px · 3 of 136 slices shown]
[im 46/136  soft-tissue]
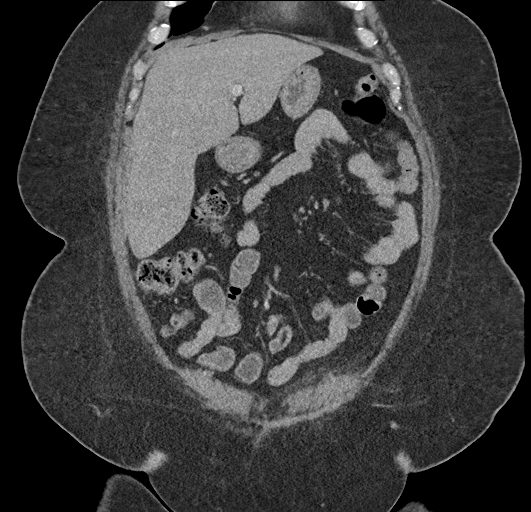
[im 61/136  soft-tissue]
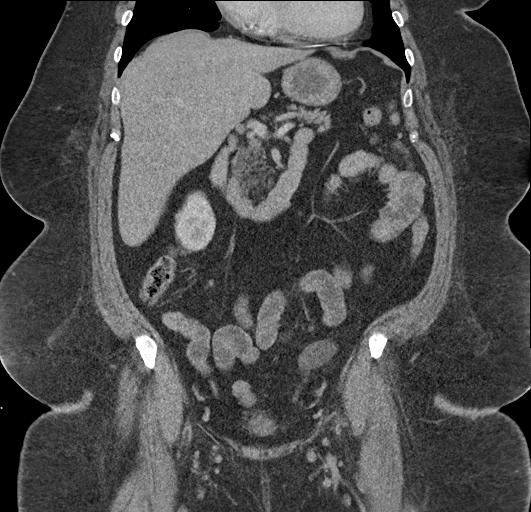
[im 76/136  soft-tissue]
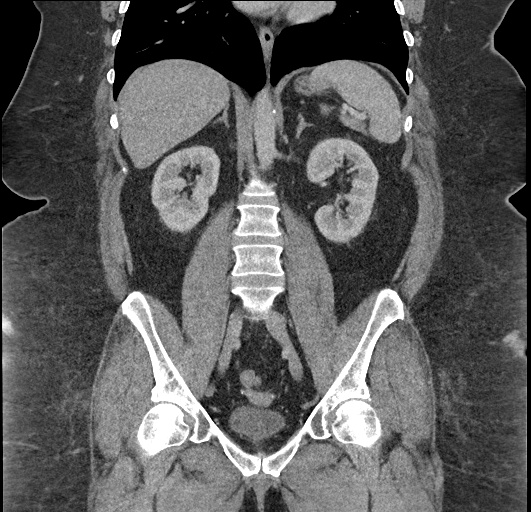

[17 of 46 positions shown; findings below may reference images not displayed]

FINDINGS: Lower chest: The lung bases are clear.

Hepatobiliary: No focal liver abnormality is seen. Status post
cholecystectomy. No biliary dilatation. Mild diffuse fatty
infiltration of the liver.

Pancreas: Unremarkable. No pancreatic ductal dilatation or
surrounding inflammatory changes.

Spleen: Normal in size without focal abnormality.

Adrenals/Urinary Tract: Adrenal glands are unremarkable. Kidneys are
normal, without renal calculi, focal lesion, or hydronephrosis.
Bladder is unremarkable.

Stomach/Bowel: Stomach is within normal limits. Appendix appears
normal. No evidence of bowel wall thickening, distention, or
inflammatory changes.

Vascular/Lymphatic: Aortic atherosclerosis. No enlarged abdominal or
pelvic lymph nodes.

Reproductive: Partial hysterectomy.  No abnormal pelvic masses.

Other: No abdominal wall hernia or abnormality. No abdominopelvic
ascites.

Musculoskeletal: No acute or significant osseous findings.
IMPRESSION: No acute process demonstrated in the abdomen or pelvis. No evidence
of bowel obstruction or inflammation. Mild diffuse fatty
infiltration of the liver.

Aortic Atherosclerosis ([id number]).

## 2022-08-22 ENCOUNTER — Encounter (HOSPITAL_COMMUNITY): Payer: Self-pay

## 2022-08-22 ENCOUNTER — Other Ambulatory Visit: Payer: Self-pay

## 2022-08-22 ENCOUNTER — Emergency Department (HOSPITAL_COMMUNITY): Payer: Medicare Other

## 2022-08-22 ENCOUNTER — Emergency Department (HOSPITAL_COMMUNITY)
Admission: EM | Admit: 2022-08-22 | Discharge: 2022-08-22 | Disposition: A | Discharge status: Home / Self Care | Payer: Medicare Other | Attending: Emergency Medicine | Admitting: Emergency Medicine

## 2022-08-22 DIAGNOSIS — Z7982 Long term (current) use of aspirin: Secondary | ICD-10-CM | POA: Insufficient documentation

## 2022-08-22 DIAGNOSIS — N200 Calculus of kidney: Secondary | ICD-10-CM | POA: Insufficient documentation

## 2022-08-22 DIAGNOSIS — R1030 Lower abdominal pain, unspecified: Secondary | ICD-10-CM | POA: Diagnosis present

## 2022-08-22 LAB — CBC WITH DIFFERENTIAL/PLATELET
Abs Immature Granulocytes: 0.02 10*3/uL (ref 0.00–0.07)
Basophils Absolute: 0 10*3/uL (ref 0.0–0.1)
Basophils Relative: 0 %
Eosinophils Absolute: 0.2 10*3/uL (ref 0.0–0.5)
Eosinophils Relative: 2 %
HCT: 47.5 % — ABNORMAL HIGH (ref 36.0–46.0)
Hemoglobin: 15.6 g/dL — ABNORMAL HIGH (ref 12.0–15.0)
Immature Granulocytes: 0 %
Lymphocytes Relative: 27 %
Lymphs Abs: 3 10*3/uL (ref 0.7–4.0)
MCH: 29.2 pg (ref 26.0–34.0)
MCHC: 32.8 g/dL (ref 30.0–36.0)
MCV: 88.8 fL (ref 80.0–100.0)
Monocytes Absolute: 0.9 10*3/uL (ref 0.1–1.0)
Monocytes Relative: 8 %
Neutro Abs: 6.9 10*3/uL (ref 1.7–7.7)
Neutrophils Relative %: 63 %
Platelets: 238 10*3/uL (ref 150–400)
RBC: 5.35 MIL/uL — ABNORMAL HIGH (ref 3.87–5.11)
RDW: 12.6 % (ref 11.5–15.5)
WBC: 11 10*3/uL — ABNORMAL HIGH (ref 4.0–10.5)
nRBC: 0 % (ref 0.0–0.2)

## 2022-08-22 LAB — URINALYSIS, ROUTINE W REFLEX MICROSCOPIC
Bacteria, UA: NONE SEEN
Bilirubin Urine: NEGATIVE
Glucose, UA: >=500 mg/dL — AB
Hgb urine dipstick: NEGATIVE
Ketones, ur: NEGATIVE mg/dL
Nitrite: NEGATIVE
Protein, ur: 30 mg/dL — AB
Specific Gravity, Urine: 1.032 — ABNORMAL HIGH (ref 1.005–1.030)
pH: 5 (ref 5.0–8.0)

## 2022-08-22 LAB — LIPASE, BLOOD: Lipase: 33 U/L (ref 11–51)

## 2022-08-22 LAB — COMPREHENSIVE METABOLIC PANEL
ALT: 14 U/L (ref 0–44)
AST: 15 U/L (ref 15–41)
Albumin: 4.3 g/dL (ref 3.5–5.0)
Alkaline Phosphatase: 74 U/L (ref 38–126)
Anion gap: 11 (ref 5–15)
BUN: 18 mg/dL (ref 6–20)
CO2: 23 mmol/L (ref 22–32)
Calcium: 9.4 mg/dL (ref 8.9–10.3)
Chloride: 105 mmol/L (ref 98–111)
Creatinine, Ser: 0.96 mg/dL (ref 0.44–1.00)
GFR, Estimated: >60 mL/min (ref >60)
Glucose, Bld: 98 mg/dL (ref 70–99)
Potassium: 3.2 mmol/L — ABNORMAL LOW (ref 3.5–5.1)
Sodium: 139 mmol/L (ref 135–145)
Total Bilirubin: 0.9 mg/dL (ref 0.3–1.2)
Total Protein: 7.5 g/dL (ref 6.5–8.1)

## 2022-08-22 MED ORDER — MORPHINE SULFATE 15 MG PO TABS: 7.5000 mg | ORAL_TABLET | ORAL | 0 refills | Status: DC | PRN

## 2022-08-22 MED ORDER — AMOXICILLIN-POT CLAVULANATE 875-125 MG PO TABS: 1.0000 | ORAL_TABLET | Freq: Two times a day (BID) | ORAL | 0 refills | Status: DC

## 2022-08-22 MED ORDER — ONDANSETRON 4 MG PO TBDP: ORAL_TABLET | ORAL | 0 refills | Status: DC

## 2022-08-22 NOTE — ED Triage Notes (Addendum)
Pt c/o worsening abdominal pain x4 days since starting an antibiotic for a UTI. Pt c/o blood in the toilet today after using the bathroom (three times today), pt not certain if it was from her rectum or urethra. Pt has hx of hysterectomy. Pt denies fever/chills.

## 2022-08-22 NOTE — ED Notes (Signed)
ED Provider at bedside. 

## 2022-08-22 NOTE — ED Provider Notes (Signed)
[facility] DEPT Provider Note   CSN: [id number] Arrival date & time: 08/22/22  1355     History  Chief Complaint  Patient presents with   Abdominal Pain    Jamari AINSLEY DEAKINS is a 61 y.o. female.  61 yo F with a chief complaints of lower abdominal discomfort and blood in her stool.  This has been going on for a few days now.  She was seen in an urgent care setting and was diagnosed with a urinary tract infection.  She was initially started on ciprofloxacin and then was transitioned to Keflex.  No fevers no flank pain.   Abdominal Pain      Home Medications Prior to Admission medications   Medication Sig Start Date End Date Taking? Authorizing Provider  amoxicillin-clavulanate (AUGMENTIN) 875-125 MG tablet Take 1 tablet by mouth every 12 (twelve) hours. 08/22/22  Yes Deno Etienne, DO  morphine (MSIR) 15 MG tablet Take 0.5 tablets (7.5 mg total) by mouth every 4 (four) hours as needed for severe pain. 08/22/22  Yes Deno Etienne, DO  ondansetron (ZOFRAN-ODT) 4 MG disintegrating tablet '4mg'$  ODT q4 hours prn nausea/vomit 08/22/22  Yes Deno Etienne, DO  albuterol (VENTOLIN HFA) 108 (90 Base) MCG/ACT inhaler Inhale 2 puffs into the lungs daily as needed for shortness of breath. 11/25/21   [provider]  amLODipine (NORVASC) 10 MG tablet Take 1 tablet (10 mg total) by mouth daily. 12/13/21   Early Osmond, MD  aspirin EC 81 MG tablet Take 81 mg by mouth daily.    [provider]  atorvastatin (LIPITOR) 40 MG tablet TAKE 1 TABLET (40 MG TOTAL) BY MOUTH DAILY. 03/19/14   Lysbeth Penner, FNP  dexlansoprazole (DEXILANT) 60 MG capsule Take 1 capsule (60 mg total) by mouth daily. 01/05/21   Annitta Needs, NP  empagliflozin (JARDIANCE) 10 MG TABS tablet Take 1 tablet (10 mg total) by mouth daily before breakfast. 07/18/22   Early Osmond, MD  FLUoxetine (PROZAC) 40 MG capsule Take 40 mg by mouth daily.    [provider]  hydrochlorothiazide  (HYDRODIURIL) 25 MG tablet Take 25 mg by mouth daily.    [provider]  hydroxychloroquine (PLAQUENIL) 200 MG tablet Take 200 mg by mouth daily.    [provider]  levothyroxine (SYNTHROID) 137 MCG tablet Take 137 mcg by mouth daily before breakfast.     [provider]  lisinopril (PRINIVIL,ZESTRIL) 40 MG tablet Take 40 mg by mouth daily.    [provider]  metFORMIN (GLUCOPHAGE) 500 MG tablet Take 1 tablet (500 mg total) by mouth daily with breakfast. Patient taking differently: Take 500 mg by mouth daily after supper. 03/07/13   Chipper Herb, MD  pramipexole (MIRAPEX) 0.125 MG tablet Take 0.125 mg by mouth daily as needed (restles legs). Reported on 05/24/2016    [provider]  RESTASIS 0.05 % ophthalmic emulsion Place 1 drop into both eyes daily as needed for dry eyes. 08/04/21   [provider]  Semaglutide, 2 MG/DOSE, (OZEMPIC, 2 MG/DOSE,) 8 MG/3ML SOPN Inject 2 mg into the skin once a week. 05/11/22   Early Osmond, MD  vitamin E 400 UNIT capsule Take 400 Units by mouth daily.    [provider]      Allergies    Patient has no known allergies.    Review of Systems   Review of Systems  Gastrointestinal:  Positive for abdominal pain.    Physical Exam  Updated Vital Signs BP (!) 174/79   Pulse 64   Temp 98.6 F (37 C) (Oral)   Resp 17   SpO2 96%  Physical Exam Vitals and nursing note reviewed.  Constitutional:      General: She is not in acute distress.    Appearance: She is well-developed. She is not diaphoretic.  HENT:     Head: Normocephalic and atraumatic.  Eyes:     Pupils: Pupils are equal, round, and reactive to light.  Cardiovascular:     Rate and Rhythm: Normal rate and regular rhythm.     Heart sounds: No murmur heard.    No friction rub. No gallop.  Pulmonary:     Effort: Pulmonary effort is normal.     Breath sounds: No wheezing or rales.  Abdominal:     General: There is no distension.      Palpations: Abdomen is soft.     Tenderness: There is no abdominal tenderness.  Musculoskeletal:        General: No tenderness.     Cervical back: Normal range of motion and neck supple.  Skin:    General: Skin is warm and dry.  Neurological:     Mental Status: She is alert and oriented to person, place, and time.  Psychiatric:        Behavior: Behavior normal.     ED Results / Procedures / Treatments   Labs (all labs ordered are listed, but only abnormal results are displayed) Labs Reviewed  CBC WITH DIFFERENTIAL/PLATELET - Abnormal; Notable for the following components:      Result Value   WBC 11.0 (*)    RBC 5.35 (*)    Hemoglobin 15.6 (*)    HCT 47.5 (*)    All other components within normal limits  COMPREHENSIVE METABOLIC PANEL - Abnormal; Notable for the following components:   Potassium 3.2 (*)    All other components within normal limits  URINALYSIS, ROUTINE W REFLEX MICROSCOPIC - Abnormal; Notable for the following components:   APPearance HAZY (*)    Specific Gravity, Urine 1.032 (*)    Glucose, UA >=500 (*)    Protein, ur 30 (*)    Leukocytes,Ua MODERATE (*)    All other components within normal limits  URINE CULTURE  LIPASE, BLOOD    EKG None  Radiology CT Renal Stone Study  Result Date: 08/22/2022 CLINICAL DATA:  Flank pain. EXAM: CT ABDOMEN AND PELVIS WITHOUT CONTRAST TECHNIQUE: Multidetector CT imaging of the abdomen and pelvis was performed following the standard protocol without IV contrast. RADIATION DOSE REDUCTION: This exam was performed according to the departmental dose-optimization program which includes automated exposure control, adjustment of the mA and/or kV according to patient size and/or use of iterative reconstruction technique. COMPARISON:  CT abdomen and pelvis 11/28/2021 FINDINGS: Lower chest: No acute abnormality. Hepatobiliary: No focal liver abnormality is seen. Status post cholecystectomy. No biliary dilatation. Pancreas:  Unremarkable. No pancreatic ductal dilatation or surrounding inflammatory changes. Spleen: Normal in size without focal abnormality. Adrenals/Urinary Tract: There is a 2 mm calculus in the proximal right ureter. There is no hydronephrosis. There are 2 additional punctate nonobstructing right renal calculi. The bladder, adrenal glands and left kidney are within normal limits. Stomach/Bowel: There is wall thickening and inflammatory stranding of the splenic flexure of the colon and descending colon. There is no pneumatosis, bowel obstruction or free air. The appendix, small bowel and stomach are within normal limits. Vascular/Lymphatic: Aortic atherosclerosis. No enlarged abdominal or pelvic lymph  nodes. Reproductive: Status post hysterectomy. No adnexal masses. Other: No abdominal wall hernia or abnormality. No abdominopelvic ascites. Musculoskeletal: No acute or significant osseous findings. IMPRESSION: 1. Wall thickening and inflammatory stranding of the splenic flexure of the colon and descending colon compatible with colitis. 2. 2 mm calculus in the proximal right ureter without hydronephrosis. 3. Additional nonobstructing right renal calculi. Aortic Atherosclerosis (ICD10-I70.0). Electronically Signed   By: Ronney Asters M.D.   On: 08/22/2022 17:17    Procedures Procedures    Medications Ordered in ED Medications - No data to display  ED Course/ Medical Decision Making/ A&P                           Medical Decision Making Amount and/or Complexity of Data Reviewed Labs: ordered.  Risk Prescription drug management.   61 yo F with a chief complaints of initially urinary symptoms and then lower abdominal discomfort which blood in her stool.  Is been going on for about half of a week.  She had a urine culture that came back positive on my record review with group B strep.  She has a mild leukocytosis here.  Afebrile.  Well-appearing.  CT with a right-sided UVJ stone.  2 mm.  Also with colitis.   We will add Augmentin to her antibiotic regimen.  I discussed the case with urology, Jiles Crocker, NP he thought reasonable the patient was well-appearing nontoxic to have her call the office in the morning to try and set up an appointment.  I encouraged her if she develop fevers or worsening pain to come back to the emergency department for evaluation.  11:07 PM:  I have discussed the diagnosis/risks/treatment options with the patient.  Evaluation and diagnostic testing in the emergency department does not suggest an emergent condition requiring admission or immediate intervention beyond what has been performed at this time.  They will follow up with PCP, urology. We also discussed returning to the ED immediately if new or worsening sx occur. We discussed the sx which are most concerning (e.g., sudden worsening pain, fever, inability to tolerate by mouth) that necessitate immediate return. Medications administered to the patient during their visit and any new prescriptions provided to the patient are listed below.  Medications given during this visit Medications - No data to display   The patient appears reasonably screen and/or stabilized for discharge and I doubt any other medical condition or other [facility] requiring further screening, evaluation, or treatment in the ED at this time prior to discharge.          Final Clinical Impression(s) / ED Diagnoses Final diagnoses:  Nephrolithiasis    Rx / DC Orders ED Discharge Orders          Ordered    amoxicillin-clavulanate (AUGMENTIN) 875-125 MG tablet  Every 12 hours        08/22/22 2110    morphine (MSIR) 15 MG tablet  Every 4 hours PRN        08/22/22 2110    ondansetron (ZOFRAN-ODT) 4 MG disintegrating tablet        08/22/22 2110              Deno Etienne, DO 08/22/22 2307

## 2022-08-22 NOTE — Discharge Instructions (Signed)
I spoke with the urologist and he wanted you to call the office in the morning and discuss your visit here and then they can see when they can see you in the office.  Please return for fever or uncontrolled pain.  Take 4 over the counter ibuprofen tablets 3 times a day or 2 over-the-counter naproxen tablets twice a day for pain. Also take tylenol '1000mg'$ (2 extra strength) four times a day.   Then take the pain medicine if you feel like you need it. Narcotics do not help with the pain, they only make you care about it less.  You can become addicted to this, people may break into your house to steal it.  It will constipate you.  If you drive under the influence of this medicine you can get a DUI.

## 2022-08-22 NOTE — ED Provider Triage Note (Signed)
Emergency Medicine Provider Triage Evaluation Note  [name] , a 61 y.o. female  was evaluated in triage.  Pt complains of right flank pain with nausea, chills. Onset with dysuria or frequency onset last Wednesday, went to UC who diagnosed UTI started abx, called pt on Friday and sent a different abx in after culture report. Now with right flank pain, chills, nausea, hematuria. Back pain is worse with lying on right side, changes in position.  Review of Systems  Positive: As above Negative: fever  Physical Exam  BP (!) 155/76   Pulse 73   Temp 98.6 F (37 C) (Oral)   Resp 18   SpO2 97%  Gen:   Awake, no distress   Resp:  Normal effort  MSK:   Moves extremities without difficulty  Other:  No CVA tenderness  Medical Decision Making  Medically screening exam initiated at 3:21 PM.  Appropriate orders placed.  [name] was informed that the remainder of the evaluation will be completed by another provider, this initial triage assessment does not replace that evaluation, and the importance of remaining in the ED until their evaluation is complete.     Tacy Learn, PA-C 08/22/22 [phone]

## 2022-08-24 LAB — URINE CULTURE: Culture: 10000 — AB

## 2022-08-25 ENCOUNTER — Telehealth (HOSPITAL_BASED_OUTPATIENT_CLINIC_OR_DEPARTMENT_OTHER): Payer: Self-pay | Admitting: *Deleted

## 2022-08-25 NOTE — Telephone Encounter (Signed)
Post ED Visit - Positive Culture Follow-up  Culture report reviewed by antimicrobial stewardship pharmacist: [place] Team '[]'$  [address] Pharm.D. '[]'$  Heide Guile, Pharm.D., BCPS AQ-ID '[]'$  Parks Neptune, Pharm.D., BCPS '[]'$  Alycia Rossetti, Pharm.D., BCPS '[]'$  Ten Sleep, Pharm.D., BCPS, AAHIVP '[]'$  Legrand Como, Pharm.D., BCPS, AAHIVP '[]'$  Salome Arnt, PharmD, BCPS '[]'$  Johnnette Gourd, PharmD, BCPS '[]'$  Hughes Better, PharmD, BCPS '[]'$  Leeroy Cha, PharmD '[]'$  Laqueta Linden, PharmD, BCPS '[]'$  Albertina Parr, PharmD  [place] [facility] '[]'$  Leodis Sias, PharmD '[]'$  Lindell Spar, PharmD '[]'$  Royetta Asal, PharmD '[]'$  Graylin Shiver, Rph '[]'$  Rema Fendt) Glennon Mac, PharmD '[]'$  Arlyn Dunning, PharmD '[]'$  Netta Cedars, PharmD '[]'$  Dia Sitter, PharmD '[]'$  Leone Haven, PharmD '[]'$  Gretta Arab, PharmD '[]'$  Theodis Shove, PharmD '[]'$  Peggyann Juba, PharmD '[]'$  Reuel Boom, PharmD   Positive urine culture Treated with Amoxicillin, organism sensitive to the same and no further patient follow-up is required at this time.  Jimmy Footman, PharmD  Harlon Flor Talley 08/25/2022, 10:17 AM

## 2022-09-02 ENCOUNTER — Telehealth: Payer: Self-pay | Admitting: Pharmacist

## 2022-09-02 MED ORDER — SEMAGLUTIDE (1 MG/DOSE) 4 MG/3ML ~~LOC~~ SOPN: 1.0000 mg | PEN_INJECTOR | SUBCUTANEOUS | 11 refills | Status: DC

## 2022-09-02 NOTE — Telephone Encounter (Signed)
Pharmacy cannot get '2mg'$ . She has missed 2 doses. Advised that technically we should start over, but patient preferred to just lower to the '1mg'$ . Rx sent.

## 2022-09-03 ENCOUNTER — Encounter (HOSPITAL_COMMUNITY): Payer: Self-pay

## 2022-09-03 ENCOUNTER — Emergency Department (HOSPITAL_COMMUNITY)
Admission: EM | Admit: 2022-09-03 | Discharge: 2022-09-04 | Disposition: A | Discharge status: Home / Self Care | Payer: Medicare Other | Attending: Emergency Medicine | Admitting: Emergency Medicine

## 2022-09-03 DIAGNOSIS — I10 Essential (primary) hypertension: Secondary | ICD-10-CM | POA: Diagnosis not present

## 2022-09-03 DIAGNOSIS — Z79899 Other long term (current) drug therapy: Secondary | ICD-10-CM | POA: Insufficient documentation

## 2022-09-03 DIAGNOSIS — L509 Urticaria, unspecified: Secondary | ICD-10-CM | POA: Diagnosis present

## 2022-09-03 DIAGNOSIS — Z7984 Long term (current) use of oral hypoglycemic drugs: Secondary | ICD-10-CM | POA: Diagnosis not present

## 2022-09-03 DIAGNOSIS — E119 Type 2 diabetes mellitus without complications: Secondary | ICD-10-CM | POA: Diagnosis not present

## 2022-09-03 DIAGNOSIS — Z7982 Long term (current) use of aspirin: Secondary | ICD-10-CM | POA: Diagnosis not present

## 2022-09-03 NOTE — ED Triage Notes (Signed)
Patient arrived stating she was seen on the 10/11 and 10/16 for hives, told to take benadryl. Complaints of burning and itching. Was previously taking antibiotics for a kidney infection but finished last week. No known allergen.

## 2022-09-04 DIAGNOSIS — L509 Urticaria, unspecified: Secondary | ICD-10-CM | POA: Diagnosis not present

## 2022-09-04 MED ORDER — PREDNISONE 50 MG PO TABS: 50.0000 mg | ORAL_TABLET | Freq: Every day | ORAL | 0 refills | Status: AC

## 2022-09-04 MED ORDER — PREDNISONE 20 MG PO TABS
60.0000 mg | ORAL_TABLET | Freq: Once | ORAL | Status: AC
  Administered 2022-09-04: 60 mg via ORAL
  Filled 2022-09-04: qty 3

## 2022-09-04 NOTE — ED Provider Notes (Signed)
[facility] DEPT Provider Note   CSN: [id number] Arrival date & time: 09/03/22  2333    History  Chief Complaint  Patient presents with   Urticaria    [name] is a 61 y.o. female history of hypertension, obesity, diabetes without insulin use for evaluation of urticaria.  Started on Thursday of last week.  Diffuse in nature.  No sensation of throat closing, rash to palms or soles.  Rash is pruritic in nature.  She has never had a thing like this previously.  Was seen by urgent care day of symptom start they recommended Benadryl and Claritin which she has been taking.  Has not helped her symptoms.  Of note patient was seen approximately 2 weeks ago for kidney stone and a UTI.  She was started on antibiotics which she has completed.  States her urinary symptoms have resolved.  She has not followed up with urology since ED visit.  No fever, nausea, vomiting, sore throat, shortness of breath, abdominal pain, dysuria or hematuria.  No new lotions, perfumes or detergents.  Has been off abx for 1 week  HPI     Home Medications Prior to Admission medications   Medication Sig Start Date End Date Taking? Authorizing Provider  predniSONE (DELTASONE) 50 MG tablet Take 1 tablet (50 mg total) by mouth daily for 4 days. 09/04/22 09/08/22 Yes Yoltzin Ransom A, PA-C  albuterol (VENTOLIN HFA) 108 (90 Base) MCG/ACT inhaler Inhale 2 puffs into the lungs daily as needed for shortness of breath. 11/25/21   [provider]  amLODipine (NORVASC) 10 MG tablet Take 1 tablet (10 mg total) by mouth daily. 12/13/21   Early Osmond, MD  amoxicillin-clavulanate (AUGMENTIN) 875-125 MG tablet Take 1 tablet by mouth every 12 (twelve) hours. 08/22/22   Deno Etienne, DO  aspirin EC 81 MG tablet Take 81 mg by mouth daily.    [provider]  atorvastatin (LIPITOR) 40 MG tablet TAKE 1 TABLET (40 MG TOTAL) BY MOUTH DAILY. 03/19/14   Lysbeth Penner, FNP   dexlansoprazole (DEXILANT) 60 MG capsule Take 1 capsule (60 mg total) by mouth daily. 01/05/21   Annitta Needs, NP  empagliflozin (JARDIANCE) 10 MG TABS tablet Take 1 tablet (10 mg total) by mouth daily before breakfast. 07/18/22   Early Osmond, MD  FLUoxetine (PROZAC) 40 MG capsule Take 40 mg by mouth daily.    [provider]  hydrochlorothiazide (HYDRODIURIL) 25 MG tablet Take 25 mg by mouth daily.    [provider]  hydroxychloroquine (PLAQUENIL) 200 MG tablet Take 200 mg by mouth daily.    [provider]  levothyroxine (SYNTHROID) 137 MCG tablet Take 137 mcg by mouth daily before breakfast.     [provider]  lisinopril (PRINIVIL,ZESTRIL) 40 MG tablet Take 40 mg by mouth daily.    [provider]  metFORMIN (GLUCOPHAGE) 500 MG tablet Take 1 tablet (500 mg total) by mouth daily with breakfast. Patient taking differently: Take 500 mg by mouth daily after supper. 03/07/13   Chipper Herb, MD  morphine (MSIR) 15 MG tablet Take 0.5 tablets (7.5 mg total) by mouth every 4 (four) hours as needed for severe pain. 08/22/22   Deno Etienne, DO  ondansetron (ZOFRAN-ODT) 4 MG disintegrating tablet '4mg'$  ODT q4 hours prn nausea/vomit 08/22/22   Deno Etienne, DO  pramipexole (MIRAPEX) 0.125 MG tablet Take 0.125 mg by mouth daily as needed (restles legs). Reported on 05/24/2016    [provider]  RESTASIS 0.05 % ophthalmic emulsion Place 1 drop into both eyes daily as needed for dry eyes. 08/04/21   [provider]  Semaglutide, 1 MG/DOSE, 4 MG/3ML SOPN Inject 1 mg into the skin once a week. 09/02/22   Early Osmond, MD  vitamin E 400 UNIT capsule Take 400 Units by mouth daily.    [provider]      Allergies    Patient has no known allergies.    Review of Systems   Review of Systems  Constitutional: Negative.   HENT: Negative.    Respiratory: Negative.    Cardiovascular: Negative.   Gastrointestinal: Negative.    Genitourinary: Negative.   Musculoskeletal: Negative.   Skin:  Positive for rash.  Neurological: Negative.   All other systems reviewed and are negative.   Physical Exam Updated Vital Signs BP (!) 175/98 (BP Location: Left Arm)   Pulse 73   Temp 97.9 F (36.6 C) (Oral)   Resp 18   SpO2 98%  Physical Exam Vitals and nursing note reviewed.  Constitutional:      General: She is not in acute distress.    Appearance: She is well-developed. She is not ill-appearing, toxic-appearing or diaphoretic.  HENT:     Head: Normocephalic and atraumatic.     Jaw: There is normal jaw occlusion.     Nose: Nose normal.     Mouth/Throat:     Lips: Pink.     Mouth: Mucous membranes are moist.     Pharynx: Uvula midline.     Comments: Uvula midline.  No angioedema.  No intraoral lesions Eyes:     Pupils: Pupils are equal, round, and reactive to light.  Neck:     Trachea: Trachea and phonation normal.  Cardiovascular:     Rate and Rhythm: Normal rate.     Pulses: Normal pulses.     Heart sounds: Normal heart sounds.  Pulmonary:     Effort: Pulmonary effort is normal. No respiratory distress.     Breath sounds: Normal breath sounds and air entry.  Abdominal:     General: Bowel sounds are normal. There is no distension.     Palpations: Abdomen is soft.  Musculoskeletal:        General: No swelling, tenderness, deformity or signs of injury. Normal range of motion.     Cervical back: Normal range of motion.     Right lower leg: No edema.     Left lower leg: No edema.  Skin:    General: Skin is warm and dry.     Capillary Refill: Capillary refill takes less than 2 seconds.     Findings: Rash present.     Comments: Diffuse urticaria anterior posterior trunk as well as bilateral upper extremities.  No vesicles, bulla, target lesions.  No rash to palms or soles.  Neurological:     General: No focal deficit present.     Mental Status: She is alert and oriented to person, place, and time.      Cranial Nerves: Cranial nerves 2-12 are intact.     Sensory: Sensation is intact.     Motor: Motor function is intact.     Gait: Gait is intact.  Psychiatric:        Mood and Affect: Mood normal.    ED Results / Procedures / Treatments   Labs (all labs ordered are listed, but only abnormal results are displayed) Labs Reviewed - No data to display  EKG None  Radiology No results found.  Procedures Procedures    Medications Ordered in ED Medications  predniSONE (DELTASONE) tablet 60 mg (has no administration in time range)    ED Course/ Medical Decision Making/ A&P    61 year old here for evaluation of urticaria.  Began approximately 4 days ago, initially seen by urgent care told to take Benadryl and Claritin which she has been doing.  Symptoms not improving.  Note she was started approximately 2 weeks ago on Augmentin for UTI and kidney stone.  States her urinary sx improved.  Patient on exam does have diffuse urticaria however no rash to palms or soles.  No intraoral lesions.  No vesicles, bulla, target lesions.  No desquamated skin.  No evidence of anaphylaxis on exam.  She would likely benefit from short course of steroids.  Follow-up closely outpatient, return for new or worsening symptoms.  First dose given here in ED.  Rash consistent with urticaria. Patient denies any difficulty breathing or swallowing.  Pt has a patent airway without stridor and is handling secretions without difficulty; no angioedema. No blisters, no pustules, no warmth, no draining sinus tracts, no superficial abscesses, no bullous impetigo, no vesicles, no desquamation, no target lesions with dusky purpura or a central bulla. Not tender to touch. No concern for superimposed infection. No concern for SJS, TEN, TSS, tick borne illness, syphilis or other life-threatening condition.  Patient is hemodynamically stable and in no acute distress.  Patient able to ambulate in department prior to ED.  Evaluation  does not show acute pathology that would require ongoing or additional emergent interventions while in the emergency department or further inpatient treatment.  I have discussed the diagnosis with the patient and answered all questions.  Pain is been managed while in the emergency department and patient has no further complaints prior to discharge.  Patient is comfortable with plan discussed in room and is stable for discharge at this time.  I have discussed strict return precautions for returning to the emergency department.  Patient was encouraged to follow-up with PCP/specialist refer to at discharge.                           Medical Decision Making Amount and/or Complexity of Data Reviewed External Data Reviewed: labs, radiology and notes.  Risk OTC drugs. Prescription drug management. Diagnosis or treatment significantly limited by social determinants of health.         Final Clinical Impression(s) / ED Diagnoses Final diagnoses:  Urticaria    Rx / DC Orders ED Discharge Orders          Ordered    predniSONE (DELTASONE) 50 MG tablet  Daily        09/04/22 0158              Stafford Riviera A, PA-C 09/04/22 0159    Veryl Speak, MD 09/04/22 [phone]

## 2022-09-04 NOTE — Discharge Instructions (Addendum)
Take the medication as prescribed.  Return for new or worsening symptoms.

## 2022-11-06 ENCOUNTER — Other Ambulatory Visit: Payer: Self-pay | Admitting: Internal Medicine

## 2022-11-06 DIAGNOSIS — I10 Essential (primary) hypertension: Secondary | ICD-10-CM

## 2022-11-23 ENCOUNTER — Other Ambulatory Visit: Payer: Self-pay | Admitting: Internal Medicine

## 2023-01-01 ENCOUNTER — Other Ambulatory Visit: Payer: Self-pay | Admitting: Internal Medicine

## 2023-01-01 DIAGNOSIS — E1169 Type 2 diabetes mellitus with other specified complication: Secondary | ICD-10-CM

## 2023-04-30 ENCOUNTER — Other Ambulatory Visit: Payer: Self-pay | Admitting: Internal Medicine

## 2023-04-30 DIAGNOSIS — I10 Essential (primary) hypertension: Secondary | ICD-10-CM

## 2023-07-04 ENCOUNTER — Other Ambulatory Visit: Payer: Self-pay | Admitting: Internal Medicine

## 2023-07-04 DIAGNOSIS — E1169 Type 2 diabetes mellitus with other specified complication: Secondary | ICD-10-CM

## 2023-07-27 ENCOUNTER — Other Ambulatory Visit: Payer: Self-pay | Admitting: Urology

## 2023-07-28 ENCOUNTER — Telehealth: Payer: Self-pay | Admitting: Pharmacist

## 2023-07-28 ENCOUNTER — Other Ambulatory Visit: Payer: Self-pay | Admitting: Internal Medicine

## 2023-07-28 ENCOUNTER — Telehealth: Payer: Self-pay

## 2023-07-28 MED ORDER — OZEMPIC (2 MG/DOSE) 8 MG/3ML ~~LOC~~ SOPN: 2.0000 mg | PEN_INJECTOR | SUBCUTANEOUS | 3 refills | Status: DC

## 2023-07-28 NOTE — Telephone Encounter (Signed)
Patient called back and confirmed she is on 2 mg Ozempic once week.  Will send refill for 2 mg Ozempic.

## 2023-07-28 NOTE — Telephone Encounter (Signed)
Pre-operative Risk Assessment    Patient Name: [name]  DOB: 12-Aug-1961 MRN: [id number]   LAST OFFICE VISIT: 07/18/22 WITH DR. Lynnette Caffey NEXT OFFICE VISIT: 08/18/23 WITH Robin Searing, NP    Request for Surgical Clearance    Procedure:   CYSTO, RIGHT URETEROSCOPY, RETROGRADE PYELOGREM, LASER LITHO,STENT  Date of Surgery:  Clearance 08/10/23                                 Surgeon:  Rochele Pages Surgeon's Group or Practice Name:  [facility]  Phone number:  [phone] Fax number:  [phone]   Type of Clearance Requested:   - Pharmacy:  Hold Aspirin 5 DAYS PRIOR TO SURGERY   Type of Anesthesia:  Not Indicated   Additional requests/questions:    SignedMichaelle Copas   07/28/2023, 12:54 PM

## 2023-07-28 NOTE — Telephone Encounter (Signed)
Received refill request for Ozempic 2mg . Pt has been on 1mg  dose for past year. Called pt and left message to clarify which dose pt would like [place] to send in.

## 2023-07-28 NOTE — Telephone Encounter (Signed)
Spoke with  patient who is agreeable to see Joni Reining, NP on 9/27 at 8:50 am.

## 2023-07-28 NOTE — Telephone Encounter (Signed)
Pre-op team,   Will you please see if this patient's office visit can be moved up? She is currently scheduled for 10/11 with Robin Searing, NP. Her surgery is scheduled for 10/3. She has not been seen in the office in over a year.   Thank you!  DW

## 2023-07-28 NOTE — Addendum Note (Signed)
Addended by: Tylene Fantasia on: 07/28/2023 01:47 PM   Modules accepted: Orders

## 2023-07-28 NOTE — Telephone Encounter (Signed)
Pt has been on 1mg  dose for the past year bc was not able to get 2mg  dose from pharmacy. Called her to clarify which dose of Ozempic she would like sent to pharmacy, no answer, left voicemail.

## 2023-07-31 NOTE — Telephone Encounter (Signed)
Name: MARCILLE WALIGORSKI  DOB: 21-Apr-1961  MRN: [id number]  Primary Cardiologist: Orbie Pyo, MD  Chart reviewed as part of pre-operative protocol coverage. Because of Carlo M Traynor's past medical history and time since last visit, she will require a follow-up in-office visit in order to better assess preoperative cardiovascular risk.  Patient has an office visit scheduled on 08/04/2023 with Joni Reining, NP. Appointment notes have been updated to reflect need for pre-op evaluation.   Pre-op covering staff:  - Please contact requesting surgeon's office via preferred method (i.e, phone, fax) to inform them of need for appointment prior to surgery.   Carlos Levering, NP  07/31/2023, 7:10 AM

## 2023-08-02 ENCOUNTER — Other Ambulatory Visit: Payer: Self-pay | Admitting: Internal Medicine

## 2023-08-02 DIAGNOSIS — I10 Essential (primary) hypertension: Secondary | ICD-10-CM

## 2023-08-02 NOTE — Progress Notes (Signed)
Cardiology Clinic Note   Patient Name: [name] Date of Encounter: 08/04/2023  Primary Care Provider:  Heather Roberts, NP Primary Cardiologist:  Orbie Pyo, MD  Patient Profile    62-year-old female with history of hypertension, hyperlipidemia, aortic atherosclerosis, coronary artery calcifications on coronary CTA with only distal LAD being positive, she was to continue medical therapy.  Other history includes type 2 diabetes with Jardiance 10 mg daily, and Ozempic for weight loss due to elevated BMI.   Past Medical History    Past Medical History:  Diagnosis Date   Arthritis    Depression    Diabetes mellitus without complication (HCC)    Fibromyalgia    GERD (gastroesophageal reflux disease)    Graves disease    s/p radioactive therapy   Heart murmur    as child   Hypercholesteremia    Hypertension    Hypothyroidism    IBS (irritable bowel syndrome)    Kidney stone    Lupus (HCC)    Pancreatitis    Restless leg    Sleep apnea    Uvular swelling    pt states it happened after c-section but since then happens randomly at home.   Past Surgical History:  Procedure Laterality Date   BILATERAL SALPINGOOPHORECTOMY  2005   [facility] done by Dr. Despina Hidden for AUB, chronic pelvic pain   CESAREAN SECTION     x2   CHOLECYSTECTOMY     COLONOSCOPY  2006   RMR: internal/external hemorrhoids, proctitis   COLONOSCOPY N/A 02/06/2013   RMR: anal canal hemorrhoids, inadequate prep. Needs screening again in April 2015   COLONOSCOPY WITH PROPOFOL N/A 12/24/2015   Dr. Jena Gauss: redundant colonic, colonic diverticulosis, cecal AVMs, tubular adenoma, surveillance in 5 years   COLONOSCOPY WITH PROPOFOL N/A 10/29/2020    non-bleeding internal hemorrhoids, one 6 mm polyp in mid transverse colon, otherwise normal. Tubular adenoma, 5 year surveillance.    CYSTOSCOPY WITH RETROGRADE PYELOGRAM, URETEROSCOPY AND STENT PLACEMENT Right 01/21/2016   Procedure: RIGHT RETROGRADE, PYLEGRAM, RIGHT  URETEROSCOPY LITHOTRIPSY WITH STONE  BASKET EXTRACTION, STENT PLACEMENT;  Surgeon: [name], MD;  Location: [facility];  Service: Urology;  Laterality: Right;   ESOPHAGOGASTRODUODENOSCOPY (EGD) WITH PROPOFOL N/A 10/29/2020   normal esophagus, gastric petechia, normal duodenum.    LAPAROSCOPIC CHOLECYSTECTOMY W/ CHOLANGIOGRAPHY  2004   POLYPECTOMY  12/24/2015   Procedure: POLYPECTOMY;  Surgeon: Corbin Ade, MD;  Location: [facility];  Service: Endoscopy;;   POLYPECTOMY  10/29/2020   Procedure: POLYPECTOMY;  Surgeon: Corbin Ade, MD;  Location: [facility];  Service: Endoscopy;;   SUPRACERVICAL ABDOMINAL HYSTERECTOMY  2005    Allergies  No Known Allergies  History of Present Illness    [name] presents to the office today for preoperative cardiac evaluation for cystoscopy, right ureteroscopy, retrograde pyelogram, laser litho and stent placement per Dr. Berniece Salines, [facility], plan for 08/10/2023.  She denies chest pain, palpitations, dyspnea on exertion, edema, syncope or near syncope.  She continues to lose weight and is lost approximately 65 pounds.  She has stopped Ozempic for this past week in anticipation of her procedure.  EKG has been completed by provider on 08/03/2023 and will suffice for cardiology preop EKG.  Home Medications    Current Outpatient Medications  Medication Sig Dispense Refill   acetaminophen (TYLENOL) 500 MG tablet Take 500-1,000 mg by mouth every 6 (six) hours as needed (pain.).     amLODipine (NORVASC) 10 MG tablet Take  1 tablet (10 mg total) by mouth daily. (Patient taking differently: Take 10 mg by mouth daily in the afternoon.) 90 tablet 3   aspirin EC 81 MG tablet Take 81 mg by mouth daily in the afternoon.     atorvastatin (LIPITOR) 40 MG tablet TAKE 1/2 TABLET BY MOUTH DAILY 45 tablet 0   dexlansoprazole (DEXILANT) 60 MG capsule Take 1 capsule (60 mg total) by mouth daily. (Patient taking differently: Take 60 mg by mouth  daily in the afternoon.) 90 capsule 3   FLUoxetine (PROZAC) 40 MG capsule Take 40 mg by mouth daily in the afternoon.     hydrochlorothiazide (HYDRODIURIL) 25 MG tablet Take 25 mg by mouth daily in the afternoon.     hydroxychloroquine (PLAQUENIL) 200 MG tablet Take 200 mg by mouth daily in the afternoon.     levothyroxine (SYNTHROID) 137 MCG tablet Take 137 mcg by mouth daily before breakfast.      lisinopril (ZESTRIL) 40 MG tablet TAKE 1 TABLET BY MOUTH EVERY DAY 15 tablet 0   loratadine (CLARITIN) 10 MG tablet Take 10 mg by mouth daily in the afternoon.     MAGNESIUM PO Take 1 tablet by mouth daily in the afternoon.     metFORMIN (GLUCOPHAGE) 500 MG tablet Take 500 mg by mouth daily in the afternoon.     Probiotic Product (PROBIOTIC PO) Take 1 capsule by mouth daily in the afternoon.     RESTASIS 0.05 % ophthalmic emulsion Place 1 drop into both eyes daily as needed for dry eyes.     Semaglutide, 2 MG/DOSE, (OZEMPIC, 2 MG/DOSE,) 8 MG/3ML SOPN Inject 2 mg into the skin once a week. (Patient taking differently: Inject 2 mg into the skin every Monday.) 6 mL 3   tamsulosin (FLOMAX) 0.4 MG CAPS capsule Take 0.4 mg by mouth every evening.     vitamin E 180 MG (400 UNITS) capsule Take 400 Units by mouth daily in the afternoon.     albuterol (VENTOLIN HFA) 108 (90 Base) MCG/ACT inhaler Inhale 2 puffs into the lungs daily as needed for shortness of breath. (Patient not taking: Reported on 08/04/2023)     oxyCODONE-acetaminophen (PERCOCET/ROXICET) 5-325 MG tablet Take 1 tablet by mouth daily as needed (severe pain.). (Patient not taking: Reported on 08/04/2023)     No current facility-administered medications for this visit.     Family History    Family History  Adopted: Yes  Problem Relation Age of Onset   Colon cancer Father        patient was adopted, but she knows birth father. Believes he had colon cancer   Cancer Neg Hx    is adopted.   Social History    Social History   Socioeconomic  History   Marital status: Divorced    Spouse name: Not on file   Number of children: Not on file   Years of education: Not on file   Highest education level: Not on file  Occupational History   Not on file  Tobacco Use   Smoking status: Never   Smokeless tobacco: Never   Tobacco comments:    Never smoked  Vaping Use   Vaping status: Never Used  Substance and Sexual Activity   Alcohol use: No    Alcohol/week: 0.0 standard drinks of alcohol   Drug use: No   Sexual activity: Never    Birth control/protection: Surgical  Other Topics Concern   Not on file  Social History Narrative   Not on  file   Social Determinants of Health   Financial Resource Strain: Low Risk  (03/21/2023)   Received from [facility], [facility]   Overall Financial Resource Strain (CARDIA)    Difficulty of Paying Living Expenses: Not hard at all  Food Insecurity: No Food Insecurity (03/21/2023)   Received from [facility], [facility]   Hunger Vital Sign    Worried About Running Out of Food in the Last Year: Never true    Ran Out of Food in the Last Year: Never true  Transportation Needs: No Transportation Needs (03/21/2023)   Received from [facility], [facility]   PRAPARE - Transportation    Lack of Transportation (Medical): No    Lack of Transportation (Non-Medical): No  Physical Activity: Insufficiently Active (03/21/2023)   Received from [facility], [facility]   Exercise Vital Sign    Days of Exercise per Week: 5 days    Minutes of Exercise per Session: 20 min  Stress: No Stress Concern Present (03/21/2023)   Received from [facility], [facility] - Occupational Stress Questionnaire    Feeling of Stress : Only a little  Social Connections: Socially Integrated (03/21/2023)   Received from [facility], [facility]   Social Network    How would you rate your social network (family, work, friends)?: Good participation with social  networks  Intimate Partner Violence: Not At Risk (03/21/2023)   Received from [facility], [facility]   HITS    Over the last 12 months how often did your partner physically hurt you?: 1    Over the last 12 months how often did your partner insult you or talk down to you?: 1    Over the last 12 months how often did your partner threaten you with physical harm?: 1    Over the last 12 months how often did your partner scream or curse at you?: 1     Review of Systems    General:  No chills, fever, night sweats or weight changes.  Cardiovascular:  No chest pain, dyspnea on exertion, edema, orthopnea, palpitations, paroxysmal nocturnal dyspnea. Dermatological: No rash, lesions/masses Respiratory: No cough, dyspnea Urologic: No hematuria, dysuria Abdominal:   No nausea, vomiting, diarrhea, bright red blood per rectum, melena, or hematemesis Neurologic:  No visual changes, wkns, changes in mental status. All other systems reviewed and are otherwise negative except as noted above.       Physical Exam    VS:  BP 100/80 (BP Location: Left Arm, Patient Position: Sitting, Cuff Size: Normal)   Pulse 70   Ht 5\' 5"  (1.651 m)   Wt (!) 305 lb (138.3 kg)   SpO2 95%   BMI 50.75 kg/m  , BMI Body mass index is 50.75 kg/m.     GEN: Well nourished, well developed, in no acute distress. HEENT: normal. Neck: Supple, no JVD, carotid bruits, or masses. Cardiac: RRR, no murmurs, rubs, or gallops. No clubbing, cyanosis, edema.  Radials/DP/PT 2+ and equal bilaterally.  Respiratory:  Respirations regular and unlabored, clear to auscultation bilaterally. GI: Soft, nontender, nondistended, BS + x 4. MS: no deformity or atrophy. Skin: warm and dry, no rash. Neuro:  Strength and sensation are intact. Psych: Normal affect.    EKG 08/03/2023 Normal sinus rhythm Possible Anteroseptal infarct , age undetermined Abnormal ECG   Lab Results  Component Value Date   WBC 7.8 08/03/2023   HGB 15.1 (H)  08/03/2023   HCT 46.4 (  H) 08/03/2023   MCV 89.7 08/03/2023   PLT 225 08/03/2023   Lab Results  Component Value Date   CREATININE 0.97 08/03/2023   BUN 20 08/03/2023   NA 138 08/03/2023   K 4.0 08/03/2023   CL 102 08/03/2023   CO2 28 08/03/2023   Lab Results  Component Value Date   ALT 16 08/03/2023   AST 15 08/03/2023   ALKPHOS 72 08/03/2023   BILITOT 0.6 08/03/2023   No results found for: "CHOL", "HDL", "LDLCALC", "LDLDIRECT", "TRIG", "CHOLHDL"  Lab Results  Component Value Date   HGBA1C 5.4 08/03/2023     Review of Prior Studies    TTE 2023 Small pericardial effusion without tamponade, ejection fraction of 55 to 60% with normal diastolic function no significant valvular abnormalities   Coronary CTA 2023 1. Coronary calcium score of 1066. This was 68 percentile for age and sex matched control. 2. Normal coronary origin with right dominance. 3. CAD-RADS 3. Moderate stenosis. Consider symptom-guided anti-ischemic pharmacotherapy as well as risk factor modification per guideline directed care. Additional analysis with CT FFR will be submitted.   FFR CTA 2023 1. Left Main: FFR = 0.96 2. LAD: Proximal FFR = 0.96, mid FFR = 0.84, distal FFR = 0.66 - distal tip 3. LCX: Proximal FFR = 0.92, distal FFR = 0.92 4. RCA: Proximal FFR = 0.97, mid FFR =0.94, distal FFR = 0.92   CT 1/23 1. Negative examination for pulmonary embolism. 2. Trace right pleural effusion with bibasilar interlobular septal thickening, consistent with pulmonary edema. 3. Cardiomegaly with three-vessel coronary artery calcifications.    Assessment & Plan   1.  Preop clearance: According to the Revised Cardiac Risk Index (RCRI), her Perioperative Risk of Major Cardiac Event is (%): 0.4  Her Functional Capacity in METs is: 8.64 according to the [facility] Activity Status Index (DASI).   Per office protocol, if patient is without any new symptoms or concerns at the time of their virtual visit, he/she may  hold asa for 7 days prior to procedure. Please resume asa as soon as possible postprocedure, at the discretion of the surgeon.    Therefore, based on ACC/AHA guidelines, patient would be at acceptable risk for the planned procedure without further cardiovascular testing. I will route this recommendation to the requesting party via Epic fax function.   2.  Coronary artery disease: Status post coronary artery CTA revealing only distal LAD disease.  Continue secondary prevention with blood pressure control, lipid management, weight loss, and purposeful exercise.  3.  Hypertension: Excellent control of blood pressure today.  Continue HCTZ 25 mg, lisinopril and amlodipine 10 mg daily.  Labs should be completed on follow-up appointment if not done by primary care.  4.  Hypercholesterolemia: Remains on atorvastatin.  Goal of LDL less than 70 with CAD noted in the distal LAD.  Follow-up lipids and LFTs to be completed on next office visit if not been completed by primary care.       Signed, Bettey Mare. Liborio Nixon, ANP, AACC   08/04/2023 9:12 AM      Office [phone] Fax [phone]  Notice: This dictation was prepared with Dragon dictation along with smaller phrase technology. Any transcriptional errors that result from this process are unintentional and may not be corrected upon review.

## 2023-08-02 NOTE — Progress Notes (Addendum)
COVID Vaccine Completed: yes  Date of COVID positive in last 90 days:  PCP - Bjorn Pippin, NP Cardiologist - Alverda Skeans, MD  Cardiac clearance appointment scheduled for 08/04/23  Chest x-ray - n/a EKG - 08/03/23 Epic/chart Stress Test - n/a ECHO - 12/27/21 Epic Cardiac Cath - n/a Pacemaker/ICD device last checked: n/a Spinal Cord Stimulator: n/a  Bowel Prep - no  Sleep Study - yes CPAP - no  Fasting Blood Sugar -  Checks Blood Sugar  no checks at home  Last dose of GLP1 agonist-  Ozempic, takes Mondays GLP1 instructions:  Hold 7 days, last dose. 9/24/224. Do not take 08/07/23   Last dose of SGLT-2 inhibitors-  N/A SGLT-2 instructions: N/A   Blood Thinner Instructions:  Time Aspirin Instructions: ASA 81, hold 7 days Last Dose:  Activity level: Can go up a flight of stairs and perform activities of daily living without stopping and without symptoms of chest pain or shortness of breath.    Anesthesia review: coronary artery calcification, fatty liver, DM2, HTN, OSA, lupus  Patient denies shortness of breath, fever, cough and chest pain at PAT appointment  Patient verbalized understanding of instructions that were given to them at the PAT appointment. Patient was also instructed that they will need to review over the PAT instructions again at home before surgery.

## 2023-08-02 NOTE — Patient Instructions (Signed)
SURGICAL WAITING ROOM VISITATION  Patients having surgery or a procedure may have no more than 2 support people in the waiting area - these visitors may rotate.    Children under the age of 41 must have an adult with them who is not the patient.  Due to an increase in RSV and influenza rates and associated hospitalizations, children ages 18 and under may not visit patients in [facility] hospitals.  If the patient needs to stay at the hospital during part of their recovery, the visitor guidelines for inpatient rooms apply. Pre-op nurse will coordinate an appropriate time for 1 support person to accompany patient in pre-op.  This support person may not rotate.    Please refer to the [facility] website for the visitor guidelines for Inpatients (after your surgery is over and you are in a regular room).    Your procedure is scheduled on: 08/10/23   Report to [facility] Main Entrance    Report to admitting at 7:45 AM   Call this number if you have problems the morning of surgery [phone]   Do not eat food or drink liquids :After Midnight.          If you have questions, please contact your surgeon's office.   FOLLOW BOWEL PREP AND ANY ADDITIONAL PRE OP INSTRUCTIONS YOU RECEIVED FROM YOUR SURGEON'S OFFICE!!!     Oral Hygiene is also important to reduce your risk of infection.                                    Remember - BRUSH YOUR TEETH THE MORNING OF SURGERY WITH YOUR REGULAR TOOTHPASTE  DENTURES WILL BE REMOVED PRIOR TO SURGERY PLEASE DO NOT APPLY "Poly grip" OR ADHESIVES!!!   Stop all vitamins and herbal supplements 7 days before surgery.   Take these medicines the morning of surgery with A SIP OF WATER: Tylenol, Inhalers, Amlodipine, Atorvastatin, Dexilant, Levothyroxine, Loratadine, Percocet  DO NOT TAKE ANY ORAL DIABETIC MEDICATIONS DAY OF YOUR SURGERY  How to Manage Your Diabetes Before and After Surgery  Why is it important to control my blood sugar before  and after surgery? Improving blood sugar levels before and after surgery helps healing and can limit problems. A way of improving blood sugar control is eating a healthy diet by:  Eating less sugar and carbohydrates  Increasing activity/exercise  Talking with your doctor about reaching your blood sugar goals High blood sugars (greater than 180 mg/dL) can raise your risk of infections and slow your recovery, so you will need to focus on controlling your diabetes during the weeks before surgery. Make sure that the doctor who takes care of your diabetes knows about your planned surgery including the date and location.  How do I manage my blood sugar before surgery? Check your blood sugar at least 4 times a day, starting 2 days before surgery, to make sure that the level is not too high or low. Check your blood sugar the morning of your surgery when you wake up and every 2 hours until you get to the Short Stay unit. If your blood sugar is less than 70 mg/dL, you will need to treat for low blood sugar: Do not take insulin. Treat a low blood sugar (less than 70 mg/dL) with  cup of clear juice (cranberry or apple), 4 glucose tablets, OR glucose gel. Recheck blood sugar in 15 minutes after treatment (to  make sure it is greater than 70 mg/dL). If your blood sugar is not greater than 70 mg/dL on recheck, call [phone] for further instructions. Report your blood sugar to the short stay nurse when you get to Short Stay.  If you are admitted to the hospital after surgery: Your blood sugar will be checked by the staff and you will probably be given insulin after surgery (instead of oral diabetes medicines) to make sure you have good blood sugar levels. The goal for blood sugar control after surgery is 80-180 mg/dL.   WHAT DO I DO ABOUT MY DIABETES MEDICATION?  Do not take oral diabetes medicines (pills) the morning of surgery.  Do not take Ozempic 08/07/23. May resume after surgery.  THE DAY  BEFORE SURGERY, take Metformin as prescribed.      THE MORNING OF SURGERY, do not take Metformin.   DO NOT TAKE THE FOLLOWING 7 DAYS PRIOR TO SURGERY: Ozempic, Wegovy, Rybelsus (Semaglutide), Byetta (exenatide), Bydureon (exenatide ER), Victoza, Saxenda (liraglutide), or Trulicity (dulaglutide) Mounjaro (Tirzepatide) Adlyxin (Lixisenatide), Polyethylene Glycol Loxenatide.  Reviewed and Endorsed by [facility] Patient Education Committee, August 2015  Bring CPAP mask and tubing day of surgery.                              You may not have any metal on your body including hair pins, jewelry, and body piercing             Do not wear make-up, lotions, powders, perfumes, or deodorant  Do not wear nail polish including gel and S&S, artificial/acrylic nails, or any other type of covering on natural nails including finger and toenails. If you have artificial nails, gel coating, etc. that needs to be removed by a nail salon please have this removed prior to surgery or surgery may need to be canceled/ delayed if the surgeon/ anesthesia feels like they are unable to be safely monitored.   Do not shave  48 hours prior to surgery.    Do not bring valuables to the hospital. [facility] IS NOT             RESPONSIBLE   FOR VALUABLES.   Contacts, glasses, dentures or bridgework may not be worn into surgery.  DO NOT BRING YOUR HOME MEDICATIONS TO THE HOSPITAL. PHARMACY WILL DISPENSE MEDICATIONS LISTED ON YOUR MEDICATION LIST TO YOU DURING YOUR ADMISSION IN THE HOSPITAL!    Patients discharged on the day of surgery will not be allowed to drive home.  Someone NEEDS to stay with you for the first 24 hours after anesthesia.   Special Instructions: Bring a copy of your healthcare power of attorney and living will documents the day of surgery if you haven't scanned them before.              Please read over the following fact sheets you were given: IF YOU HAVE QUESTIONS ABOUT YOUR PRE-OP INSTRUCTIONS PLEASE  CALL [phone]   If you received a COVID test during your pre-op visit  it is requested that you wear a mask when out in public, stay away from anyone that may not be feeling well and notify your surgeon if you develop symptoms. If you test positive for Covid or have been in contact with anyone that has tested positive in the last 10 days please notify you surgeon.    [facility] - Preparing for Surgery Before surgery, you can play an important  role.  Because skin is not sterile, your skin needs to be as free of germs as possible.  You can reduce the number of germs on your skin by washing with CHG (chlorahexidine gluconate) soap before surgery.  CHG is an antiseptic cleaner which kills germs and bonds with the skin to continue killing germs even after washing. Please DO NOT use if you have an allergy to CHG or antibacterial soaps.  If your skin becomes reddened/irritated stop using the CHG and inform your nurse when you arrive at Short Stay. Do not shave (including legs and underarms) for at least 48 hours prior to the first CHG shower.  You may shave your face/neck.  Please follow these instructions carefully:  1.  Shower with CHG Soap the night before surgery and the  morning of surgery.  2.  If you choose to wash your hair, wash your hair first as usual with your normal  shampoo.  3.  After you shampoo, rinse your hair and body thoroughly to remove the shampoo.                             4.  Use CHG as you would any other liquid soap.  You can apply chg directly to the skin and wash.  Gently with a scrungie or clean washcloth.  5.  Apply the CHG Soap to your body ONLY FROM THE NECK DOWN.   Do   not use on face/ open                           Wound or open sores. Avoid contact with eyes, ears mouth and   genitals (private parts).                       Wash face,  Genitals (private parts) with your normal soap.             6.  Wash thoroughly, paying special attention to the area  where your    surgery  will be performed.  7.  Thoroughly rinse your body with warm water from the neck down.  8.  DO NOT shower/wash with your normal soap after using and rinsing off the CHG Soap.                9.  Pat yourself dry with a clean towel.            10.  Wear clean pajamas.            11.  Place clean sheets on your bed the night of your first shower and do not  sleep with pets. Day of Surgery : Do not apply any lotions/deodorants the morning of surgery.  Please wear clean clothes to the hospital/surgery center.  FAILURE TO FOLLOW THESE INSTRUCTIONS MAY RESULT IN THE CANCELLATION OF YOUR SURGERY  PATIENT SIGNATURE_________________________________  NURSE SIGNATURE__________________________________  ________________________________________________________________________

## 2023-08-03 ENCOUNTER — Encounter (HOSPITAL_COMMUNITY): Payer: Self-pay

## 2023-08-03 ENCOUNTER — Other Ambulatory Visit: Payer: Self-pay

## 2023-08-03 ENCOUNTER — Encounter (HOSPITAL_COMMUNITY)
Admission: RE | Admit: 2023-08-03 | Discharge: 2023-08-03 | Disposition: A | Discharge status: Home / Self Care | Payer: 59 | Source: Ambulatory Visit | Attending: Urology | Admitting: Urology

## 2023-08-03 VITALS — BP 136/91 | HR 73 | Temp 97.5°F | Resp 14 | Ht 65.0 in | Wt 303.0 lb

## 2023-08-03 DIAGNOSIS — Z01818 Encounter for other preprocedural examination: Secondary | ICD-10-CM | POA: Insufficient documentation

## 2023-08-03 DIAGNOSIS — E119 Type 2 diabetes mellitus without complications: Secondary | ICD-10-CM | POA: Diagnosis not present

## 2023-08-03 DIAGNOSIS — K760 Fatty (change of) liver, not elsewhere classified: Secondary | ICD-10-CM | POA: Diagnosis not present

## 2023-08-03 HISTORY — DX: Cardiac murmur, unspecified: R01.1

## 2023-08-03 LAB — COMPREHENSIVE METABOLIC PANEL
ALT: 16 U/L (ref 0–44)
AST: 15 U/L (ref 15–41)
Albumin: 4.4 g/dL (ref 3.5–5.0)
Alkaline Phosphatase: 72 U/L (ref 38–126)
Anion gap: 8 (ref 5–15)
BUN: 20 mg/dL (ref 6–20)
CO2: 28 mmol/L (ref 22–32)
Calcium: 9.4 mg/dL (ref 8.9–10.3)
Chloride: 102 mmol/L (ref 98–111)
Creatinine, Ser: 0.97 mg/dL (ref 0.44–1.00)
GFR, Estimated: >60 mL/min (ref >60)
Glucose, Bld: 102 mg/dL — ABNORMAL HIGH (ref 70–99)
Potassium: 4 mmol/L (ref 3.5–5.1)
Sodium: 138 mmol/L (ref 135–145)
Total Bilirubin: 0.6 mg/dL (ref 0.3–1.2)
Total Protein: 7.5 g/dL (ref 6.5–8.1)

## 2023-08-03 LAB — HEMOGLOBIN A1C
Hgb A1c MFr Bld: 5.4 % (ref 4.8–5.6)
Mean Plasma Glucose: 108.28 mg/dL

## 2023-08-03 LAB — GLUCOSE, CAPILLARY: Glucose-Capillary: 91 mg/dL (ref 70–99)

## 2023-08-03 LAB — CBC
HCT: 46.4 % — ABNORMAL HIGH (ref 36.0–46.0)
Hemoglobin: 15.1 g/dL — ABNORMAL HIGH (ref 12.0–15.0)
MCH: 29.2 pg (ref 26.0–34.0)
MCHC: 32.5 g/dL (ref 30.0–36.0)
MCV: 89.7 fL (ref 80.0–100.0)
Platelets: 225 10*3/uL (ref 150–400)
RBC: 5.17 MIL/uL — ABNORMAL HIGH (ref 3.87–5.11)
RDW: 12.6 % (ref 11.5–15.5)
WBC: 7.8 10*3/uL (ref 4.0–10.5)
nRBC: 0 % (ref 0.0–0.2)

## 2023-08-04 ENCOUNTER — Encounter: Payer: Self-pay | Admitting: Adult Health

## 2023-08-04 ENCOUNTER — Ambulatory Visit: Payer: 59 | Attending: Nurse Practitioner | Admitting: Adult Health

## 2023-08-04 VITALS — BP 100/80 | HR 70 | Ht 65.0 in | Wt 305.0 lb

## 2023-08-04 DIAGNOSIS — I10 Essential (primary) hypertension: Secondary | ICD-10-CM

## 2023-08-04 DIAGNOSIS — I2510 Atherosclerotic heart disease of native coronary artery without angina pectoris: Secondary | ICD-10-CM

## 2023-08-04 DIAGNOSIS — Z01818 Encounter for other preprocedural examination: Secondary | ICD-10-CM

## 2023-08-04 DIAGNOSIS — E7800 Pure hypercholesterolemia, unspecified: Secondary | ICD-10-CM | POA: Diagnosis not present

## 2023-08-04 NOTE — Patient Instructions (Signed)
Medication Instructions:  NONE    Lab Work: NONE    Testing/Procedures: NONE   Follow-Up: At [facility], you and your health needs are our priority.  As part of our continuing mission to provide you with exceptional heart care, we have created designated Provider Care Teams.  These Care Teams include your primary Cardiologist (physician) and Advanced Practice Providers (APPs -  Physician Assistants and Nurse Practitioners) who all work together to provide you with the care you need, when you need it.    Your next appointment:   6 month(s)   Provider:   Joni Reining

## 2023-08-04 NOTE — Anesthesia Preprocedure Evaluation (Signed)
Anesthesia Evaluation  Patient identified by MRN, date of birth, ID band Patient awake    Reviewed: Allergy & Precautions, NPO status , Patient's Chart, lab work & pertinent test results  Airway Mallampati: I  TM Distance: >3 FB Neck ROM: Full    Dental no notable dental hx. (+) Teeth Intact, Dental Advisory Given   Pulmonary sleep apnea and Continuous Positive Airway Pressure Ventilation    Pulmonary exam normal breath sounds clear to auscultation       Cardiovascular hypertension, Pt. on medications Normal cardiovascular exam Rhythm:Regular Rate:Normal     Neuro/Psych  Neuromuscular disease    GI/Hepatic ,GERD  ,,  Endo/Other  diabetes, Type 2Hypothyroidism  Last dose of ozempic 9/23  Renal/GU Renal diseaseLab Results      Component                Value               Date                      NA                       138                 08/03/2023                CL                       102                 08/03/2023                K                        4.0                 08/03/2023                CO2                      28                  08/03/2023                BUN                      20                  08/03/2023                CREATININE               0.97                08/03/2023                GFRNONAA                 >60                 08/03/2023                CALCIUM                  9.4                 08/03/2023  ALBUMIN                  4.4                 08/03/2023                GLUCOSE                  102 (H)             08/03/2023                Musculoskeletal  (+) Arthritis ,  Fibromyalgia -  Abdominal   Peds  Hematology Lab Results      Component                Value               Date                           HGB                      15.1 (H)            08/03/2023                     PLT                      225                 08/03/2023              Anesthesia  Other Findings   Reproductive/Obstetrics                             Anesthesia Physical Anesthesia Plan  ASA: 3  Anesthesia Plan: General   Post-op Pain Management: Minimal or no pain anticipated   Induction: Intravenous  PONV Risk Score and Plan: 3 and Treatment may vary due to age or medical condition, Midazolam, Dexamethasone and Ondansetron  Airway Management Planned: LMA  Additional Equipment: None  Intra-op Plan:   Post-operative Plan: Extubation in OR  Informed Consent: I have reviewed the patients History and Physical, chart, labs and discussed the procedure including the risks, benefits and alternatives for the proposed anesthesia with the patient or authorized representative who has indicated his/her understanding and acceptance.     Dental advisory given  Plan Discussed with: CRNA  Anesthesia Plan Comments: (See PAT note 08/03/2023  Proseal LMA GA)       Anesthesia Quick Evaluation

## 2023-08-04 NOTE — Progress Notes (Signed)
Anesthesia Chart Review   Case: [id number] Date/Time: 08/10/23 0945   Procedure: CYSTOSCOPY, RIGHT RETROGRADE PYELOGRAM, RIGHT URETEROSCOPY, HOLMIUM LASER LITHOTRIPSY, BASKET EXTRACTION, RIGHT URETERAL STENT PLACEMENT (Right) - 60 MINUTES   Anesthesia type: Choice   Pre-op diagnosis: RIGHT URETERAL CALCULI   Location: [facility]   Surgeons: [name], MD       DISCUSSION:62 y.o. never smoker with h/o HTN, Lupus, Graves disease, sleep apnea, DM II, right ureteral calculi scheduled for above procedure 08/10/2023 with Dr. Berniece Salines.   Per cardiology preoperative evaluation 08/04/2023, "According to the Revised Cardiac Risk Index (RCRI), her Perioperative Risk of Major Cardiac Event is (%): 0.4   Her Functional Capacity in METs is: 8.64 according to the Duke Activity Status Index (DASI).    Per office protocol, if patient is without any new symptoms or concerns at the time of their virtual visit, he/she may hold asa for 7 days prior to procedure. Please resume asa as soon as possible postprocedure, at the discretion of the surgeon.     Therefore, based on ACC/AHA guidelines, patient would be at acceptable risk for the planned procedure without further cardiovascular testing. I will route this recommendation to the requesting party via Epic fax function. "    VS: BP (!) 136/91   Pulse 73   Temp (!) 36.4 C (Oral)   Resp 14   Ht 5\' 5"  (1.651 m)   Wt (!) 137.4 kg   SpO2 96%   BMI 50.42 kg/m   PROVIDERS: Heather Roberts, NP is PCP   Primary Cardiologist:  Orbie Pyo, MD  LABS: Labs reviewed: Acceptable for surgery. (all labs ordered are listed, but only abnormal results are displayed)  Labs Reviewed  COMPREHENSIVE METABOLIC PANEL - Abnormal; Notable for the following components:      Result Value   Glucose, Bld 102 (*)    All other components within normal limits  CBC - Abnormal; Notable for the following components:   RBC 5.17 (*)     Hemoglobin 15.1 (*)    HCT 46.4 (*)    All other components within normal limits  HEMOGLOBIN A1C  GLUCOSE, CAPILLARY     IMAGES:   EKG:   CV: Coronary CTA 2023 1. Coronary calcium score of 1066. This was 44 percentile for age and sex matched control. 2. Normal coronary origin with right dominance. 3. CAD-RADS 3. Moderate stenosis. Consider symptom-guided anti-ischemic pharmacotherapy as well as risk factor modification per guideline directed care. Additional analysis with CT FFR will be submitted.  Echo 12/27/2021 1. A small pericardial effusion is present. The pericardial effusion is  circumferential. There is no evidence of cardiac tamponade.   2. Left ventricular ejection fraction, by estimation, is 55 to 60%. The  left ventricle has normal function. The left ventricle has no regional  wall motion abnormalities. Left ventricular diastolic parameters were  normal.   3. Right ventricular systolic function is normal. The right ventricular  size is normal. Tricuspid regurgitation signal is inadequate for assessing  PA pressure.   4. The mitral valve is grossly normal. Trivial mitral valve  regurgitation. No evidence of mitral stenosis.   5. The aortic valve was not well visualized. Aortic valve regurgitation  is not visualized. No aortic stenosis is present.  Past Medical History:  Diagnosis Date   Arthritis    Depression    Diabetes mellitus without complication (HCC)    Fibromyalgia    GERD (gastroesophageal reflux disease)  Graves disease    s/p radioactive therapy   Heart murmur    as child   Hypercholesteremia    Hypertension    Hypothyroidism    IBS (irritable bowel syndrome)    Kidney stone    Lupus (HCC)    Pancreatitis    Restless leg    Sleep apnea    Uvular swelling    pt states it happened after c-section but since then happens randomly at home.    Past Surgical History:  Procedure Laterality Date   BILATERAL SALPINGOOPHORECTOMY  2005    [facility] [place] done by Dr. Despina Hidden for AUB, chronic pelvic pain   CESAREAN SECTION     x2   CHOLECYSTECTOMY     COLONOSCOPY  2006   RMR: internal/external hemorrhoids, proctitis   COLONOSCOPY N/A 02/06/2013   RMR: anal canal hemorrhoids, inadequate prep. Needs screening again in April 2015   COLONOSCOPY WITH PROPOFOL N/A 12/24/2015   Dr. Jena Gauss: redundant colonic, colonic diverticulosis, cecal AVMs, tubular adenoma, surveillance in 5 years   COLONOSCOPY WITH PROPOFOL N/A 10/29/2020    non-bleeding internal hemorrhoids, one 6 mm polyp in mid transverse colon, otherwise normal. Tubular adenoma, 5 year surveillance.    CYSTOSCOPY WITH RETROGRADE PYELOGRAM, URETEROSCOPY AND STENT PLACEMENT Right 01/21/2016   Procedure: RIGHT RETROGRADE, PYLEGRAM, RIGHT URETEROSCOPY LITHOTRIPSY WITH STONE  BASKET EXTRACTION, STENT PLACEMENT;  Surgeon: [name], MD;  Location: [facility];  Service: Urology;  Laterality: Right;   ESOPHAGOGASTRODUODENOSCOPY (EGD) WITH PROPOFOL N/A 10/29/2020   normal esophagus, gastric petechia, normal duodenum.    LAPAROSCOPIC CHOLECYSTECTOMY W/ CHOLANGIOGRAPHY  2004   POLYPECTOMY  12/24/2015   Procedure: POLYPECTOMY;  Surgeon: Corbin Ade, MD;  Location: [facility];  Service: Endoscopy;;   POLYPECTOMY  10/29/2020   Procedure: POLYPECTOMY;  Surgeon: Corbin Ade, MD;  Location: [facility];  Service: Endoscopy;;   SUPRACERVICAL ABDOMINAL HYSTERECTOMY  2005    MEDICATIONS:  acetaminophen (TYLENOL) 500 MG tablet   albuterol (VENTOLIN HFA) 108 (90 Base) MCG/ACT inhaler   amLODipine (NORVASC) 10 MG tablet   aspirin EC 81 MG tablet   atorvastatin (LIPITOR) 40 MG tablet   dexlansoprazole (DEXILANT) 60 MG capsule   FLUoxetine (PROZAC) 40 MG capsule   hydrochlorothiazide (HYDRODIURIL) 25 MG tablet   hydroxychloroquine (PLAQUENIL) 200 MG tablet   levothyroxine (SYNTHROID) 137 MCG tablet   lisinopril (ZESTRIL) 40 MG tablet   loratadine (CLARITIN) 10 MG tablet   MAGNESIUM PO    metFORMIN (GLUCOPHAGE) 500 MG tablet   oxyCODONE-acetaminophen (PERCOCET/ROXICET) 5-325 MG tablet   Probiotic Product (PROBIOTIC PO)   RESTASIS 0.05 % ophthalmic emulsion   Semaglutide, 2 MG/DOSE, (OZEMPIC, 2 MG/DOSE,) 8 MG/3ML SOPN   tamsulosin (FLOMAX) 0.4 MG CAPS capsule   vitamin E 180 MG (400 UNITS) capsule   No current facility-administered medications for this encounter.     Jodell Cipro Ward, PA-C [facility] [phone]

## 2023-08-10 ENCOUNTER — Ambulatory Visit (HOSPITAL_COMMUNITY)
Admission: RE | Admit: 2023-08-10 | Discharge: 2023-08-10 | Disposition: A | Discharge status: Home / Self Care | Payer: 59 | Source: Ambulatory Visit | Attending: Urology | Admitting: Urology

## 2023-08-10 ENCOUNTER — Encounter (HOSPITAL_COMMUNITY): Payer: Self-pay | Admitting: Urology

## 2023-08-10 ENCOUNTER — Ambulatory Visit (HOSPITAL_BASED_OUTPATIENT_CLINIC_OR_DEPARTMENT_OTHER): Payer: 59 | Admitting: Anesthesiology

## 2023-08-10 ENCOUNTER — Encounter (HOSPITAL_COMMUNITY)
Admission: RE | Disposition: A | Discharge status: Home / Self Care | Payer: Self-pay | Source: Ambulatory Visit | Attending: Urology

## 2023-08-10 ENCOUNTER — Ambulatory Visit (HOSPITAL_COMMUNITY): Payer: 59 | Admitting: Physician Assistant

## 2023-08-10 ENCOUNTER — Ambulatory Visit (HOSPITAL_COMMUNITY): Payer: 59

## 2023-08-10 ENCOUNTER — Other Ambulatory Visit (HOSPITAL_COMMUNITY): Payer: Self-pay

## 2023-08-10 DIAGNOSIS — I10 Essential (primary) hypertension: Secondary | ICD-10-CM | POA: Insufficient documentation

## 2023-08-10 DIAGNOSIS — E1122 Type 2 diabetes mellitus with diabetic chronic kidney disease: Secondary | ICD-10-CM | POA: Diagnosis not present

## 2023-08-10 DIAGNOSIS — N201 Calculus of ureter: Secondary | ICD-10-CM

## 2023-08-10 DIAGNOSIS — K219 Gastro-esophageal reflux disease without esophagitis: Secondary | ICD-10-CM | POA: Insufficient documentation

## 2023-08-10 DIAGNOSIS — Z7984 Long term (current) use of oral hypoglycemic drugs: Secondary | ICD-10-CM | POA: Insufficient documentation

## 2023-08-10 DIAGNOSIS — I129 Hypertensive chronic kidney disease with stage 1 through stage 4 chronic kidney disease, or unspecified chronic kidney disease: Secondary | ICD-10-CM | POA: Diagnosis not present

## 2023-08-10 DIAGNOSIS — N200 Calculus of kidney: Secondary | ICD-10-CM

## 2023-08-10 DIAGNOSIS — N189 Chronic kidney disease, unspecified: Secondary | ICD-10-CM | POA: Diagnosis not present

## 2023-08-10 DIAGNOSIS — G4730 Sleep apnea, unspecified: Secondary | ICD-10-CM | POA: Diagnosis not present

## 2023-08-10 DIAGNOSIS — N202 Calculus of kidney with calculus of ureter: Secondary | ICD-10-CM | POA: Insufficient documentation

## 2023-08-10 DIAGNOSIS — E119 Type 2 diabetes mellitus without complications: Secondary | ICD-10-CM | POA: Diagnosis not present

## 2023-08-10 HISTORY — PX: CYSTOSCOPY/URETEROSCOPY/HOLMIUM LASER/STENT PLACEMENT: SHX6546

## 2023-08-10 LAB — GLUCOSE, CAPILLARY
Glucose-Capillary: 112 mg/dL — ABNORMAL HIGH (ref 70–99)
Glucose-Capillary: 100 mg/dL — ABNORMAL HIGH (ref 70–99)
Glucose-Capillary: 122 mg/dL — ABNORMAL HIGH (ref 70–99)

## 2023-08-10 SURGERY — CYSTOSCOPY/URETEROSCOPY/HOLMIUM LASER/STENT PLACEMENT
Anesthesia: General | Laterality: Right

## 2023-08-10 MED ORDER — OXYCODONE HCL 5 MG PO TABS: 5.0000 mg | ORAL_TABLET | Freq: Once | ORAL | Status: DC | PRN

## 2023-08-10 MED ORDER — PROPOFOL 10 MG/ML IV BOLUS
INTRAVENOUS | Status: DC | PRN
Start: 2023-08-10 — End: 2023-08-10
  Administered 2023-08-10: 200 mg via INTRAVENOUS
  Administered 2023-08-10: 30 mg via INTRAVENOUS

## 2023-08-10 MED ORDER — ACETAMINOPHEN 10 MG/ML IV SOLN: 1000.0000 mg | Freq: Once | INTRAVENOUS | Status: DC | PRN

## 2023-08-10 MED ORDER — PROPOFOL 10 MG/ML IV BOLUS
INTRAVENOUS | Status: AC
  Filled 2023-08-10: qty 20

## 2023-08-10 MED ORDER — HYDROMORPHONE HCL 1 MG/ML IJ SOLN
INTRAMUSCULAR | Status: AC
  Filled 2023-08-10: qty 1

## 2023-08-10 MED ORDER — MIDAZOLAM HCL 2 MG/2ML IJ SOLN
INTRAMUSCULAR | Status: AC
  Filled 2023-08-10: qty 2

## 2023-08-10 MED ORDER — ONDANSETRON HCL 4 MG/2ML IJ SOLN
INTRAMUSCULAR | Status: AC
  Filled 2023-08-10: qty 2

## 2023-08-10 MED ORDER — DROPERIDOL 2.5 MG/ML IJ SOLN: 0.6250 mg | Freq: Once | INTRAMUSCULAR | Status: DC | PRN

## 2023-08-10 MED ORDER — FENTANYL CITRATE (PF) 100 MCG/2ML IJ SOLN
INTRAMUSCULAR | Status: AC
  Filled 2023-08-10: qty 2

## 2023-08-10 MED ORDER — CEFAZOLIN IN SODIUM CHLORIDE 3-0.9 GM/100ML-% IV SOLN
3.0000 g | INTRAVENOUS | Status: AC
  Administered 2023-08-10: 3 g via INTRAVENOUS
  Filled 2023-08-10: qty 100

## 2023-08-10 MED ORDER — DEXAMETHASONE SODIUM PHOSPHATE 10 MG/ML IJ SOLN
INTRAMUSCULAR | Status: DC | PRN
  Administered 2023-08-10: 10 mg via INTRAVENOUS

## 2023-08-10 MED ORDER — DEXAMETHASONE SODIUM PHOSPHATE 10 MG/ML IJ SOLN
INTRAMUSCULAR | Status: AC
  Filled 2023-08-10: qty 1

## 2023-08-10 MED ORDER — LIDOCAINE 2% (20 MG/ML) 5 ML SYRINGE
INTRAMUSCULAR | Status: DC | PRN
  Administered 2023-08-10: 100 mg via INTRAVENOUS

## 2023-08-10 MED ORDER — MIDAZOLAM HCL 5 MG/5ML IJ SOLN
INTRAMUSCULAR | Status: DC | PRN
  Administered 2023-08-10: 2 mg via INTRAVENOUS

## 2023-08-10 MED ORDER — FENTANYL CITRATE (PF) 100 MCG/2ML IJ SOLN
INTRAMUSCULAR | Status: DC | PRN
  Administered 2023-08-10 (×2): 50 ug via INTRAVENOUS

## 2023-08-10 MED ORDER — ONDANSETRON HCL 4 MG/2ML IJ SOLN
INTRAMUSCULAR | Status: DC | PRN
  Administered 2023-08-10: 4 mg via INTRAVENOUS

## 2023-08-10 MED ORDER — ORAL CARE MOUTH RINSE: 15.0000 mL | Freq: Once | OROMUCOSAL | Status: AC

## 2023-08-10 MED ORDER — CHLORHEXIDINE GLUCONATE 0.12 % MT SOLN
15.0000 mL | Freq: Once | OROMUCOSAL | Status: AC
  Administered 2023-08-10: 15 mL via OROMUCOSAL

## 2023-08-10 MED ORDER — PHENAZOPYRIDINE HCL 200 MG PO TABS
200.0000 mg | ORAL_TABLET | Freq: Three times a day (TID) | ORAL | 0 refills | Status: AC | PRN
  Filled 2023-08-10: qty 10, 4d supply, fill #0

## 2023-08-10 MED ORDER — CIPROFLOXACIN HCL 500 MG PO TABS
500.0000 mg | ORAL_TABLET | Freq: Once | ORAL | 0 refills | Status: AC
  Filled 2023-08-10: qty 1, 1d supply, fill #0

## 2023-08-10 MED ORDER — LACTATED RINGERS IV SOLN: INTRAVENOUS | Status: DC

## 2023-08-10 MED ORDER — OXYCODONE HCL 5 MG/5ML PO SOLN: 5.0000 mg | Freq: Once | ORAL | Status: DC | PRN

## 2023-08-10 MED ORDER — ONDANSETRON HCL 4 MG/2ML IJ SOLN: 4.0000 mg | Freq: Once | INTRAMUSCULAR | Status: DC | PRN

## 2023-08-10 MED ORDER — IOHEXOL 300 MG/ML  SOLN
INTRAMUSCULAR | Status: DC | PRN
  Administered 2023-08-10: 10 mL

## 2023-08-10 MED ORDER — HYDROMORPHONE HCL 1 MG/ML IJ SOLN
0.2500 mg | INTRAMUSCULAR | Status: DC | PRN
  Administered 2023-08-10 (×2): 0.5 mg via INTRAVENOUS

## 2023-08-10 MED ORDER — SODIUM CHLORIDE 0.9 % IR SOLN
Status: DC | PRN
  Administered 2023-08-10: 6000 mL

## 2023-08-10 MED ORDER — LIDOCAINE HCL (PF) 2 % IJ SOLN
INTRAMUSCULAR | Status: AC
  Filled 2023-08-10: qty 5

## 2023-08-10 SURGICAL SUPPLY — 26 items
BAG URO CATCHER STRL LF (MISCELLANEOUS) ×1 IMPLANT
BASKET STONE 1.7 NGAGE (UROLOGICAL SUPPLIES) IMPLANT
BASKET ZERO TIP NITINOL 2.4FR (BASKET) IMPLANT
BSKT STON RTRVL ZERO TP 2.4FR (BASKET)
CATH URETL OPEN 5X70 (CATHETERS) ×1 IMPLANT
CLOTH BEACON ORANGE TIMEOUT ST (SAFETY) ×1 IMPLANT
EXTRACTOR STONE 1.7FRX115CM (UROLOGICAL SUPPLIES) IMPLANT
GLOVE SURG LX STRL 7.5 STRW (GLOVE) ×1 IMPLANT
GOWN STRL REUS W/ TWL XL LVL3 (GOWN DISPOSABLE) ×1 IMPLANT
GOWN STRL REUS W/TWL XL LVL3 (GOWN DISPOSABLE) ×1
GUIDEWIRE ANG ZIPWIRE 038X150 (WIRE) IMPLANT
GUIDEWIRE STR DUAL SENSOR (WIRE) ×1 IMPLANT
KIT TURNOVER KIT A (KITS) IMPLANT
LASER FIB FLEXIVA PULSE ID 365 (Laser) IMPLANT
MANIFOLD NEPTUNE II (INSTRUMENTS) ×1 IMPLANT
PACK CYSTO (CUSTOM PROCEDURE TRAY) ×1 IMPLANT
PAD PREP 24X48 CUFFED NSTRL (MISCELLANEOUS) ×1 IMPLANT
SCOPE LITHOVUE DISP (UROLOGICAL SUPPLIES) IMPLANT
SCOPE LITHOVUE DISPOSABLE (UROLOGICAL SUPPLIES) ×1
SHEATH NAVIGATOR HD 12/14X28 (SHEATH) IMPLANT
SHEATH NAVIGATOR HD 12/14X36 (SHEATH) IMPLANT
STENT URET 6FRX24 CONTOUR (STENTS) IMPLANT
TRACTIP FLEXIVA PULS ID 200XHI (Laser) IMPLANT
TRACTIP FLEXIVA PULSE ID 200 (Laser)
TUBING CONNECTING 10 (TUBING) ×1 IMPLANT
TUBING UROLOGY SET (TUBING) ×1 IMPLANT

## 2023-08-10 NOTE — Interval H&P Note (Signed)
History and Physical Interval Note:  08/10/2023 10:12 AM  [name]  has presented today for surgery, with the diagnosis of RIGHT URETERAL CALCULI.  The various methods of treatment have been discussed with the patient and family. After consideration of risks, benefits and other options for treatment, the patient has consented to  Procedure(s) with comments: CYSTOSCOPY, RIGHT RETROGRADE PYELOGRAM, RIGHT URETEROSCOPY, HOLMIUM LASER LITHOTRIPSY, BASKET EXTRACTION, RIGHT URETERAL STENT PLACEMENT (Right) - 60 MINUTES as a surgical intervention.  The patient's history has been reviewed, patient examined, no change in status, stable for surgery.  I have reviewed the patient's chart and labs.  Questions were answered to the patient's satisfaction.     [name]

## 2023-08-10 NOTE — H&P (Signed)
07/05/2023: New/old patient. Past history of nephrolithiasis undergoing ureteroscopy back in 2017. Now back today for follow-up evaluation due to concerns for possible stone event. Last imaging available for review is from October of last year. This showed 2 punctate right renal calculi and a 2 mm nonobstructing right proximal ureteral calculi. CT imaging also indicative of colitis at that exam, patient was unaware of a stone event occurring at that time when she was in the emergency department. UA today within normal limits.   Complaining of 3 to 4-day history of right lower back pain radiating into the flank on the same side. Also associated with intermittent nausea, increased frequency and intermittency of stream. She has been nauseous but no vomiting. Denies fevers or chills. She cannot recall the last time she passed a stone. She states today's symptoms feel like prior stone events.   07/20/2023: CT imaging at last visit showed minimally obstructing punctate appearing calculi in the right proximal ureter. Medical expulsive therapy initiated. Urine culture assessed was negative for bacterial growth. Now back today for follow-up evaluation. Today denies interval stone material passage. She continues to have intermittent severe right lower back and flank pain but pain is typically short-lived per her report. Also continues to be intermittently nauseous. She continues with intermittent urinary frequency, intermittently intermittent stream. Denies dysuria or gross hematuria.     ALLERGIES: No Allergies    MEDICATIONS: Hydrochlorothiazide  Levothyroxine Sodium  Lisinopril  Metformin Hcl  Tamsulosin Hcl 0.4 mg capsule 1 capsule PO Daily  Amlodipine Besylate  Atorvastatin Calcium  Dexlansoprazole Dr  Fluoxetine Hcl  Hydroxychloroquine Sulfate 200 mg tablet  Ondansetron Hcl 4 mg tablet 1 tablet PO Q 6 H PRN Take for nausea  Oxycodone-Acetaminophen 5 mg-325 mg tablet 1 tablet PO Q 6 H PRN  Ozempic      GU PSH: Hysterectomy Unilat SO - 2011 Ureteroscopic laser litho - 2017       PSH Notes: Cystoscopy Ureteroscopy Lithotripsy Incl Insert Indwelling Ureter Stent, Hysterectomy, Cesarean Section, Cholecystectomy   NON-GU PSH: Cesarean Delivery Only - 2011 Cholecystectomy (open) - 2011 Visit Complexity (formerly GPC1X) - 07/05/2023     GU PMH: Flank Pain - 07/05/2023 Renal calculus - 07/05/2023, Nephrolithiasis, - 2017 Ureteral calculus (Stable) - 07/05/2023, Calculus of right ureter, - 2017      PMH Notes:  2009-11-09 11:35:19 - Note: Graves' Disease   NON-GU PMH: Encounter for general adult medical examination without abnormal findings, Encounter for preventive health examination - 2017 Personal history of other diseases of the digestive system, History of irritable bowel syndrome - 2015 Personal history of other diseases of the musculoskeletal system and connective tissue, History of arthritis - 2015, History of systemic lupus erythematosus (SLE), - 2014 Personal history of other endocrine, nutritional and metabolic disease, History of diabetes mellitus - 2015, History of hypercholesterolemia, - 2015, History of hyperthyroidism, - 2015 Restless legs syndrome, Restless leg - 2015 Personal history of other diseases of the circulatory system, History of hypertension - 2014 Personal history of other diseases of the nervous system and sense organs, History of sleep apnea - 2014 Personal history of other mental and behavioral disorders, History of depression - 2014 Personal history of other specified conditions, History of heartburn - 2014 Arthritis Depression Diabetes Type 2 GERD Hypercholesterolemia Hypertension Hyperthyroidism Sleep Apnea    FAMILY HISTORY: 2 sons - Other Family Health Status Number - Runs In Family Father Deceased At Age20 ___ - Runs In Family malignant neoplasm of prostate - Runs In Family  Mother Deceased At Age 59 from diabetic complicati - Runs In Family    SOCIAL HISTORY: Marital Status: Divorced Preferred Language: English; Race: White Current Smoking Status: Patient has never smoked.   Tobacco Use Assessment Completed: Used Tobacco in last 30 days? Drinks 2 caffeinated drinks per day.     Notes: Caffeine Use, Occupation, Never a smoker, Alcohol Use, Tobacco Use, Marital History - Divorced   REVIEW OF SYSTEMS:    GU Review Female:   Patient reports get up at night to urinate. Patient denies frequent urination, hard to postpone urination, burning /pain with urination, leakage of urine, stream starts and stops, trouble starting your stream, have to strain to urinate, and being pregnant.  Gastrointestinal (Upper):   Patient denies nausea, vomiting, and indigestion/ heartburn.  Gastrointestinal (Lower):   Patient denies diarrhea and constipation.  Constitutional:   Patient denies fever, night sweats, weight loss, and fatigue.  Skin:   Patient denies skin rash/ lesion and itching.  Eyes:   Patient denies blurred vision and double vision.  Ears/ Nose/ Throat:   Patient denies sinus problems and sore throat.  Hematologic/Lymphatic:   Patient denies swollen glands and easy bruising.  Cardiovascular:   Patient denies leg swelling and chest pains.  Respiratory:   Patient denies cough and shortness of breath.  Endocrine:   Patient denies excessive thirst.  Musculoskeletal:   Patient reports back pain. Patient denies joint pain.  Neurological:   Patient denies headaches and dizziness.  Psychologic:   Patient denies depression and anxiety.   VITAL SIGNS:      07/20/2023 02:13 PM  Weight 304 lb / 137.89 kg  Height 65 in / 165.1 cm  BP 107/56 mmHg  Heart Rate 79 /min  Temperature 98.2 F / 36.7 C  BMI 50.6 kg/m   MULTI-SYSTEM PHYSICAL EXAMINATION:    Constitutional: Obese. No physical deformities. Normally developed. Good grooming.   Neck: Neck symmetrical, not swollen. Normal tracheal position.  Respiratory: No labored breathing, no use of  accessory muscles.   Cardiovascular: Normal temperature, normal extremity pulses, no swelling, no varicosities.  Skin: No paleness, no jaundice, no cyanosis. No lesion, no ulcer, no rash.  Neurologic / Psychiatric: Oriented to time, oriented to place, oriented to person. No depression, no anxiety, no agitation.  Gastrointestinal: Obese abdomen. No mass, no tenderness, no rigidity. No CVA or flank tenderness  Musculoskeletal: Normal gait and station of head and neck.     Complexity of Data:  Source Of History:  Patient, Medical Record Summary  Records Review:   Previous Doctor Records, Previous Hospital Records, Previous Patient Records  Urine Test Review:   Urinalysis, Urine Culture  X-Ray Review: Renal Ultrasound (Limited): Reviewed Films. Discussed With Patient.  C.T. Abdomen/Pelvis: Reviewed Films. Reviewed Report.     PROCEDURES:         Renal Ultrasound (Limited) - 16109  Kidney: Right Length: 11.55cm Depth: 6.84cm Cortical Width: 1.41 cm Width: 6.14cm    Right Kidney/Ureter:  no hydro, non obstructive calc 0.41cm  Bladder:  empty      . Patient confirmed No Neulasta OnPro Device.           Urinalysis w/Scope Dipstick Dipstick Cont'd Micro  Color: Amber Bilirubin: Neg mg/dL WBC/hpf: 6 - 60/AVW  Appearance: Slightly Cloudy Ketones: Trace mg/dL RBC/hpf: NS (Not Seen)  Specific Gravity: 1.020 Blood: Neg ery/uL Bacteria: Mod (26-50/hpf)  pH: 5.5 Protein: 1+ mg/dL Cystals: Amorph Urates  Glucose: Neg mg/dL Urobilinogen: 1.0 mg/dL Casts: Mixed casts  Nitrites: Neg Trichomonas: Not Present    Leukocyte Esterase: 1+ leu/uL Mucous: Present      Epithelial Cells: 0 - 5/hpf      Yeast: NS (Not Seen)      Sperm: Not Present    Notes: hyaline and granular casts present    ASSESSMENT:      ICD-10 Details  1 GU:   Ureteral calculus - N20.1 Right, Acute, Complicated Injury  2   Flank Pain - R10.84 Right, Acute, Complicated Injury   PLAN:           Orders Labs Urine  Culture  X-Rays: Renal Ultrasound (Limited) - right          Schedule Return Visit/Planned Activity: Next Available Appointment - Follow up MD          Document Letter(s):  Created for Patient: Clinical Summary         Notes:   Ultrasound did not show any obvious evidence of obstructive uropathy. Given the faint opacity and small size of the previously identified ureteral calculi correlated with her body habitus I did not think a KUB would be helpful so it was deferred. Patient remains symptomatic despite the reassuring findings today. There is also possibility that her symptoms may be driven by a non urological source. Close follow-up with her other providers also encouraged. I am going to send a message to Dr. Marlou Porch for him to review imaging and office visit notes. I told her he may want to take her for ureteroscopy as she has required this in the past but observation may also be warranted given the nonobstructive nature of prior findings. For now she will continue remaining well-hydrated and nourished, continue monitoring for any interval stone material passage. She will remain on tamsulosin for now as well. She has pain and antinausea medication to take if needed. ED follow-up instructions also provided. I did send a precautionary urine culture today.

## 2023-08-10 NOTE — Anesthesia Postprocedure Evaluation (Signed)
Anesthesia Post Note  Patient: [name]  Procedure(s) Performed: CYSTOSCOPY, RIGHT RETROGRADE PYELOGRAM, RIGHT URETEROSCOPY, BASKET EXTRACTION, RIGHT URETERAL STENT PLACEMENT (Right)     Patient location during evaluation: PACU Anesthesia Type: General Level of consciousness: awake and alert Pain management: pain level controlled Vital Signs Assessment: post-procedure vital signs reviewed and stable Respiratory status: spontaneous breathing, nonlabored ventilation, respiratory function stable and patient connected to nasal cannula oxygen Cardiovascular status: blood pressure returned to baseline and stable Postop Assessment: no apparent nausea or vomiting Anesthetic complications: no   No notable events documented.  Last Vitals:  Vitals:   08/10/23 1215 08/10/23 1245  BP: 119/83 (!) 157/105  Pulse: 69 69  Resp: 16   Temp:  36.7 C  SpO2: 91% 96%    Last Pain:  Vitals:   08/10/23 1215  TempSrc:   PainSc: 4                  Trevor Iha

## 2023-08-10 NOTE — Op Note (Signed)
Preoperative diagnosis:  Right renal colic  Postoperative diagnosis:  Right nonobstructing kidney stone  Procedure: Cystoscopy, right retrograde pyelogram with interpretation Right ureteroscopy and stone basket removal Right ureteral stent placement  Surgeon: [name], MD  Anesthesia: General  Complications: None  Intraoperative findings:  1: The retrograde pyelogram demonstrated a fairly normal caliber ureter with no significant filling defects or other abnormalities.  There was no hydroureteronephrosis. 2.:  The patient had no stone in the distal ureter as seen on her CAT scan several weeks prior.  She did have a narrowing in that area that was difficult to pass but ultimately was able to pass with some pressure from the ureteroscope without requiring dilation.  The remaining ureter was normal.  There was a small stone in the lower pole calyx attached to the papilla which I was able to grasp with the basket and removed without fragmentation. 3.:  Right 24 cm time 6 French double-J ureteral stent was placed  EBL: Minimal  Specimens: Right kidney stone  Indication: [name] is a 62 y.o. patient with history of right renal colic and right-sided flank and abdominal pain noted to have a 2 mm stone in the right distal ureter on her most recent CT scan.  After reviewing the management options for treatment, he elected to proceed with the above surgical procedure(s). We have discussed the potential benefits and risks of the procedure, side effects of the proposed treatment, the likelihood of the patient achieving the goals of the procedure, and any potential problems that might occur during the procedure or recuperation. Informed consent has been obtained.  Description of procedure: Consent was obtained the preoperative holding area.  She was then brought back to the operating room placed on table in supine position.  General esthesia was then induced endotracheal tube was  inserted.  She was placed in the dorsolithotomy position and prepped and draped in the routine sterile fashion.  Timeout subsequently performed.  21 French 2 degree cystoscope was gently passed through the patient's urethra and in the bladder under vision guidance.  Cystoscopy was performed in 3 and 6 degree manner demonstrating no significant abnormalities with normal mucosa and orthotopic ureteral orifice ease.  Using a 5 [place] open-ended ureteral catheter Omnipaque contrast was injected into the right distal ureter and a retrograde pyelogram was performed with the above findings.  I then advanced a wire up through the open-ended catheter and into the right renal pelvis removing the catheter and the scope over the wire.  I then used a flexible ureteroscope and advanced it into through the patient's bladder and was able to easily cannulate the right ureteral orifice.  Once through the intramural ureter I was unable to advance beyond the distal ureter because of a narrowing.  I used the second wire and advanced it up through the scope and then was able to railroad through this area with some gentle pressure between the 2 wires.  Once I was in the mid ureter I moved the second wire from the scope and advanced the ureteroscope up into the right renal pelvis.  Pyeloscopy was subsequently performed.  There were no free-floating stones, but there was a very small stone in the left lower pole attached to the calyx.  I was able to pull this off with the basket and then removed it without laser fragmentation.  Inspection of the ureter on the way out demonstrated some mild trauma in the area where the narrowing was, but the remainder of  the ureter was intact.  There were no ureteral disruptions.  Once the stone was completely removed I advanced a 24 cm time 6 French double-J ureteral stent over the wire and into the right renal pelvis.  Once it was noted to be well within the renal pelvis I slowly backed out the wire  while advancing the stent up to the urethral meatus.  Once the wire was completely removed the stent curl nicely into the patient's bladder.  I subsequently advanced a 21 French cystoscope sheath to drain the bladder and ensure that the tip of the stent was all the way into the patient's bladder.  This was confirmed on fluoroscopy.  I then pulled the stent tether through the patient's urethra and attached it to the mons pubis.  The patient was subsequently extubated and returned the PACU in stable condition.  Disposition: The patient is being instructed to remove her stent in 5 days.  She will follow-up with me with a renal ultrasound in 6 weeks.

## 2023-08-10 NOTE — Transfer of Care (Signed)
Immediate Anesthesia Transfer of Care Note  Patient: [name]  Procedure(s) Performed: CYSTOSCOPY, RIGHT RETROGRADE PYELOGRAM, RIGHT URETEROSCOPY, BASKET EXTRACTION, RIGHT URETERAL STENT PLACEMENT (Right)  Patient Location: PACU  Anesthesia Type:General  Level of Consciousness: sedated  Airway & Oxygen Therapy: Patient Spontanous Breathing and Patient connected to face mask oxygen  Post-op Assessment: Report given to RN and Post -op Vital signs reviewed and stable  Post vital signs: Reviewed and stable  Last Vitals:  Vitals Value Taken Time  BP 118/64 08/10/23 1131  Temp    Pulse 66 08/10/23 1137  Resp 20 08/10/23 1137  SpO2 94 % 08/10/23 1137  Vitals shown include unfiled device data.  Last Pain:  Vitals:   08/10/23 0802  TempSrc:   PainSc: 0-No pain      Patients Stated Pain Goal: 3 (08/10/23 0802)  Complications: No notable events documented.

## 2023-08-10 NOTE — Anesthesia Procedure Notes (Signed)
Procedure Name: LMA Insertion Date/Time: 08/10/2023 10:34 AM  Performed by: Doran Clay, CRNAPre-anesthesia Checklist: Patient identified, Emergency Drugs available, Suction available, Patient being monitored and Timeout performed Patient Re-evaluated:Patient Re-evaluated prior to induction Oxygen Delivery Method: Circle system utilized Preoxygenation: Pre-oxygenation with 100% oxygen Induction Type: IV induction LMA: LMA with gastric port inserted LMA Size: 4.0 Tube type: Oral Number of attempts: 1 Placement Confirmation: positive ETCO2 and breath sounds checked- equal and bilateral Tube secured with: Tape Dental Injury: Teeth and Oropharynx as per pre-operative assessment

## 2023-08-10 NOTE — Anesthesia Procedure Notes (Signed)
Date/Time: 08/10/2023 11:23 AM  Performed by: Doran Clay, CRNAOxygen Delivery Method: Simple face mask

## 2023-08-10 NOTE — Discharge Instructions (Signed)
DISCHARGE INSTRUCTIONS FOR KIDNEY STONE/URETERAL STENT   MEDICATIONS:  1.  Resume all your other meds from home. 2. Pyridium is to help with the burning/stinging when you urinate. 3. Take Cipro one hour prior to removal of your stent.   ACTIVITY:  1. No strenuous activity x 1week  2. No driving while on narcotic pain medications  3. Drink plenty of water  4. Continue to walk at home - you can still get blood clots when you are at home, so keep active, but don't over do it.  5. May return to work/school tomorrow or when you feel ready   BATHING:  1. You can shower and we recommend daily showers  2. You have a string coming from your urethra: The stent string is attached to your ureteral stent. Do not pull on this.   SIGNS/SYMPTOMS TO CALL:  Please call us if you have a fever greater than 101.5, uncontrolled nausea/vomiting, uncontrolled pain, dizziness, unable to urinate, bloody urine, chest pain, shortness of breath, leg swelling, leg pain, redness around wound, drainage from wound, or any other concerns or questions.   You can reach [place] at [phone].   FOLLOW-UP:  1. You have an appointment in 6 weeks with a ultrasound of your kidneys prior.   2. You have a string attached to your stent, you may remove it on Monday, Oct 7th. To do this, pull the strings until the stents are completely removed. You may feel an odd sensation in your back.

## 2023-08-11 ENCOUNTER — Encounter (HOSPITAL_COMMUNITY): Payer: Self-pay | Admitting: Urology

## 2023-08-16 ENCOUNTER — Other Ambulatory Visit: Payer: Self-pay | Admitting: Internal Medicine

## 2023-08-16 DIAGNOSIS — I10 Essential (primary) hypertension: Secondary | ICD-10-CM

## 2023-08-18 ENCOUNTER — Ambulatory Visit: Payer: Medicare Other | Admitting: Nurse Practitioner

## 2023-08-18 ENCOUNTER — Ambulatory Visit: Payer: Self-pay | Admitting: Nurse Practitioner

## 2023-08-18 LAB — CALCULI, WITH PHOTOGRAPH (CLINICAL LAB)
Calcium Oxalate Monohydrate: 100 %
Weight Calculi: 10 mg

## 2023-08-26 ENCOUNTER — Inpatient Hospital Stay
Admission: RE | Admit: 2023-08-26 | Discharge: 2023-08-26 | Discharge status: Home / Self Care | Payer: 59 | Source: Ambulatory Visit | Attending: Family Medicine

## 2023-08-26 ENCOUNTER — Other Ambulatory Visit: Payer: Self-pay | Admitting: Internal Medicine

## 2023-08-26 VITALS — BP 138/80 | HR 72 | Temp 98.9°F | Resp 16

## 2023-08-26 DIAGNOSIS — B3731 Acute candidiasis of vulva and vagina: Secondary | ICD-10-CM

## 2023-08-26 MED ORDER — FLUCONAZOLE 150 MG PO TABS: 150.0000 mg | ORAL_TABLET | ORAL | 0 refills | Status: AC

## 2023-08-26 NOTE — ED Provider Notes (Signed)
[facility]    CSN: [id number] Arrival date & time: 08/26/23  1053      History   Chief Complaint Chief Complaint  Patient presents with   Vaginal Itching    Just got done with cephalexin and now have a yeast infection. - Entered by patient    HPI [name] is a 62 y.o. female.   Patient presenting today with several day history of white vaginal discharge, vaginal itching after finishing a course of Keflex.  She denies rashes or lesions, pelvic or abdominal pain, fever, chills.  Tends to get yeast infections after antibiotics and often will request Diflucan if she is prescribed 1 but she forgot this time.  Not trying to thing over-the-counter for symptoms.    Past Medical History:  Diagnosis Date   Arthritis    Depression    Diabetes mellitus without complication (HCC)    Fibromyalgia    GERD (gastroesophageal reflux disease)    Graves disease    s/p radioactive therapy   Heart murmur    as child   Hypercholesteremia    Hypertension    Hypothyroidism    IBS (irritable bowel syndrome)    Kidney stone    Lupus    Pancreatitis    Restless leg    Sleep apnea    Uvular swelling    pt states it happened after c-section but since then happens randomly at home.    Patient Active Problem List   Diagnosis Date Noted   Type 2 diabetes mellitus without complication, without long-term current use of insulin (HCC) 07/13/2022   Hypertension 01/06/2022   Morbid obesity (HCC) 01/06/2022   Constipation 01/05/2021   Diarrhea 10/07/2020   Pelvic cyst 02/25/2016   History of colonic polyps    Hemorrhoid    Fatty liver 12/02/2015   IBS (irritable bowel syndrome) 12/02/2015   FH: colon cancer 12/02/2015   Abdominal pain, epigastric 03/14/2013   Rectal bleeding 02/04/2013   Thyrotoxicosis 07/29/2009   GERD 07/29/2009   PROCTITIS 07/29/2009   FATTY LIVER DISEASE 07/29/2009   WEIGHT LOSS 07/29/2009   IRRITABLE BOWEL SYNDROME, HX OF 07/29/2009    Past  Surgical History:  Procedure Laterality Date   BILATERAL SALPINGOOPHORECTOMY  2005   [facility], [place] done by Dr. Despina Hidden for AUB, chronic pelvic pain   CESAREAN SECTION     x2   CHOLECYSTECTOMY     COLONOSCOPY  2006   RMR: internal/external hemorrhoids, proctitis   COLONOSCOPY N/A 02/06/2013   RMR: anal canal hemorrhoids, inadequate prep. Needs screening again in April 2015   COLONOSCOPY WITH PROPOFOL N/A 12/24/2015   Dr. Jena Gauss: redundant colonic, colonic diverticulosis, cecal AVMs, tubular adenoma, surveillance in 5 years   COLONOSCOPY WITH PROPOFOL N/A 10/29/2020    non-bleeding internal hemorrhoids, one 6 mm polyp in mid transverse colon, otherwise normal. Tubular adenoma, 5 year surveillance.    CYSTOSCOPY WITH RETROGRADE PYELOGRAM, URETEROSCOPY AND STENT PLACEMENT Right 01/21/2016   Procedure: RIGHT RETROGRADE, PYLEGRAM, RIGHT URETEROSCOPY LITHOTRIPSY WITH STONE  BASKET EXTRACTION, STENT PLACEMENT;  Surgeon: [name], MD;  Location: [facility];  Service: Urology;  Laterality: Right;   CYSTOSCOPY/URETEROSCOPY/HOLMIUM LASER/STENT PLACEMENT Right 08/10/2023   Procedure: CYSTOSCOPY, RIGHT RETROGRADE PYELOGRAM, RIGHT URETEROSCOPY, BASKET EXTRACTION, RIGHT URETERAL STENT PLACEMENT;  Surgeon: [name], MD;  Location: [facility];  Service: Urology;  Laterality: Right;  60 MINUTES   ESOPHAGOGASTRODUODENOSCOPY (EGD) WITH PROPOFOL N/A 10/29/2020   normal esophagus, gastric petechia, normal duodenum.    LAPAROSCOPIC CHOLECYSTECTOMY  W/ CHOLANGIOGRAPHY  2004   POLYPECTOMY  12/24/2015   Procedure: POLYPECTOMY;  Surgeon: Corbin Ade, MD;  Location: [facility];  Service: Endoscopy;;   POLYPECTOMY  10/29/2020   Procedure: POLYPECTOMY;  Surgeon: Corbin Ade, MD;  Location: [facility];  Service: Endoscopy;;   SUPRACERVICAL ABDOMINAL HYSTERECTOMY  2005    OB History   No obstetric history on file.      Home Medications    Prior to Admission medications   Medication Sig Start Date End  Date Taking? Authorizing Provider  acetaminophen (TYLENOL) 500 MG tablet Take 500-1,000 mg by mouth every 6 (six) hours as needed (pain.).   Yes [provider]  amLODipine (NORVASC) 10 MG tablet Take 1 tablet (10 mg total) by mouth daily. Patient taking differently: Take 10 mg by mouth daily in the afternoon. 11/23/22  Yes Orbie Pyo, MD  atorvastatin (LIPITOR) 40 MG tablet TAKE 1/2 TABLET BY MOUTH DAILY 07/05/23  Yes Orbie Pyo, MD  dexlansoprazole (DEXILANT) 60 MG capsule Take 1 capsule (60 mg total) by mouth daily. Patient taking differently: Take 60 mg by mouth daily in the afternoon. 01/05/21  Yes Gelene Mink, NP  fluconazole (DIFLUCAN) 150 MG tablet Take 1 tablet (150 mg total) by mouth every other day. 08/26/23  Yes Particia Nearing, PA-C  FLUoxetine (PROZAC) 40 MG capsule Take 40 mg by mouth daily in the afternoon.   Yes [provider]  hydrochlorothiazide (HYDRODIURIL) 25 MG tablet Take 25 mg by mouth daily in the afternoon.   Yes [provider]  hydroxychloroquine (PLAQUENIL) 200 MG tablet Take 200 mg by mouth daily in the afternoon.   Yes [provider]  levothyroxine (SYNTHROID) 137 MCG tablet Take 137 mcg by mouth daily before breakfast.    Yes [provider]  lisinopril (ZESTRIL) 40 MG tablet TAKE 1 TABLET BY MOUTH EVERY DAY 08/16/23  Yes Orbie Pyo, MD  loratadine (CLARITIN) 10 MG tablet Take 10 mg by mouth daily in the afternoon.   Yes [provider]  MAGNESIUM PO Take 1 tablet by mouth daily in the afternoon.   Yes [provider]  metFORMIN (GLUCOPHAGE) 500 MG tablet Take 500 mg by mouth daily in the afternoon.   Yes [provider]  Probiotic Product (PROBIOTIC PO) Take 1 capsule by mouth daily in the afternoon.   Yes [provider]  RESTASIS 0.05 % ophthalmic emulsion Place 1 drop into both eyes daily as needed for dry eyes. 08/04/21  Yes [provider]   Semaglutide, 2 MG/DOSE, (OZEMPIC, 2 MG/DOSE,) 8 MG/3ML SOPN Inject 2 mg into the skin once a week. Patient taking differently: Inject 2 mg into the skin every Monday. 07/28/23  Yes Orbie Pyo, MD  vitamin E 180 MG (400 UNITS) capsule Take 400 Units by mouth daily in the afternoon.   Yes [provider]  albuterol (VENTOLIN HFA) 108 (90 Base) MCG/ACT inhaler Inhale 2 puffs into the lungs daily as needed for shortness of breath. Patient not taking: Reported on 08/04/2023 11/25/21   [provider]  aspirin EC 81 MG tablet Take 81 mg by mouth daily in the afternoon.    [provider]  oxyCODONE-acetaminophen (PERCOCET/ROXICET) 5-325 MG tablet Take 1 tablet by mouth daily as needed (severe pain.). Patient not taking: Reported on 08/04/2023 07/05/23   [provider]  phenazopyridine (PYRIDIUM) 200 MG tablet Take 1 tablet (200 mg total) by mouth 3 (three) times daily  as needed for pain. 08/10/23   [name], MD  tamsulosin (FLOMAX) 0.4 MG CAPS capsule Take 0.4 mg by mouth every evening.    [provider]    Family History Family History  Adopted: Yes  Problem Relation Age of Onset   Colon cancer Father        patient was adopted, but she knows birth father. Believes he had colon cancer   Cancer Neg Hx     Social History Social History   Tobacco Use   Smoking status: Never   Smokeless tobacco: Never   Tobacco comments:    Never smoked  Vaping Use   Vaping status: Never Used  Substance Use Topics   Alcohol use: No    Alcohol/week: 0.0 standard drinks of alcohol   Drug use: No     Allergies   Patient has no known allergies.   Review of Systems Review of Systems PER HPI  Physical Exam Triage Vital Signs ED Triage Vitals [08/26/23 1116]  Encounter Vitals Group     BP 138/80     Systolic BP Percentile      Diastolic BP Percentile      Pulse Rate 72     Resp 16     Temp 98.9 F (37.2 C)     Temp Source Oral      SpO2 93 %     Weight      Height      Head Circumference      Peak Flow      Pain Score      Pain Loc      Pain Education      Exclude from Growth Chart    No data found.  Updated Vital Signs BP 138/80 (BP Location: Left Arm)   Pulse 72   Temp 98.9 F (37.2 C) (Oral)   Resp 16   SpO2 93%   Visual Acuity Right Eye Distance:   Left Eye Distance:   Bilateral Distance:    Right Eye Near:   Left Eye Near:    Bilateral Near:     Physical Exam Vitals and nursing note reviewed.  Constitutional:      Appearance: Normal appearance. She is not ill-appearing.  HENT:     Head: Atraumatic.  Eyes:     Extraocular Movements: Extraocular movements intact.     Conjunctiva/sclera: Conjunctivae normal.  Cardiovascular:     Rate and Rhythm: Normal rate and regular rhythm.     Heart sounds: Normal heart sounds.  Pulmonary:     Effort: Pulmonary effort is normal.     Breath sounds: Normal breath sounds.  Abdominal:     General: Bowel sounds are normal. There is no distension.     Palpations: Abdomen is soft.     Tenderness: There is no abdominal tenderness. There is no right CVA tenderness, left CVA tenderness or guarding.  Genitourinary:    Comments: GU exam declined Musculoskeletal:        General: Normal range of motion.     Cervical back: Normal range of motion and neck supple.  Skin:    General: Skin is warm and dry.  Neurological:     Mental Status: She is alert and oriented to person, place, and time.  Psychiatric:        Mood and Affect: Mood normal.        Thought Content: Thought content normal.        Judgment: Judgment normal.  UC Treatments / Results  Labs (all labs ordered are listed, but only abnormal results are displayed) Labs Reviewed - No data to display  EKG   Radiology No results found.  Procedures Procedures (including critical care time)  Medications Ordered in UC Medications - No data to display  Initial Impression / Assessment  and Plan / UC Course  I have reviewed the triage vital signs and the nursing notes.  Pertinent labs & imaging results that were available during my care of the patient were reviewed by me and considered in my medical decision making (see chart for details).     Vitals and exam reassuring today, suspect yeast vaginitis.  She declines vaginal swab for confirmation today.  Trial Diflucan, over-the-counter supportive remedies and follow-up for worsening symptoms.  Final Clinical Impressions(s) / UC Diagnoses   Final diagnoses:  Yeast vaginitis   Discharge Instructions   None    ED Prescriptions     Medication Sig Dispense Auth. Provider   fluconazole (DIFLUCAN) 150 MG tablet Take 1 tablet (150 mg total) by mouth every other day. 3 tablet Particia Nearing, [place]      PDMP not reviewed this encounter.   Particia Nearing, [place] 08/26/23 1303

## 2023-08-26 NOTE — ED Triage Notes (Signed)
Pt reports vaginal discharge x 3 days. Pt just finish taking antibiotics.

## 2023-09-29 ENCOUNTER — Other Ambulatory Visit: Payer: Self-pay | Admitting: Internal Medicine

## 2023-09-29 DIAGNOSIS — E1169 Type 2 diabetes mellitus with other specified complication: Secondary | ICD-10-CM

## 2023-12-26 ENCOUNTER — Encounter (HOSPITAL_BASED_OUTPATIENT_CLINIC_OR_DEPARTMENT_OTHER): Payer: Self-pay | Admitting: Emergency Medicine

## 2023-12-26 ENCOUNTER — Emergency Department (HOSPITAL_BASED_OUTPATIENT_CLINIC_OR_DEPARTMENT_OTHER)
Admission: EM | Admit: 2023-12-26 | Discharge: 2023-12-26 | Discharge status: Against Medical Advice | Payer: 59 | Attending: Emergency Medicine | Admitting: Emergency Medicine

## 2023-12-26 ENCOUNTER — Other Ambulatory Visit: Payer: Self-pay

## 2023-12-26 ENCOUNTER — Emergency Department (HOSPITAL_BASED_OUTPATIENT_CLINIC_OR_DEPARTMENT_OTHER): Payer: 59

## 2023-12-26 DIAGNOSIS — M79652 Pain in left thigh: Secondary | ICD-10-CM | POA: Diagnosis not present

## 2023-12-26 DIAGNOSIS — Z5321 Procedure and treatment not carried out due to patient leaving prior to being seen by health care provider: Secondary | ICD-10-CM | POA: Diagnosis not present

## 2023-12-26 DIAGNOSIS — R2242 Localized swelling, mass and lump, left lower limb: Secondary | ICD-10-CM | POA: Diagnosis present

## 2023-12-26 NOTE — ED Triage Notes (Signed)
 C/o left thigh swelling and pain x 3 days. Denies any injury to area. Area ot warm to touch.

## 2024-01-16 ENCOUNTER — Other Ambulatory Visit: Payer: Self-pay | Admitting: Internal Medicine

## 2024-05-08 ENCOUNTER — Encounter (INDEPENDENT_AMBULATORY_CARE_PROVIDER_SITE_OTHER): Payer: Self-pay | Admitting: *Deleted

## 2024-07-08 ENCOUNTER — Other Ambulatory Visit: Payer: Self-pay | Admitting: Internal Medicine

## 2024-07-08 DIAGNOSIS — I10 Essential (primary) hypertension: Secondary | ICD-10-CM

## 2024-07-11 ENCOUNTER — Other Ambulatory Visit: Payer: Self-pay | Admitting: Internal Medicine

## 2024-07-11 DIAGNOSIS — E1169 Type 2 diabetes mellitus with other specified complication: Secondary | ICD-10-CM

## 2024-10-21 ENCOUNTER — Other Ambulatory Visit: Payer: Self-pay

## 2024-10-23 MED ORDER — AMLODIPINE BESYLATE 10 MG PO TABS: 10.0000 mg | ORAL_TABLET | Freq: Every day | ORAL | 0 refills | Status: DC

## 2024-11-16 ENCOUNTER — Other Ambulatory Visit: Payer: Self-pay | Admitting: Internal Medicine

## 2024-11-20 ENCOUNTER — Other Ambulatory Visit: Payer: Self-pay | Admitting: Internal Medicine

## 2024-12-05 ENCOUNTER — Other Ambulatory Visit: Payer: Self-pay | Admitting: Internal Medicine
